# Patient Record
Sex: Female | Born: 1935 | Race: White | Hispanic: No | Smoking: Former smoker
Health system: Southern US, Community
[De-identification: ages and names within clinical notes are randomized; demographics above are authoritative.]

## PROBLEM LIST (undated history)

## (undated) DIAGNOSIS — H409 Unspecified glaucoma: Secondary | ICD-10-CM

## (undated) DIAGNOSIS — B029 Zoster without complications: Secondary | ICD-10-CM

## (undated) DIAGNOSIS — H269 Unspecified cataract: Secondary | ICD-10-CM

## (undated) DIAGNOSIS — M8580 Other specified disorders of bone density and structure, unspecified site: Secondary | ICD-10-CM

## (undated) DIAGNOSIS — M4800 Spinal stenosis, site unspecified: Secondary | ICD-10-CM

## (undated) DIAGNOSIS — I10 Essential (primary) hypertension: Secondary | ICD-10-CM

## (undated) DIAGNOSIS — E039 Hypothyroidism, unspecified: Secondary | ICD-10-CM

## (undated) DIAGNOSIS — H3530 Unspecified macular degeneration: Secondary | ICD-10-CM

## (undated) DIAGNOSIS — E785 Hyperlipidemia, unspecified: Secondary | ICD-10-CM

## (undated) DIAGNOSIS — M1990 Unspecified osteoarthritis, unspecified site: Secondary | ICD-10-CM

## (undated) HISTORY — DX: Unspecified glaucoma: H40.9

## (undated) HISTORY — DX: Unspecified macular degeneration: H35.30

## (undated) HISTORY — DX: Zoster without complications: B02.9

## (undated) HISTORY — PX: OTHER SURGICAL HISTORY: SHX169

## (undated) HISTORY — DX: Other specified disorders of bone density and structure, unspecified site: M85.80

## (undated) HISTORY — DX: Unspecified cataract: H26.9

## (undated) HISTORY — DX: Spinal stenosis, site unspecified: M48.00

## (undated) HISTORY — PX: TUBAL LIGATION: SHX77

## (undated) HISTORY — DX: Hyperlipidemia, unspecified: E78.5

## (undated) HISTORY — DX: Essential (primary) hypertension: I10

## (undated) HISTORY — PX: EYE SURGERY: SHX253

## (undated) HISTORY — PX: APPENDECTOMY: SHX54

---

## 1998-01-28 ENCOUNTER — Encounter: Payer: Self-pay | Admitting: Obstetrics and Gynecology

## 1998-01-28 ENCOUNTER — Ambulatory Visit (HOSPITAL_COMMUNITY): Admission: RE | Admit: 1998-01-28 | Discharge: 1998-01-28 | Payer: Self-pay | Admitting: Obstetrics and Gynecology

## 1998-02-19 ENCOUNTER — Other Ambulatory Visit: Admission: RE | Admit: 1998-02-19 | Discharge: 1998-02-19 | Payer: Self-pay | Admitting: Obstetrics and Gynecology

## 1999-03-23 ENCOUNTER — Encounter: Payer: Self-pay | Admitting: Obstetrics and Gynecology

## 1999-03-23 ENCOUNTER — Ambulatory Visit (HOSPITAL_COMMUNITY): Admission: RE | Admit: 1999-03-23 | Discharge: 1999-03-23 | Payer: Self-pay | Admitting: Obstetrics and Gynecology

## 1999-05-18 ENCOUNTER — Other Ambulatory Visit: Admission: RE | Admit: 1999-05-18 | Discharge: 1999-05-18 | Payer: Self-pay | Admitting: Obstetrics and Gynecology

## 1999-07-05 ENCOUNTER — Encounter (INDEPENDENT_AMBULATORY_CARE_PROVIDER_SITE_OTHER): Payer: Self-pay | Admitting: Specialist

## 1999-07-05 ENCOUNTER — Ambulatory Visit (HOSPITAL_COMMUNITY): Admission: RE | Admit: 1999-07-05 | Discharge: 1999-07-05 | Payer: Self-pay | Admitting: Obstetrics and Gynecology

## 2000-01-05 ENCOUNTER — Encounter
Admission: RE | Admit: 2000-01-05 | Discharge: 2000-01-23 | Payer: Self-pay | Admitting: Physical Medicine and Rehabilitation

## 2000-05-01 ENCOUNTER — Encounter: Payer: Self-pay | Admitting: Obstetrics and Gynecology

## 2000-05-01 ENCOUNTER — Ambulatory Visit (HOSPITAL_COMMUNITY): Admission: RE | Admit: 2000-05-01 | Discharge: 2000-05-01 | Payer: Self-pay | Admitting: Obstetrics and Gynecology

## 2000-06-27 ENCOUNTER — Other Ambulatory Visit: Admission: RE | Admit: 2000-06-27 | Discharge: 2000-06-27 | Payer: Self-pay | Admitting: Obstetrics and Gynecology

## 2001-06-04 ENCOUNTER — Encounter: Payer: Self-pay | Admitting: Family Medicine

## 2001-06-04 ENCOUNTER — Ambulatory Visit (HOSPITAL_COMMUNITY): Admission: RE | Admit: 2001-06-04 | Discharge: 2001-06-04 | Payer: Self-pay | Admitting: Family Medicine

## 2001-07-03 ENCOUNTER — Other Ambulatory Visit: Admission: RE | Admit: 2001-07-03 | Discharge: 2001-07-03 | Payer: Self-pay | Admitting: Family Medicine

## 2001-07-24 ENCOUNTER — Other Ambulatory Visit: Admission: RE | Admit: 2001-07-24 | Discharge: 2001-07-24 | Payer: Self-pay | Admitting: General Surgery

## 2001-07-26 ENCOUNTER — Ambulatory Visit (HOSPITAL_COMMUNITY): Admission: RE | Admit: 2001-07-26 | Discharge: 2001-07-26 | Payer: Self-pay | Admitting: *Deleted

## 2001-09-16 ENCOUNTER — Encounter: Payer: Self-pay | Admitting: General Surgery

## 2001-09-18 ENCOUNTER — Encounter (INDEPENDENT_AMBULATORY_CARE_PROVIDER_SITE_OTHER): Payer: Self-pay | Admitting: Specialist

## 2001-09-18 ENCOUNTER — Ambulatory Visit (HOSPITAL_COMMUNITY): Admission: RE | Admit: 2001-09-18 | Discharge: 2001-09-19 | Payer: Self-pay | Admitting: General Surgery

## 2002-06-27 ENCOUNTER — Ambulatory Visit (HOSPITAL_COMMUNITY): Admission: RE | Admit: 2002-06-27 | Discharge: 2002-06-27 | Payer: Self-pay | Admitting: Family Medicine

## 2002-06-27 ENCOUNTER — Encounter: Payer: Self-pay | Admitting: Family Medicine

## 2003-07-20 ENCOUNTER — Ambulatory Visit (HOSPITAL_COMMUNITY): Admission: RE | Admit: 2003-07-20 | Discharge: 2003-07-20 | Payer: Self-pay | Admitting: Family Medicine

## 2003-11-06 ENCOUNTER — Other Ambulatory Visit: Admission: RE | Admit: 2003-11-06 | Discharge: 2003-11-06 | Payer: Self-pay | Admitting: Family Medicine

## 2004-08-04 ENCOUNTER — Ambulatory Visit (HOSPITAL_COMMUNITY): Admission: RE | Admit: 2004-08-04 | Discharge: 2004-08-04 | Payer: Self-pay | Admitting: Family Medicine

## 2005-08-14 ENCOUNTER — Ambulatory Visit (HOSPITAL_COMMUNITY): Admission: RE | Admit: 2005-08-14 | Discharge: 2005-08-14 | Payer: Self-pay | Admitting: Family Medicine

## 2005-11-21 ENCOUNTER — Other Ambulatory Visit: Admission: RE | Admit: 2005-11-21 | Discharge: 2005-11-21 | Payer: Self-pay | Admitting: Family Medicine

## 2006-08-21 ENCOUNTER — Ambulatory Visit (HOSPITAL_COMMUNITY): Admission: RE | Admit: 2006-08-21 | Discharge: 2006-08-21 | Payer: Self-pay | Admitting: Family Medicine

## 2007-02-03 ENCOUNTER — Inpatient Hospital Stay (HOSPITAL_COMMUNITY): Admission: EM | Admit: 2007-02-03 | Discharge: 2007-02-12 | Payer: Self-pay | Admitting: Emergency Medicine

## 2007-10-01 ENCOUNTER — Ambulatory Visit (HOSPITAL_COMMUNITY): Admission: RE | Admit: 2007-10-01 | Discharge: 2007-10-01 | Payer: Self-pay | Admitting: Family Medicine

## 2008-10-07 ENCOUNTER — Ambulatory Visit (HOSPITAL_COMMUNITY): Admission: RE | Admit: 2008-10-07 | Discharge: 2008-10-07 | Payer: Self-pay | Admitting: Family Medicine

## 2009-10-14 ENCOUNTER — Ambulatory Visit (HOSPITAL_COMMUNITY): Admission: RE | Admit: 2009-10-14 | Discharge: 2009-10-14 | Payer: Self-pay | Admitting: Family Medicine

## 2010-07-26 NOTE — H&P (Signed)
[name], Wheeler NO.:  [id number]   MEDICAL RECORD NO.:  [id number]          PATIENT TYPE:  INP   LOCATION:  5731                         FACILITY:  [facility]   PHYSICIAN:  Nicole Sportsman, MD     DATE OF BIRTH:  10-03-1930   DATE OF ADMISSION:  02/03/2007  DATE OF DISCHARGE:                              HISTORY & PHYSICAL   PRIMARY CARE PHYSICIAN:  Nicole Wheeler, M.D.   REQUESTING PHYSICIAN:  Nicole Wheeler, M.D.   SURGEON:  Nicole Sportsman, MD   OTHER SURGEONS:  Nicole Wheeler at [facility] Wheeler  in [place], Nicole Wheeler.   REASON FOR VISIT:  Nausea, vomiting, probable bowel obstruction.   HISTORY OF PRESENT ILLNESS:  Nicole Wheeler is a 77 year old pleasant female  who on vacation had appendicitis and had a laparoscopic appendectomy by  Dr. Malena Edman on January 28, 2007.  There were a little bit of fecal  contents during surgery, but otherwise this was able to be done  laparoscopically.  She was able to be discharged the following day.  [place]   Since her discharge, she has had a few small bowel movements.  She  initially had some flatus the first few days after surgery, but has not  had any flatus for several days.  Her appetite has been fair and has not  improved.  She has had a little bit of nausea that has been relatively  mild.  However, her abdomen started feeling more uncomfortable with  increasing distention and she started having worsening nausea.  She  began to throw up and have severe pain and came to the emergency room.  She has been hydrated and NG tube placed after 3-way views were  concerning for a small bowel obstruction.   She denies any sick contacts or travel history.  She normally has a  bowel movement every day.  She has had a colonoscopy several years ago  that was completely negative.  No other history of any abdominal  surgeries.  She has never had  anything like this before.   PAST MEDICAL HISTORY:  1. Hypothyroidism, secondary to thyroidectomy for nodules.  2. Osteoporosis.   PAST SURGICAL HISTORY:  1. She had a laparoscopic appendectomy on January 28, 2007.  2. She had a left thyroidectomy in 2003.  3. She has had hysteroscopy in 2001.  She has not had any other      abdominal surgeries.   SOCIAL HISTORY:  No tobacco, alcohol or drug use.  She is in a stable  relationship.  [place] went [place]  vacation.   FAMILY HISTORY:  Family history is negative for any significant  cardiopulmonary or bowel disease or gastrointestinal disease that she  can recall.   ALLERGIES:  None.   MEDICATIONS:  1. Medications include Synthroid 125 mcg daily.  2. Evista daily.  3. Calcium supplements 600 mg daily.  4. Glucosamine daily.  5. Aspirin 81 mg daily.  6.  Multivitamin daily.  7. Fish oil daily.  8. She is also on Levaquin 500 mg daily and  9. Flagyl 500 mg p.o. q.6h.; apparently she was nearly completing      that.  She was also on some  10.Tylox p.r.n.   REVIEW OF SYSTEMS:  Noted as per HPI.  Otherwise, GENERAL:  No fevers,  chills, sweats.  Is otherwise negative.  Otologic, ENT, cardiac,  pulmonary, musculoskeletal, neurological, psychiatric, allergic, heme,  lymph, hepatic, renal are negative.  ENDOCRINE:  Hypothyroidism, on  Synthroid, stable status post resections.  Osteoporosis.  GI:  As noted  above.  No hematochezia or melena.  No dysphagia to solids; some early  satiety and bloating recently.  No severe heartburn or reflux.  BREAST/GYN:  Otherwise negative.   PHYSICAL EXAMINATION:  VITAL SIGNS:  Her temperature is 97.8, pulse 80,  90s.  Blood pressure is 150/84 with 10 out of 10 pain; now it is 126/56  with about 3 to 6 out of 10 pain, given narcotics and NG tube.  Respirations initially 20, now down to 12.  GENERAL:  She is a well-developed, well-nourished female in no acute   distress.  HEENT:  She is normocephalic with no facial asymmetry.  She has an NG  tube in place with obvious dark, bilious output.  Nasopharynx,  oropharynx otherwise clear.  Mucous membranes are dry.  NECK:  Supple, without any  masses.  Trachea is midline.  HEART:  Regular rate and rhythm, no murmurs, clicks or rubs.  CHEST:  Clear to auscultation bilaterally, no wheezes, rales or rhonchi.  No pain to rib or sternal compression.  ABDOMEN:  Moderately distended with well-healed laparoscopic incisions  in the umbilicus, suprapubic and left lower quadrant region.  There is  minimal ecchymosis and no evidence of any abscess or hernia.  She has  some mild tenderness, but certainly no peritonitis or guarding.  GENITALIA:  Normal external female genitalia, no inguinal hernias.  RECTAL:  Refused, per patient request.  EXTREMITIES:  No clubbing, cyanosis or edema.  MUSCULOSKELETAL:  Full range of motion shoulders, elbows, wrists as well  as hip, knees and ankles.  A little bit of stiffness at the knees.  PSYCHIATRIC:  No evidence of any dementia, delirium, psychosis or  paranoia. At least average intelligence and good insight.  NEUROLOGIC:  Cranial nerves II-XII are intact.  Hand grip is 5/5 and equal and  symmetrical.  BREASTS:  No obvious sores or lesion or nipple discharge.  LYMPH NODE:  No head, neck or axillary, groin lymphadenopathy.   STUDIES:  Her albumin is 2.5.  The rest of her electrolytes and liver  function tests and lipase are normal.  White count is 12.7 with slight  left shift with a hemoglobin of 12.9.  A 3-way of the abdomen shows no  pneumonia, but she has numerous dilated small bowel loops with air-fluid  levels, concerning for a bowel obstruction versus ileus.  CT scan is  pending.   ASSESSMENT/PLAN:  This is a 77 year old female postop day 6 laparoscopic  appendectomy with a little bit of fecal contamination but no definite  suppuration or abscess, with nausea and  vomiting and distention  concerning for postoperative ileus versus bowel obstruction.  1. Admit.  2. IV fluids.  3. Start TNA for malnutrition.  4. CT scan of the abdomen and pelvis to rule out an abscess and      evaluate for a bowel obstruction.  5. Serial abdominal examinations.  6. IV antibiotics, make sure she is not missing any other infections.  7. Continue nasogastric tube decompression.  8. Hydrate as needed.  9. Ulcer prophylaxis with proton pump inhibitor.  10.DVT prophylaxis with sequential compression devices.  11.Hold on active anticoagulation at this time.  12.Encourage early ambulation.   This plan was discussed with the patient and her husband in the presence  of the ER nurse and all agree with this plan.      Nicole Sportsman, MD  Electronically Signed     SCG/MEDQ  D:  02/03/2007  T:  02/03/2007  Job:  [id number]   cc:   Nicole B. Shon Baton, MD  Nicole Wheeler, M.D.

## 2010-07-26 NOTE — Discharge Summary (Signed)
NAMEHILIANA, Nicole Wheeler                ACCOUNT NO.:  [id number]   MEDICAL RECORD NO.:  [id number]          PATIENT TYPE:  INP   LOCATION:  5710                         FACILITY:  [facility]   PHYSICIAN:  Leonie Man, M.D.   DATE OF BIRTH:  [birth year]   DATE OF ADMISSION:  02/03/2007  DATE OF DISCHARGE:  02/12/2007                               DISCHARGE SUMMARY   PRIMARY CARE PHYSICIAN:  Dr. Rudi Heap.   OPERATIVE SURGEON:  Dr. Malena Edman at [facility] in [place], [place].   CHIEF COMPLAINT/REASON FOR ADMISSION:  The patient is a 77 year old  female patient who had acute appendicitis while on vacation in [place] and subsequently underwent laparoscopic appendectomy by Dr. Shon Baton  on November 17th.  According to the patient she states that the surgeon  told her she did have a minute amount of fecal contamination during the  surgery but otherwise had a successful surgery and recovery and was  subsequently discharged return back to [place].  Since surgery  the patient has had only a few small bowel movements.  No real flatus  over the past several days prior to her presentation, poor appetite and  nausea with increasing abdominal pain and subsequent development of  emesis several days prior to presenting to the ER.  When she presented  to the ER, she was afebrile, blood pressure was elevated but she was not  tachycardiac.  Her abdomen was distended with no bowel sounds.  Because  of her continued vomiting and ileus pattern on plain films, the ER  physician did have an NG tube placed in the patient prior to her being  evaluated by Dr. Michaell Cowing.  The patient's white count was mildly elevated  at 12,700 and a CT scan was ordered but was not resulted at time of  evaluation by Dr. Michaell Cowing.  Dr. Michaell Cowing admitted the patient with a  diagnosis of abdominal pain, leukocytosis and ileus postoperative after  laparoscopic appendectomy.   HOSPITAL  COURSE:  The patient was admitted to the general floor where  she was placed on n.p.o. status.  NG tube was continued.  IV fluid  hydration was initiated via PICC line as well as TNA was started and she  was placed on empiric Invanz for possible postoperative abscess  formation.   Subsequent CT scan did reveal dilated small bowel with a decompressed  terminal ileum consistent with partial small-bowel obstruction and  ileus.  She did have a very tiny abscess 2.1 x 1.3 cm in the upper right  pelvis with a tiny fecalith in place.  The patient was subsequently  evaluated by IR for percutaneous drainage of this area.  This area was  aspirated of a tiny amount of fluid.  A drain was not left in place and  these cultures were subsequently negative for any growth.   Over several days the patient's white count resolved.  She had  consistent problems though with abdominal distention and ileus requiring  an NG tube.  Morphine and Toradol were utilized for pain management  while the  NG tube was in place.  And she was also continued on TNA for  nutritional support while on bowel rest.   During this period of time while the patient was on bowel rest despite  abdominal distention, she was passing small amounts of flatus and small  bowel movements but she continued to have but as noted bilious output  and abnormal x-rays consistent with ileus with small bowel dilatation   By February 07, 2007 the patient's abdominal distention had improved.  She had developed bowel sounds.  Her NG tube was placed to straight  drain and she was ambulated and, by February 08, 2007, the patient was  passing flatus and having bowel movements after Dulcolax.  Her abdomen  was nondistended.  She had active bowel sounds.  Her NG tube was  discontinued and she was started on a clear liquid diet.  Over the next  several days the patient was bothered with feeling bloated and having  large volume watery diarrhea, so she kept  herself on a liquid diet  because she tolerated this better.  Stools were checked for C diff.  These were negative.  During this time period the patient had no  leukocytosis and no fever.  By February 10, 2007 the patient was  tolerating a full liquid diet and diarrhea was decreasing.  Her TNA was  discontinued.  Over the next several days the patient was advanced to a  solid diet which she tolerated.  By date of discharge her stools were  becoming more formed and less frequent stools.  Her Pincus Sanes was  discontinued 24 hours prior to discharge and she remained afebrile with  stable vital signs on the date of discharge.  Bowel sounds were present.  Her abdomen was only tender over the umbilical incision without any  evidence of seroma, drainage, redness or purulence.  Prealbumin was  checked.  Initial prealbumin was less than 10 and today's prealbumin on  discharge was 16.5.  The patient was otherwise deemed appropriate for  discharge home with plans to follow up with either Dr. Michaell Cowing or Dr. Carolynne Edouard  at [facility] and follow up with her primary care  physician as needed for her other chronic medical problems.   DISCHARGE DIAGNOSES:  1. Abdominal pain, nausea and vomiting secondary to postoperative      ileus.  2. Tiny postoperative abscess status post aspiration with no growth in      cultures.  3. Protein calorie malnutrition requiring TNA, resolving.  4. Stable hypothyroidism, on replacement medications.   DISCHARGE MEDICATIONS:  The patient will resume the following home  medications:  1. Synthroid 112 mcg daily.  2. Evista daily.  3. Calcium 600 mg daily.  4. Glucosamine daily.  5. Aspirin 81 mg daily.  6. Multiple vitamin daily.  7. Fish oil daily.  8. In addition, we have given her a prescription of Vicodin 5/325 one      to two tablets every 4 hours as needed for pain, number 30      dispensed with zero refills.   RETURN TO WORK:  Not applicable.   DIET:  No  restrictions.   WOUND CARE:  Not applicable.   ACTIVITY:  No restrictions on bathing or lifting.  She is two weeks  postoperative this point.  She has been instructed though not to drive  while taking any Vicodin.   FOLLOW-UP APPOINTMENTS:  She is to follow up with either Dr. Carolynne Edouard or Dr.  Michaell Cowing in  2-3 weeks.  She needs to call for an appointment at telephone  number is [phone].      Allison L. Rennis Harding, N.P.      Leonie Man, M.D.  Electronically Signed    ALE/MEDQ  D:  02/12/2007  T:  02/12/2007  Job:  [id number]   cc:   Ollen Gross. Vernell Morgans, M.D.  Ardeth Sportsman, MD  Ernestina Penna, M.D.  Dr. Malena Edman

## 2010-07-29 NOTE — Op Note (Signed)
[facility]  Patient:    Nicole Wheeler, Nicole Wheeler                       MRN: [id number] Proc. Date: 07/05/99 Adm. Date:  [id number] Attending:  Amanda Cockayne                           Operative Report  PREOPERATIVE DIAGNOSIS:       Thickened endometrium with spotting, negative hysterosonogram.  Patient is on Prempro.  POSTOPERATIVE DIAGNOSIS:      Thickened endometrium with spotting, negative hysterosonogram.  Patient is on Prempro.  OPERATION PERFORMED:          Hysteroscopy with observation scope and the resecting scope and resection of anterior and posterior endometrium.  SURGEON:                      Esmeralda Arthur, M.D.  ANESTHESIA:  DESCRIPTION OF PROCEDURE:     Patient was carried to the operating room and after satisfactory general anesthesia, she was placed in the lithotomy position.  She was prepped and draped as a sterile field and bladder was emptied by catheterization.  Examination revealed the uterus to feel like it was anterior.  I could feel no masses.  A weighted speculum was placed in the posterior vagina.  Cervix was grasped with a tenaculum.  The uterus was pulled forward and sounded to 8 cm.  We then dilated her to a #23 and the observation scope was inserted.  We could ell she had a thickened anterior wall, more than the posterior wall, but the posterior wall was thickened and there was a questionable fibroid on the posterolateral wall.  The cervix was then dilated to a #33, the resectoscope was inserted and we resected the anterior and posterior endometrium.  A good amount of tissue was obtained.  We then decreased the pressure until we thought we had bleeding under good control.   The patient was then watched for two minutes for excess bleeding and there was none.  The deficit was 80 cc.  The patient was carried to the recovery room in good condition. DD:  07/05/99 TD:  07/06/99 Job:  [id number]

## 2010-07-29 NOTE — Op Note (Signed)
. [facility]  Patient:    Nicole Wheeler, Nicole Wheeler Visit Number: [id number] MRN: [id number]          Service Type: DSU Location: 5700 [id number] Attending Physician:  Brandy Hale Dictated by:   Angelia Mould. Derrell Lolling, M.D. Proc. Date: 09/18/01 Admit Date:  09/18/2001 Discharge Date: 09/19/2001   CC:         Monica Becton, M.D.   Operative Report  PREOPERATIVE DIAGNOSIS:  Left thyroid mass.  POSTOPERATIVE DIAGNOSIS:  Hyperplastic nodule, left thyroid lobe.  OPERATION PERFORMED:  Left thyroid lobectomy with frozen section.  SURGEON:  Angelia Mould. Derrell Lolling, M.D.  ASSISTANT:  Sheppard Plumber. Earlene Plater, M.D.  ANESTHESIA:  INDICATIONS FOR PROCEDURE:  The patient is a 77 year old white female who noticed a painless lump in her left neck about three months ago.  She was evaluated.  A thyroid ultrasound was performed at [facility] in [place] and this shows a 3.8 x 2.3 x 2.0 cm nodule with hypoechoic areas suggesting a possible degenerating adenoma.  Tumor could not be excluded.  Her mother had a thyroidectomy for a benign goiter.  No familial history of endocrine neoplasia.  Exam reveals a 3 cm soft, smooth mobile mass in the left thyroid lobe, no adenopathy.  Fine needle aspiration cytology simply showed colloid and bloody fluid, only rare groups of follicular epithelium, no atypia. Thyroid function tests are normal.  Because of her age and the fact that she thinks this is growing, thyroidectomy was offered.  This was her preference and she is brought to the operating room electively.  DESCRIPTION OF PROCEDURE:  Following the induction of general endotracheal anesthesia, the patient was placed in a reversed Trendelenburg position with a roll behind her shoulders and the neck extended somewhat.  The neck was prepped and draped in sterile fashion.  Landmarks were identified.  A short transverse collar incision was made about 2 cm above the suprasternal  notch. Dissection was carried down through the subcutaneous tissue and the platysma. Skin flaps were elevated superiorly all the way to the thyroid cartilage and inferiorly to the suprasternal notch.  A self-retaining retractor was placed. The strap muscles were divided in the midline and retracted laterally.  With blunt dissection, we identified the left thyroid lobe and the large 3.5 cm nodule in the left thyroid lobe and mobilized this up.  This was smooth and firm but felt benign.  The right thyroid gland was mobilized and visualized and palpated and felt normal in size and texture.  I did not feel any nodules in the right thyroid lobe.  Retractors were placed.  We took down some of the attachments to the inferior pole anteriorly.  Small vessels were isolated with fine metal clips and divided and on occasion, 4-0 silk ties were used.  Dissection was carried outright on the capsule of the thyroid gland to avoid injury to parathyroid glands and to avoid injury the recurrent laryngeal nerve.  The middle thyroid vein and the inferior thyroid artery were isolated as they went onto the thyroid capsule and controlled with metal clips and divided.  The superior pole of the thyroid was not very well developed and in fact, the superior thyroid artery was very small.  I suspect that this was an anatomic variant. A very careful search was made for any ectopic thyroid on the left side and I did not find any.  We ultimately, mobilized the left thyroid lobe up onto the trachea without much difficulty at  all.  The dissection was fairly straight forward.  The recurrent laryngeal nerve was not directly visualized but the dissection was straightforward and we felt that we stayed away from it quite well.  We carried the dissection of the thyroid all the way across to the right anterior portion of the trachea where the isthmus was thinned.  We clamped the thyroid isthmus on the right side of the trachea  with two hemostats and divided the specimen.  The thyroid tissue then tied off with suture ligatures of 3-0 Vicryl.  A few small bleeders were controlled with 4-0 silk ties.  The wounds were irrigated and Surgicel gauze was placed.  The specimen was sent for frozen section.  Dr. Delila Spence performed frozen section and felt that this was a benign hyperplastic nodule.  We did not feel that any further resection was indicated.  We further inspected the wound, irrigating the wound out quite nicely and found that the wound was quite dry with no signs of bleeding whatsoever.  We placed Surgicel gauze in the bed of the left thyroid lobe.  The strap muscles were closed in the midline with interrupted sutures of 3-0 Vicryl.  The platysma muscle was closed with interrupted sutures of 3-0 Vicryl.  The skin was closed with Steri-Strips and a few skin staples.  Clean bandages were placed and the patient was taken to the recovery room in stable condition.  Estimated blood loss was about 30 to 40 cc.  Sponge and instrument counts were correct. Dictated by:   Angelia Mould. Derrell Lolling, M.D. Attending Physician:  Brandy Hale DD:  09/18/01 TD:  09/19/01 Job: [id number]

## 2010-09-13 ENCOUNTER — Other Ambulatory Visit: Payer: Self-pay | Admitting: Family Medicine

## 2010-09-13 DIAGNOSIS — Z1231 Encounter for screening mammogram for malignant neoplasm of breast: Secondary | ICD-10-CM

## 2010-10-18 ENCOUNTER — Ambulatory Visit (HOSPITAL_COMMUNITY)
Admission: RE | Admit: 2010-10-18 | Discharge: 2010-10-18 | Disposition: A | Discharge status: Home / Self Care | Payer: Medicare Other | Source: Ambulatory Visit | Attending: Family Medicine | Admitting: Family Medicine

## 2010-10-18 DIAGNOSIS — Z1231 Encounter for screening mammogram for malignant neoplasm of breast: Secondary | ICD-10-CM

## 2010-12-19 LAB — PHOSPHORUS: Phosphorus: 3

## 2010-12-19 LAB — COMPREHENSIVE METABOLIC PANEL
ALT: 46 — ABNORMAL HIGH
AST: 24
CO2: 28
Chloride: 109
Creatinine, Ser: 0.88
Sodium: 140
Total Bilirubin: 0.7
Total Protein: 5 — ABNORMAL LOW

## 2010-12-19 LAB — CBC
Hemoglobin: 10.5 — ABNORMAL LOW
MCV: 90.5
Platelets: 519 — ABNORMAL HIGH
RDW: 14.5

## 2010-12-19 LAB — DIFFERENTIAL
Basophils Absolute: 0.1
Basophils Relative: 1
Lymphocytes Relative: 19
Lymphs Abs: 1.5
Monocytes Relative: 9
Neutrophils Relative %: 69

## 2010-12-20 LAB — CHOLESTEROL, TOTAL: Cholesterol: 110

## 2010-12-20 LAB — COMPREHENSIVE METABOLIC PANEL
ALT: 12
BUN: 10
BUN: 12
Calcium: 7.7 — ABNORMAL LOW
Calcium: 7.9 — ABNORMAL LOW
Creatinine, Ser: 0.76
Glucose, Bld: 116 — ABNORMAL HIGH
Glucose, Bld: 159 — ABNORMAL HIGH
Sodium: 138
Total Protein: 4.6 — ABNORMAL LOW
Total Protein: 4.8 — ABNORMAL LOW

## 2010-12-20 LAB — CBC
HCT: 33.4 — ABNORMAL LOW
Hemoglobin: 11.1 — ABNORMAL LOW
Hemoglobin: 11.9 — ABNORMAL LOW
Hemoglobin: 12.9
MCHC: 33.2
MCHC: 33.4
Platelets: 378
Platelets: 383
RBC: 4.22
RDW: 14.3
RDW: 14.7
WBC: 12.6 — ABNORMAL HIGH
WBC: 12.7 — ABNORMAL HIGH

## 2010-12-20 LAB — DIFFERENTIAL
Basophils Relative: 0
Basophils Relative: 0
Lymphocytes Relative: 14
Lymphocytes Relative: 9 — ABNORMAL LOW
Lymphs Abs: 1.1
Lymphs Abs: 1.4
Monocytes Absolute: 1.2 — ABNORMAL HIGH
Monocytes Relative: 10
Monocytes Relative: 9
Neutro Abs: 10.4 — ABNORMAL HIGH
Neutro Abs: 7.3
Neutrophils Relative %: 74
Neutrophils Relative %: 82 — ABNORMAL HIGH

## 2010-12-20 LAB — CLOSTRIDIUM DIFFICILE EIA

## 2010-12-20 LAB — HEPATIC FUNCTION PANEL
Albumin: 2.5 — ABNORMAL LOW
Alkaline Phosphatase: 48
Indirect Bilirubin: 0.8
Total Bilirubin: 0.9
Total Protein: 5.6 — ABNORMAL LOW

## 2010-12-20 LAB — CULTURE, ROUTINE-ABSCESS: Culture: NO GROWTH

## 2010-12-20 LAB — POTASSIUM: Potassium: 3.8

## 2010-12-20 LAB — I-STAT 8, (EC8 V) (CONVERTED LAB)
Chloride: 102
Glucose, Bld: 126 — ABNORMAL HIGH
Hemoglobin: 14.3
Potassium: 3.3 — ABNORMAL LOW
Sodium: 141
TCO2: 34
pH, Ven: 7.478 — ABNORMAL HIGH

## 2010-12-20 LAB — BASIC METABOLIC PANEL
Calcium: 8 — ABNORMAL LOW
GFR calc Af Amer: >60
GFR calc non Af Amer: >60
Sodium: 141

## 2010-12-20 LAB — CREATININE, SERUM
Creatinine, Ser: 0.81
GFR calc non Af Amer: >60

## 2010-12-20 LAB — ANAEROBIC CULTURE

## 2010-12-20 LAB — TRIGLYCERIDES: Triglycerides: 131

## 2010-12-20 LAB — PREALBUMIN: Prealbumin: 10.5 — ABNORMAL LOW

## 2010-12-20 LAB — POCT I-STAT CREATININE: Operator id: 285491

## 2010-12-20 LAB — PHOSPHORUS
Phosphorus: 2.1 — ABNORMAL LOW
Phosphorus: 2.6

## 2010-12-20 LAB — LIPASE, BLOOD: Lipase: 24

## 2010-12-20 LAB — MAGNESIUM: Magnesium: 1.9

## 2011-04-26 DIAGNOSIS — H40009 Preglaucoma, unspecified, unspecified eye: Secondary | ICD-10-CM | POA: Diagnosis not present

## 2011-04-26 DIAGNOSIS — H2510 Age-related nuclear cataract, unspecified eye: Secondary | ICD-10-CM | POA: Diagnosis not present

## 2011-04-26 DIAGNOSIS — H538 Other visual disturbances: Secondary | ICD-10-CM | POA: Diagnosis not present

## 2011-05-01 DIAGNOSIS — H40009 Preglaucoma, unspecified, unspecified eye: Secondary | ICD-10-CM | POA: Diagnosis not present

## 2011-05-15 DIAGNOSIS — H40009 Preglaucoma, unspecified, unspecified eye: Secondary | ICD-10-CM | POA: Diagnosis not present

## 2011-06-08 DIAGNOSIS — E782 Mixed hyperlipidemia: Secondary | ICD-10-CM | POA: Diagnosis not present

## 2011-06-08 DIAGNOSIS — E785 Hyperlipidemia, unspecified: Secondary | ICD-10-CM | POA: Diagnosis not present

## 2011-06-08 DIAGNOSIS — I10 Essential (primary) hypertension: Secondary | ICD-10-CM | POA: Diagnosis not present

## 2011-06-08 DIAGNOSIS — R5381 Other malaise: Secondary | ICD-10-CM | POA: Diagnosis not present

## 2011-06-08 DIAGNOSIS — R5383 Other fatigue: Secondary | ICD-10-CM | POA: Diagnosis not present

## 2011-08-17 DIAGNOSIS — M545 Low back pain, unspecified: Secondary | ICD-10-CM | POA: Diagnosis not present

## 2011-08-17 DIAGNOSIS — R209 Unspecified disturbances of skin sensation: Secondary | ICD-10-CM | POA: Diagnosis not present

## 2011-08-17 DIAGNOSIS — M76899 Other specified enthesopathies of unspecified lower limb, excluding foot: Secondary | ICD-10-CM | POA: Diagnosis not present

## 2011-10-16 DIAGNOSIS — J029 Acute pharyngitis, unspecified: Secondary | ICD-10-CM | POA: Diagnosis not present

## 2011-11-07 DIAGNOSIS — R7989 Other specified abnormal findings of blood chemistry: Secondary | ICD-10-CM | POA: Diagnosis not present

## 2011-11-07 DIAGNOSIS — E785 Hyperlipidemia, unspecified: Secondary | ICD-10-CM | POA: Diagnosis not present

## 2011-11-07 DIAGNOSIS — E039 Hypothyroidism, unspecified: Secondary | ICD-10-CM | POA: Diagnosis not present

## 2011-11-07 DIAGNOSIS — R5383 Other fatigue: Secondary | ICD-10-CM | POA: Diagnosis not present

## 2011-11-07 DIAGNOSIS — R5381 Other malaise: Secondary | ICD-10-CM | POA: Diagnosis not present

## 2011-11-07 DIAGNOSIS — E559 Vitamin D deficiency, unspecified: Secondary | ICD-10-CM | POA: Diagnosis not present

## 2011-11-14 DIAGNOSIS — E785 Hyperlipidemia, unspecified: Secondary | ICD-10-CM | POA: Diagnosis not present

## 2011-11-14 DIAGNOSIS — E039 Hypothyroidism, unspecified: Secondary | ICD-10-CM | POA: Diagnosis not present

## 2011-11-17 ENCOUNTER — Other Ambulatory Visit: Payer: Self-pay | Admitting: Family Medicine

## 2011-11-17 DIAGNOSIS — Z1231 Encounter for screening mammogram for malignant neoplasm of breast: Secondary | ICD-10-CM

## 2011-11-21 DIAGNOSIS — M5430 Sciatica, unspecified side: Secondary | ICD-10-CM | POA: Diagnosis not present

## 2011-11-28 ENCOUNTER — Ambulatory Visit
Admission: RE | Admit: 2011-11-28 | Discharge: 2011-11-28 | Disposition: A | Discharge status: Home / Self Care | Payer: Medicare Other | Source: Ambulatory Visit | Attending: Family Medicine | Admitting: Family Medicine

## 2011-11-28 ENCOUNTER — Other Ambulatory Visit: Payer: Self-pay | Admitting: Family Medicine

## 2011-11-28 DIAGNOSIS — M545 Low back pain, unspecified: Secondary | ICD-10-CM | POA: Diagnosis not present

## 2011-11-28 DIAGNOSIS — M25559 Pain in unspecified hip: Secondary | ICD-10-CM | POA: Diagnosis not present

## 2011-11-28 DIAGNOSIS — M5380 Other specified dorsopathies, site unspecified: Secondary | ICD-10-CM | POA: Diagnosis not present

## 2011-11-30 DIAGNOSIS — M4310 Spondylolisthesis, site unspecified: Secondary | ICD-10-CM | POA: Diagnosis not present

## 2011-11-30 DIAGNOSIS — M5126 Other intervertebral disc displacement, lumbar region: Secondary | ICD-10-CM | POA: Diagnosis not present

## 2011-12-05 ENCOUNTER — Encounter (HOSPITAL_COMMUNITY): Payer: Self-pay | Admitting: Pharmacy Technician

## 2011-12-05 ENCOUNTER — Ambulatory Visit (HOSPITAL_COMMUNITY): Payer: Medicare Other

## 2011-12-05 ENCOUNTER — Other Ambulatory Visit: Payer: Self-pay | Admitting: Neurosurgery

## 2011-12-05 DIAGNOSIS — M5106 Intervertebral disc disorders with myelopathy, lumbar region: Secondary | ICD-10-CM | POA: Diagnosis not present

## 2011-12-05 DIAGNOSIS — M545 Low back pain, unspecified: Secondary | ICD-10-CM | POA: Diagnosis not present

## 2011-12-05 DIAGNOSIS — IMO0002 Reserved for concepts with insufficient information to code with codable children: Secondary | ICD-10-CM | POA: Diagnosis not present

## 2011-12-06 ENCOUNTER — Encounter (HOSPITAL_COMMUNITY)
Admission: RE | Admit: 2011-12-06 | Discharge: 2011-12-06 | Disposition: A | Discharge status: Home / Self Care | Payer: Medicare Other | Source: Ambulatory Visit | Attending: Neurosurgery | Admitting: Neurosurgery

## 2011-12-06 ENCOUNTER — Encounter (HOSPITAL_COMMUNITY): Payer: Self-pay

## 2011-12-06 HISTORY — DX: Hypothyroidism, unspecified: E03.9

## 2011-12-06 HISTORY — DX: Unspecified osteoarthritis, unspecified site: M19.90

## 2011-12-06 LAB — CBC
MCH: 30.8 pg (ref 26.0–34.0)
MCHC: 33.3 g/dL (ref 30.0–36.0)
MCV: 92.4 fL (ref 78.0–100.0)
Platelets: 224 10*3/uL (ref 150–400)
RBC: 4.61 MIL/uL (ref 3.87–5.11)

## 2011-12-06 LAB — BASIC METABOLIC PANEL
BUN: 21 mg/dL (ref 6–23)
CO2: 27 mEq/L (ref 19–32)
Calcium: 10.2 mg/dL (ref 8.4–10.5)
Glucose, Bld: 134 mg/dL — ABNORMAL HIGH (ref 70–99)
Sodium: 139 mEq/L (ref 135–145)

## 2011-12-06 MED ORDER — CEFAZOLIN SODIUM-DEXTROSE 2-3 GM-% IV SOLR
2.0000 g | INTRAVENOUS | Status: AC
  Administered 2011-12-07: 2 g via INTRAVENOUS
  Filled 2011-12-06: qty 50

## 2011-12-06 NOTE — Pre-Procedure Instructions (Signed)
20 Nicole Wheeler  12/06/2011   Your procedure is scheduled on:  12/07/11  Report to [facility] at 1pm  Call this number if you have problems the morning of surgery: [phone]   Remember:   Do not eat food:After Midnight.   Take these medicines the morning of surgery with A SIP OF WATER: **synthroid,robaxin,pain med.,evista*   Do not wear jewelry, make-up or nail polish.  Do not wear lotions, powders, or perfumes. You may wear deodorant.  Do not shave 48 hours prior to surgery. Men may shave face and neck.  Do not bring valuables to the hospital.  Contacts, dentures or bridgework may not be worn into surgery.  Leave suitcase in the car. After surgery it may be brought to your room.  For patients admitted to the hospital, checkout time is 11:00 AM the day of discharge.   Patients discharged the day of surgery will not be allowed to drive home.  Name and phone number of your driver: family  Special Instructions: Shower using CHG 2 nights before surgery and the night before surgery.  If you shower the day of surgery use CHG.  Use special wash - you have one bottle of CHG for all showers.  You should use approximately 1/3 of the bottle for each shower.   Please read over the following fact sheets that you were given: Pain Booklet, Coughing and Deep Breathing, MRSA Information and Surgical Site Infection Prevention

## 2011-12-07 ENCOUNTER — Encounter (HOSPITAL_COMMUNITY): Payer: Self-pay | Admitting: *Deleted

## 2011-12-07 ENCOUNTER — Encounter (HOSPITAL_COMMUNITY)
Admission: RE | Disposition: A | Discharge status: Home / Self Care | Payer: Self-pay | Source: Ambulatory Visit | Attending: Neurosurgery

## 2011-12-07 ENCOUNTER — Inpatient Hospital Stay (HOSPITAL_COMMUNITY)
Admission: RE | Admit: 2011-12-07 | Discharge: 2011-12-09 | DRG: 491 | Disposition: A | Discharge status: Home / Self Care | Payer: Medicare Other | Source: Ambulatory Visit | Attending: Neurosurgery | Admitting: Neurosurgery

## 2011-12-07 ENCOUNTER — Encounter (HOSPITAL_COMMUNITY): Payer: Self-pay | Admitting: Surgery

## 2011-12-07 ENCOUNTER — Encounter (HOSPITAL_COMMUNITY): Payer: Self-pay | Admitting: Certified Registered Nurse Anesthetist

## 2011-12-07 ENCOUNTER — Inpatient Hospital Stay (HOSPITAL_COMMUNITY): Payer: Medicare Other | Admitting: *Deleted

## 2011-12-07 ENCOUNTER — Inpatient Hospital Stay (HOSPITAL_COMMUNITY): Payer: Medicare Other

## 2011-12-07 DIAGNOSIS — M5126 Other intervertebral disc displacement, lumbar region: Principal | ICD-10-CM | POA: Diagnosis present

## 2011-12-07 DIAGNOSIS — IMO0002 Reserved for concepts with insufficient information to code with codable children: Secondary | ICD-10-CM | POA: Diagnosis not present

## 2011-12-07 DIAGNOSIS — Q762 Congenital spondylolisthesis: Secondary | ICD-10-CM | POA: Diagnosis not present

## 2011-12-07 DIAGNOSIS — Z87891 Personal history of nicotine dependence: Secondary | ICD-10-CM | POA: Diagnosis not present

## 2011-12-07 DIAGNOSIS — Z01812 Encounter for preprocedural laboratory examination: Secondary | ICD-10-CM

## 2011-12-07 DIAGNOSIS — Z23 Encounter for immunization: Secondary | ICD-10-CM

## 2011-12-07 DIAGNOSIS — M545 Low back pain, unspecified: Secondary | ICD-10-CM | POA: Diagnosis not present

## 2011-12-07 DIAGNOSIS — M519 Unspecified thoracic, thoracolumbar and lumbosacral intervertebral disc disorder: Secondary | ICD-10-CM | POA: Diagnosis not present

## 2011-12-07 DIAGNOSIS — M47816 Spondylosis without myelopathy or radiculopathy, lumbar region: Secondary | ICD-10-CM

## 2011-12-07 DIAGNOSIS — E039 Hypothyroidism, unspecified: Secondary | ICD-10-CM | POA: Diagnosis present

## 2011-12-07 DIAGNOSIS — M48061 Spinal stenosis, lumbar region without neurogenic claudication: Secondary | ICD-10-CM | POA: Diagnosis not present

## 2011-12-07 HISTORY — PX: LUMBAR LAMINECTOMY/DECOMPRESSION MICRODISCECTOMY: SHX5026

## 2011-12-07 SURGERY — LUMBAR LAMINECTOMY/DECOMPRESSION MICRODISCECTOMY 1 LEVEL
Anesthesia: General | Site: Back | Laterality: Right

## 2011-12-07 MED ORDER — HEMOSTATIC AGENTS (NO CHARGE) OPTIME
TOPICAL | Status: DC | PRN
  Administered 2011-12-07: 1 via TOPICAL

## 2011-12-07 MED ORDER — OXYCODONE HCL 5 MG PO TABS: 5.0000 mg | ORAL_TABLET | Freq: Once | ORAL | Status: DC | PRN

## 2011-12-07 MED ORDER — ATORVASTATIN CALCIUM 20 MG PO TABS
20.0000 mg | ORAL_TABLET | Freq: Every day | ORAL | Status: DC
  Filled 2011-12-07 (×2): qty 1

## 2011-12-07 MED ORDER — HYDROCODONE-ACETAMINOPHEN 5-325 MG PO TABS: 1.0000 | ORAL_TABLET | ORAL | Status: DC | PRN

## 2011-12-07 MED ORDER — MEPERIDINE HCL 25 MG/ML IJ SOLN: 6.2500 mg | INTRAMUSCULAR | Status: DC | PRN

## 2011-12-07 MED ORDER — DEXTROSE 5 % IV SOLN
INTRAVENOUS | Status: DC | PRN
  Administered 2011-12-07: 16:00:00 via INTRAVENOUS

## 2011-12-07 MED ORDER — MORPHINE SULFATE 2 MG/ML IJ SOLN
1.0000 mg | INTRAMUSCULAR | Status: DC | PRN
  Administered 2011-12-08: 2 mg via INTRAVENOUS
  Filled 2011-12-07: qty 1

## 2011-12-07 MED ORDER — SODIUM CHLORIDE 0.9 % IR SOLN
Status: DC | PRN
  Administered 2011-12-07: 16:00:00

## 2011-12-07 MED ORDER — ZOLPIDEM TARTRATE 5 MG PO TABS: 5.0000 mg | ORAL_TABLET | Freq: Every evening | ORAL | Status: DC | PRN

## 2011-12-07 MED ORDER — OXYCODONE HCL 5 MG/5ML PO SOLN: 5.0000 mg | Freq: Once | ORAL | Status: DC | PRN

## 2011-12-07 MED ORDER — PHENYLEPHRINE HCL 10 MG/ML IJ SOLN
INTRAMUSCULAR | Status: DC | PRN
  Administered 2011-12-07 (×3): 40 ug via INTRAVENOUS
  Administered 2011-12-07: 80 ug via INTRAVENOUS
  Administered 2011-12-07 (×5): 40 ug via INTRAVENOUS

## 2011-12-07 MED ORDER — THROMBIN 5000 UNITS EX SOLR
CUTANEOUS | Status: DC | PRN
  Administered 2011-12-07 (×2): 5000 [IU] via TOPICAL

## 2011-12-07 MED ORDER — ACETAMINOPHEN 650 MG RE SUPP: 650.0000 mg | RECTAL | Status: DC | PRN

## 2011-12-07 MED ORDER — MIDAZOLAM HCL 5 MG/5ML IJ SOLN
INTRAMUSCULAR | Status: DC | PRN
  Administered 2011-12-07 (×2): 1 mg via INTRAVENOUS

## 2011-12-07 MED ORDER — FENTANYL CITRATE 0.05 MG/ML IJ SOLN
INTRAMUSCULAR | Status: DC | PRN
  Administered 2011-12-07: 100 ug via INTRAVENOUS
  Administered 2011-12-07: 50 ug via INTRAVENOUS
  Administered 2011-12-07: 100 ug via INTRAVENOUS

## 2011-12-07 MED ORDER — PROPOFOL 10 MG/ML IV BOLUS
INTRAVENOUS | Status: DC | PRN
  Administered 2011-12-07: 30 mg via INTRAVENOUS
  Administered 2011-12-07: 130 mg via INTRAVENOUS
  Administered 2011-12-07: 40 mg via INTRAVENOUS

## 2011-12-07 MED ORDER — MENTHOL 3 MG MT LOZG: 1.0000 | LOZENGE | OROMUCOSAL | Status: DC | PRN

## 2011-12-07 MED ORDER — RALOXIFENE HCL 60 MG PO TABS
60.0000 mg | ORAL_TABLET | Freq: Every day | ORAL | Status: DC
  Administered 2011-12-08 – 2011-12-09 (×2): 60 mg via ORAL
  Filled 2011-12-07 (×2): qty 1

## 2011-12-07 MED ORDER — ROCURONIUM BROMIDE 100 MG/10ML IV SOLN
INTRAVENOUS | Status: DC | PRN
  Administered 2011-12-07: 50 mg via INTRAVENOUS

## 2011-12-07 MED ORDER — LIDOCAINE HCL (CARDIAC) 20 MG/ML IV SOLN
INTRAVENOUS | Status: DC | PRN
  Administered 2011-12-07: 100 mg via INTRAVENOUS

## 2011-12-07 MED ORDER — VECURONIUM BROMIDE 10 MG IV SOLR
INTRAVENOUS | Status: DC | PRN
  Administered 2011-12-07 (×2): 1 mg via INTRAVENOUS

## 2011-12-07 MED ORDER — NEOSTIGMINE METHYLSULFATE 1 MG/ML IJ SOLN
INTRAMUSCULAR | Status: DC | PRN
  Administered 2011-12-07: 4 mg via INTRAVENOUS

## 2011-12-07 MED ORDER — DIAZEPAM 2 MG PO TABS
2.0000 mg | ORAL_TABLET | Freq: Four times a day (QID) | ORAL | Status: DC | PRN
  Administered 2011-12-07 – 2011-12-08 (×2): 2 mg via ORAL
  Filled 2011-12-07 (×2): qty 1

## 2011-12-07 MED ORDER — GLYCOPYRROLATE 0.2 MG/ML IJ SOLN
INTRAMUSCULAR | Status: DC | PRN
  Administered 2011-12-07: 0.6 mg via INTRAVENOUS

## 2011-12-07 MED ORDER — ACETAMINOPHEN 325 MG PO TABS: 650.0000 mg | ORAL_TABLET | ORAL | Status: DC | PRN

## 2011-12-07 MED ORDER — DOCUSATE SODIUM 100 MG PO CAPS
100.0000 mg | ORAL_CAPSULE | Freq: Two times a day (BID) | ORAL | Status: DC
  Administered 2011-12-07 – 2011-12-09 (×4): 100 mg via ORAL
  Filled 2011-12-07 (×4): qty 1

## 2011-12-07 MED ORDER — LACTATED RINGERS IV SOLN
INTRAVENOUS | Status: DC | PRN
  Administered 2011-12-07 (×2): via INTRAVENOUS

## 2011-12-07 MED ORDER — BACITRACIN 50000 UNITS IM SOLR
INTRAMUSCULAR | Status: AC
  Filled 2011-12-07: qty 1

## 2011-12-07 MED ORDER — ONDANSETRON HCL 4 MG/2ML IJ SOLN: 4.0000 mg | INTRAMUSCULAR | Status: DC | PRN

## 2011-12-07 MED ORDER — HYDROMORPHONE HCL PF 1 MG/ML IJ SOLN: 0.2500 mg | INTRAMUSCULAR | Status: DC | PRN

## 2011-12-07 MED ORDER — ONDANSETRON HCL 4 MG/2ML IJ SOLN
INTRAMUSCULAR | Status: DC | PRN
  Administered 2011-12-07: 4 mg via INTRAVENOUS

## 2011-12-07 MED ORDER — CEFAZOLIN SODIUM-DEXTROSE 2-3 GM-% IV SOLR
2.0000 g | Freq: Three times a day (TID) | INTRAVENOUS | Status: AC
  Administered 2011-12-07 – 2011-12-08 (×2): 2 g via INTRAVENOUS
  Filled 2011-12-07 (×2): qty 50

## 2011-12-07 MED ORDER — ONDANSETRON HCL 4 MG/2ML IJ SOLN: 4.0000 mg | Freq: Once | INTRAMUSCULAR | Status: DC | PRN

## 2011-12-07 MED ORDER — LACTATED RINGERS IV SOLN: INTRAVENOUS | Status: DC

## 2011-12-07 MED ORDER — LEVOTHYROXINE SODIUM 100 MCG PO TABS
100.0000 ug | ORAL_TABLET | Freq: Every day | ORAL | Status: DC
  Administered 2011-12-08 – 2011-12-09 (×2): 100 ug via ORAL
  Filled 2011-12-07 (×3): qty 1

## 2011-12-07 MED ORDER — SODIUM CHLORIDE 0.9 % IV SOLN
INTRAVENOUS | Status: AC
  Filled 2011-12-07: qty 500

## 2011-12-07 MED ORDER — BUPIVACAINE-EPINEPHRINE PF 0.5-1:200000 % IJ SOLN
INTRAMUSCULAR | Status: DC | PRN
  Administered 2011-12-07: 7 mL
  Administered 2011-12-07: 10 mL

## 2011-12-07 MED ORDER — OXYCODONE-ACETAMINOPHEN 5-325 MG PO TABS
1.0000 | ORAL_TABLET | ORAL | Status: DC | PRN
  Administered 2011-12-07 – 2011-12-09 (×9): 1 via ORAL
  Filled 2011-12-07 (×9): qty 1

## 2011-12-07 MED ORDER — 0.9 % SODIUM CHLORIDE (POUR BTL) OPTIME
TOPICAL | Status: DC | PRN
  Administered 2011-12-07: 1000 mL

## 2011-12-07 MED ORDER — PHENOL 1.4 % MT LIQD: 1.0000 | OROMUCOSAL | Status: DC | PRN

## 2011-12-07 SURGICAL SUPPLY — 55 items
APL SKNCLS STERI-STRIP NONHPOA (GAUZE/BANDAGES/DRESSINGS) ×1
BAG DECANTER FOR FLEXI CONT (MISCELLANEOUS) ×2 IMPLANT
BENZOIN TINCTURE PRP APPL 2/3 (GAUZE/BANDAGES/DRESSINGS) ×2 IMPLANT
BLADE SURG ROTATE 9660 (MISCELLANEOUS) IMPLANT
BRUSH SCRUB EZ PLAIN DRY (MISCELLANEOUS) ×2 IMPLANT
BUR ACORN 6.0 (BURR) ×2 IMPLANT
BUR MATCHSTICK NEURO 3.0 LAGG (BURR) ×2 IMPLANT
CANISTER SUCTION 2500CC (MISCELLANEOUS) ×2 IMPLANT
CLOTH BEACON ORANGE TIMEOUT ST (SAFETY) ×2 IMPLANT
CONT SPEC 4OZ CLIKSEAL STRL BL (MISCELLANEOUS) ×2 IMPLANT
DRAPE LAPAROTOMY 100X72X124 (DRAPES) ×2 IMPLANT
DRAPE MICROSCOPE LEICA (MISCELLANEOUS) ×2 IMPLANT
DRAPE POUCH INSTRU U-SHP 10X18 (DRAPES) ×2 IMPLANT
DRAPE SURG 17X23 STRL (DRAPES) ×8 IMPLANT
ELECT BLADE 4.0 EZ CLEAN MEGAD (MISCELLANEOUS) ×2
ELECT REM PT RETURN 9FT ADLT (ELECTROSURGICAL) ×2
ELECTRODE BLDE 4.0 EZ CLN MEGD (MISCELLANEOUS) ×1 IMPLANT
ELECTRODE REM PT RTRN 9FT ADLT (ELECTROSURGICAL) ×1 IMPLANT
GAUZE SPONGE 4X4 16PLY XRAY LF (GAUZE/BANDAGES/DRESSINGS) IMPLANT
GLOVE BIO SURGEON STRL SZ 6.5 (GLOVE) ×2 IMPLANT
GLOVE BIO SURGEON STRL SZ8 (GLOVE) ×2 IMPLANT
GLOVE BIO SURGEON STRL SZ8.5 (GLOVE) ×2 IMPLANT
GLOVE BIOGEL PI IND STRL 7.0 (GLOVE) IMPLANT
GLOVE BIOGEL PI INDICATOR 7.0 (GLOVE) ×1
GLOVE EXAM NITRILE LRG STRL (GLOVE) IMPLANT
GLOVE EXAM NITRILE MD LF STRL (GLOVE) ×1 IMPLANT
GLOVE EXAM NITRILE XL STR (GLOVE) IMPLANT
GLOVE EXAM NITRILE XS STR PU (GLOVE) IMPLANT
GLOVE SS BIOGEL STRL SZ 8 (GLOVE) ×1 IMPLANT
GLOVE SUPERSENSE BIOGEL SZ 8 (GLOVE) ×2
GOWN BRE IMP SLV AUR LG STRL (GOWN DISPOSABLE) ×1 IMPLANT
GOWN BRE IMP SLV AUR XL STRL (GOWN DISPOSABLE) ×3 IMPLANT
GOWN STRL REIN 2XL LVL4 (GOWN DISPOSABLE) IMPLANT
KIT BASIN OR (CUSTOM PROCEDURE TRAY) ×2 IMPLANT
KIT ROOM TURNOVER OR (KITS) ×2 IMPLANT
NDL HYPO 21X1.5 SAFETY (NEEDLE) IMPLANT
NEEDLE HYPO 21X1.5 SAFETY (NEEDLE) IMPLANT
NEEDLE HYPO 22GX1.5 SAFETY (NEEDLE) ×3 IMPLANT
NS IRRIG 1000ML POUR BTL (IV SOLUTION) ×2 IMPLANT
PACK LAMINECTOMY NEURO (CUSTOM PROCEDURE TRAY) ×2 IMPLANT
PAD ARMBOARD 7.5X6 YLW CONV (MISCELLANEOUS) ×6 IMPLANT
PATTIES SURGICAL .5 X1 (DISPOSABLE) IMPLANT
RUBBERBAND STERILE (MISCELLANEOUS) ×4 IMPLANT
SPONGE GAUZE 4X4 12PLY (GAUZE/BANDAGES/DRESSINGS) ×2 IMPLANT
SPONGE SURGIFOAM ABS GEL SZ50 (HEMOSTASIS) ×2 IMPLANT
STRIP CLOSURE SKIN 1/2X4 (GAUZE/BANDAGES/DRESSINGS) ×2 IMPLANT
SUT VIC AB 1 CT1 18XBRD ANBCTR (SUTURE) ×1 IMPLANT
SUT VIC AB 1 CT1 8-18 (SUTURE) ×2
SUT VIC AB 2-0 CP2 18 (SUTURE) ×2 IMPLANT
SYR 20CC LL (SYRINGE) IMPLANT
SYR 20ML ECCENTRIC (SYRINGE) ×2 IMPLANT
TAPE CLOTH SURG 4X10 WHT LF (GAUZE/BANDAGES/DRESSINGS) ×1 IMPLANT
TOWEL OR 17X24 6PK STRL BLUE (TOWEL DISPOSABLE) ×2 IMPLANT
TOWEL OR 17X26 10 PK STRL BLUE (TOWEL DISPOSABLE) ×2 IMPLANT
WATER STERILE IRR 1000ML POUR (IV SOLUTION) ×2 IMPLANT

## 2011-12-07 NOTE — Preoperative (Signed)
Beta Blockers   Reason not to administer Beta Blockers:Not Applicable 

## 2011-12-07 NOTE — Anesthesia Preprocedure Evaluation (Addendum)
Anesthesia Evaluation  Patient identified by MRN, date of birth, ID band Patient awake    Reviewed: Allergy & Precautions, H&P , NPO status , Patient's Chart, lab work & pertinent test results, reviewed documented beta blocker date and time   Airway Mallampati: II TM Distance: >3 FB Neck ROM: Full    Dental  (+) Teeth Intact and Dental Advisory Given   Pulmonary          Cardiovascular     Neuro/Psych    GI/Hepatic   Endo/Other  Hypothyroidism   Renal/GU      Musculoskeletal   Abdominal   Peds  Hematology   Anesthesia Other Findings   Reproductive/Obstetrics                          Anesthesia Physical Anesthesia Plan  ASA: II  Anesthesia Plan: General   Post-op Pain Management:    Induction: Intravenous  Airway Management Planned: Oral ETT  Additional Equipment:   Intra-op Plan:   Post-operative Plan: Extubation in OR  Informed Consent: I have reviewed the patients History and Physical, chart, labs and discussed the procedure including the risks, benefits and alternatives for the proposed anesthesia with the patient or authorized representative who has indicated his/her understanding and acceptance.     Plan Discussed with: CRNA and Surgeon  Anesthesia Plan Comments:         Anesthesia Quick Evaluation

## 2011-12-07 NOTE — Progress Notes (Signed)
Patient ID: Nicole Wheeler, female   DOB: 20-Aug-1930, 78 y.o.   MRN: [id number] Subjective:  The patient is alert and pleasant. She looks well. She tells me her leg feels much better since surgery.  Objective: Vital signs in last 24 hours: Temp:  [97.2 F (36.2 C)-97.9 F (36.6 C)] 97.2 F (36.2 C) (09/26 1726) Pulse Rate:  [69] 69  (09/26 1226) Resp:  [18] 18  (09/26 1226) BP: (158)/(70) 158/70 mmHg (09/26 1226) SpO2:  [98 %] 98 % (09/26 1226)  Intake/Output from previous day:   Intake/Output this shift: Total I/O In: 2050 [I.V.:2050] Out: 100 [Blood:100]  Physical exam the patient is alert and oriented. Her strength is grossly normal in all right lower extremity except she has some slight weakness in her right EHL/dorsiflexor. Unchanged from her preoperative status.  Lab Results:  Basename 12/06/11 1420  WBC 12.5*  HGB 14.2  HCT 42.6  PLT 224   BMET  Basename 12/06/11 1420  NA 139  K 4.1  CL 99  CO2 27  GLUCOSE 134*  BUN 21  CREATININE 0.91  CALCIUM 10.2    Studies/Results: Dg Lumbar Spine 1 View  12/07/2011  *RADIOLOGY REPORT*  Clinical Data: Right L5-S1 discectomy.  LUMBAR SPINE - 1 VIEW  Comparison: No priors.  Findings: Single intraoperative lateral view of the lumbar spine demonstrates a surgical probe in place posterior to the L4-L5 interspace.  A soft tissue retractor is noted, along with some surgical packing.  IMPRESSION: 1.  Intraoperative localization at the L4-L5 level, as above.   Original Report Authenticated By: Florencia Reasons, M.D.     Assessment/Plan: The patient is doing well.  LOS: 0 days     Kamali Nephew D 12/07/2011, 5:44 PM

## 2011-12-07 NOTE — Anesthesia Procedure Notes (Signed)
Procedure Name: Intubation Date/Time: 12/07/2011 3:33 PM Performed by: Marni Griffon Pre-anesthesia Checklist: Patient identified, Emergency Drugs available, Suction available and Patient being monitored Patient Re-evaluated:Patient Re-evaluated prior to inductionOxygen Delivery Method: Circle system utilized Preoxygenation: Pre-oxygenation with 100% oxygen Intubation Type: IV induction Ventilation: Mask ventilation without difficulty Laryngoscope Size: Mac and 3 Grade View: Grade II Tube type: Oral Tube size: 7.5 mm Number of attempts: 1 Airway Equipment and Method: Stylet Placement Confirmation: ETT inserted through vocal cords under direct vision,  breath sounds checked- equal and bilateral and positive ETCO2 Secured at: 21 (cm at teeth) cm Tube secured with: Tape Dental Injury: Teeth and Oropharynx as per pre-operative assessment

## 2011-12-07 NOTE — Plan of Care (Signed)
Problem: Consults Goal: Diagnosis - Spinal Surgery Outcome: Completed/Met Date Met:  12/07/11 Lumbar Laminectomy (Complex)

## 2011-12-07 NOTE — Op Note (Signed)
Brief history: The patient is a 78 year old white female who has complained of back and right leg pain consistent with a lumbar radiculopathy. She has failed medical management and was worked up with a lumbar MRI which demonstrated a herniated disc at L5-S1 on the right. I discussed the various treatment options with the patient including surgery. She has weighed the risks, benefits, and alternatives surgery and decided proceed with a right L5-S1 discectomy.  Preoperative diagnosis: Right L5-S1 herniated disc, right L5 and right S1 radiculopathy, spinal stenosis, lumbago, spondylolisthesis  Postoperative diagnosis: The same  Procedure: Right L5-S1 Intervertebral discectomy using micro-dissection to decompress the right L5 as well as right S1 nerve roots  Surgeon: Dr. Delma Officer  Asst.: Dr. Marikay Alar  Anesthesia: Gen. endotracheal  Estimated blood loss: 75 cc  Drains: None  Complications: None  Description of procedure: The patient was brought to the operating room by the anesthesia team. General endotracheal anesthesia was induced. The patient was turned to the prone position on the Wilson frame. The patient's lumbosacral region was then prepared with Betadine scrub and Betadine solution. Sterile drapes were applied.  I then injected the area to be incised with Marcaine with epinephrine solution. I then used a scalpel to make a linear midline incision over the L5-S1 intervertebral disc space. I then used electrocautery to perform a right sided subperiosteal dissection exposing the spinous process and lamina of L5 and S1. We obtained intraoperative radiograph to confirm our location. I then inserted the [facility] retractor for exposure.  We then brought the operative microscope into the field. Under its magnification and illumination we completed the microdissection. I used a high-speed drill to perform a laminotomy at L5. I then used a Kerrison punches to widen the laminotomy and  complete the right L5 laminectomy. I removed the ligamentum flavum at L5-S1 and L4-5.Marland Kitchen We then used microdissection to free up the thecal sac and the right L5 and right S1 nerve root from the epidural tissue. I then used a Kerrison punch to perform a foraminotomy at about the right L5 and right S1 nerve root. We then using the nerve root retractor to gently retract the thecal sac  medially. This exposed the intervertebral disc. It had ruptured from the L5-S1 disc space and migrated in a cephalad direction and was compressing both the exiting L5 and S1 nerve roots. We identified the ruptured disc and remove it with the pituitary forceps.  I then palpated along the ventral surface of the thecal sac and along exit route of the right L5 and right S1 nerve root and noted that the neural structures were well decompressed. This completed the decompression.  We then obtained hemostasis using bipolar electrocautery. We irrigated the wound out with bacitracin solution. We then removed the retractor. We then reapproximated the patient's thoracolumbar fascia with interrupted #1 Vicryl suture. We then reapproximated the patient's subcutaneous tissue with interrupted 3-0 Vicryl suture. We then reapproximated patient's skin with Steri-Strips and benzoin. The was then coated with bacitracin ointment. The drapes were removed. The patient was subsequently returned to the supine position where they were extubated by the anesthesia team. The patient was then transported to the postanesthesia care unit in stable condition. All sponge instrument and needle counts were reportedly correct at the end of this case.

## 2011-12-07 NOTE — H&P (Signed)
Subjective: The patient is a 78 year old white female who has suffered from back and right leg pain. She has failed medical management and was worked up with a lumbar MRI. This demonstrated a herniated disc at L5-S1 on the right. I discussed the various treatment options with the patient including surgery. She has weighed the risks, benefits, and alternatives surgery and decided proceed with a right L5-S1 discectomy.   Past Medical History  Diagnosis Date  . Arthritis   . Hypothyroidism     dr don Christell Constant   pcp    Past Surgical History  Procedure Date  . Throidectomy   . Appendectomy     No Known Allergies  History  Substance Use Topics  . Smoking status: Former Games developer  . Smokeless tobacco: Not on file  . Alcohol Use: Yes     occ    History reviewed. No pertinent family history. Prior to Admission medications   Medication Sig Start Date End Date Taking? Authorizing Provider  aspirin EC 81 MG tablet Take 81 mg by mouth daily.   Yes Historical Provider, MD  CALCIUM PO Take 1 tablet by mouth 2 (two) times daily.   Yes Historical Provider, MD  Cholecalciferol 2000 UNITS TABS Take 2,000 Units by mouth daily.   Yes Historical Provider, MD  fish oil-omega-3 fatty acids 1000 MG capsule Take 1 capsule by mouth daily.   Yes Historical Provider, MD  GLUCOSAMINE-CHONDROITIN PO Take 2 tablets by mouth daily. Also containing 1000 units Vitamin D3 per tablet   Yes Historical Provider, MD  levothyroxine (SYNTHROID, LEVOTHROID) 100 MCG tablet Take 100 mcg by mouth daily.   Yes Historical Provider, MD  methocarbamol (ROBAXIN) 750 MG tablet Take 750 mg by mouth at bedtime.   Yes Historical Provider, MD  oxyCODONE-acetaminophen (PERCOCET/ROXICET) 5-325 MG per tablet Take 2 tablets by mouth every 4 (four) hours as needed. For pain   Yes Historical Provider, MD  raloxifene (EVISTA) 60 MG tablet Take 60 mg by mouth daily.   Yes Historical Provider, MD  rosuvastatin (CRESTOR) 10 MG tablet Take 5 mg by  mouth daily.   Yes Historical Provider, MD     Review of Systems  Positive ROS: As above  All other systems have been reviewed and were otherwise negative with the exception of those mentioned in the HPI and as above.  Objective: Vital signs in last 24 hours: Temp:  [97.9 F (36.6 C)] 97.9 F (36.6 C) (09/26 1226) Pulse Rate:  [69] 69  (09/26 1226) Resp:  [18] 18  (09/26 1226) BP: (158)/(70) 158/70 mmHg (09/26 1226) SpO2:  [98 %] 98 % (09/26 1226)  General Appearance: Alert, cooperative, no distress, appears stated age Head: Normocephalic, without obvious abnormality, atraumatic Eyes: PERRL, conjunctiva/corneas clear, EOM's intact, fundi benign, both eyes      Ears: Normal TM's and external ear canals, both ears Throat: Lips, mucosa, and tongue normal; teeth and gums normal Neck: Supple, symmetrical, trachea midline, no adenopathy; thyroid: No enlargement/tenderness/nodules; no carotid bruit or JVD Back: Symmetric, no curvature, ROM normal, no CVA tenderness Lungs: Clear to auscultation bilaterally, respirations unlabored Heart: Regular rate and rhythm, S1 and S2 normal, no murmur, rub or gallop Abdomen: Soft, non-tender, bowel sounds active all four quadrants, no masses, no organomegaly Extremities: Extremities normal, atraumatic, no cyanosis or edema Pulses: 2+ and symmetric all extremities Skin: Skin color, texture, turgor normal, no rashes or lesions  NEUROLOGIC:   Mental status: alert and oriented, no aphasia, good attention span, [employer]  ok Motor Exam - grossly normal the patient is weak in her right extensor hallucis longus and dorsiflexors. Sensory Exam - grossly normal Reflexes: Symmetric Coordination - grossly normal Gait - grossly normal Balance - grossly normal Cranial Nerves: I: smell Not tested  II: visual acuity  OS: Normal    OD: Normal   II: visual fields Full to confrontation  II: pupils Equal, round, reactive to light  III,VII:  ptosis None  III,IV,VI: extraocular muscles  Full ROM  V: mastication Normal  V: facial light touch sensation  Normal  V,VII: corneal reflex  Present  VII: facial muscle function - upper  Normal  VII: facial muscle function - lower Normal  VIII: hearing Not tested  IX: soft palate elevation  Normal  IX,X: gag reflex Present  XI: trapezius strength  5/5  XI: sternocleidomastoid strength 5/5  XI: neck flexion strength  5/5  XII: tongue strength  Normal    Data Review Lab Results  Component Value Date   WBC 12.5* 12/06/2011   HGB 14.2 12/06/2011   HCT 42.6 12/06/2011   MCV 92.4 12/06/2011   PLT 224 12/06/2011   Lab Results  Component Value Date   NA 139 12/06/2011   K 4.1 12/06/2011   CL 99 12/06/2011   CO2 27 12/06/2011   BUN 21 12/06/2011   CREATININE 0.91 12/06/2011   GLUCOSE 134* 12/06/2011   Lab Results  Component Value Date   INR 1.3 02/04/2007    Assessment/Plan: Right L5-S1 herniated discs, lumbar discopathy, lumbago: I discussed situation with patient and her children. I reviewed the MR scan with them and pointed out the abnormalities. We have discussed the various treatment options including a right L5-S1 discectomy. I have described the surgery to them. I've shown him surgical models. We have discussed the risks, benefits, alternatives and likelihood of achieving our goals with surgery. I have answered all the patient and her family's questions. She wants to proceed with the operation.   Draden Cottingham D 12/07/2011 3:04 PM

## 2011-12-07 NOTE — Anesthesia Postprocedure Evaluation (Signed)
  Anesthesia Post-op Note  Patient: Nicole Wheeler  Procedure(s) Performed: Procedure(s) (LRB) with comments: LUMBAR LAMINECTOMY/DECOMPRESSION MICRODISCECTOMY 1 LEVEL (Right) - Right Lumbar Five-Sacral One Diskectomy  Patient Location: PACU  Anesthesia Type: General  Level of Consciousness: awake and alert   Airway and Oxygen Therapy: Patient Spontanous Breathing and Patient connected to nasal cannula oxygen  Post-op Pain: none  Post-op Assessment: Post-op Vital signs reviewed  Post-op Vital Signs: Reviewed  Complications: No apparent anesthesia complications

## 2011-12-07 NOTE — Transfer of Care (Signed)
Immediate Anesthesia Transfer of Care Note  Patient: Nicole Wheeler  Procedure(s) Performed: Procedure(s) (LRB) with comments: LUMBAR LAMINECTOMY/DECOMPRESSION MICRODISCECTOMY 1 LEVEL (Right) - Right Lumbar Five-Sacral One Diskectomy  Patient Location: PACU  Anesthesia Type: General  Level of Consciousness: awake, alert , oriented and patient cooperative  Airway & Oxygen Therapy: Patient Spontanous Breathing and Patient connected to nasal cannula oxygen  Post-op Assessment: Report given to PACU RN, Post -op Vital signs reviewed and stable and Patient moving all extremities  Post vital signs: Reviewed and stable  Complications: No apparent anesthesia complications

## 2011-12-08 ENCOUNTER — Encounter (HOSPITAL_COMMUNITY): Payer: Self-pay | Admitting: Neurosurgery

## 2011-12-08 MED ORDER — OXYCODONE-ACETAMINOPHEN 5-325 MG PO TABS: 1.0000 | ORAL_TABLET | ORAL | Status: DC | PRN

## 2011-12-08 MED ORDER — DIAZEPAM 2 MG PO TABS: 2.0000 mg | ORAL_TABLET | Freq: Four times a day (QID) | ORAL | Status: DC | PRN

## 2011-12-08 MED ORDER — DSS 100 MG PO CAPS: 100.0000 mg | ORAL_CAPSULE | Freq: Two times a day (BID) | ORAL | Status: DC

## 2011-12-08 MED ORDER — INFLUENZA VIRUS VACC SPLIT PF IM SUSP
0.5000 mL | INTRAMUSCULAR | Status: AC
  Administered 2011-12-08: 0.5 mL via INTRAMUSCULAR
  Filled 2011-12-08: qty 0.5

## 2011-12-08 NOTE — Evaluation (Signed)
Occupational Therapy Evaluation Patient Details Name: Nicole Wheeler MRN: [id number] DOB: 07-31-1930 Today's Date: 12/08/2011 Time: 1016-     OT Assessment / Plan / Recommendation Clinical Impression  Pt. was ed. on use of AE and DME for ADL's and mobilty with back precuations. Pt. and dtr. were able to return demo and verbalize ability to follow back precuations and use equipment safely. No further OT required.     OT Assessment  Patient does not need any further OT services    Follow Up Recommendations  No OT follow up    Barriers to Discharge      Equipment Recommendations  None recommended by OT    Recommendations for Other Services    Frequency       Precautions / Restrictions Precautions Precautions: Back Precaution Booklet Issued: Yes (comment) Restrictions Weight Bearing Restrictions: No Other Position/Activity Restrictions: back   Pertinent Vitals/Pain     ADL  Eating/Feeding: Independent Where Assessed - Eating/Feeding: Edge of bed Grooming: Performed;Teeth care;Wash/dry hands;Wash/dry face;Min guard;Set up Where Assessed - Grooming: Supported standing Upper Body Bathing: Simulated;Supervision/safety Where Assessed - Upper Body Bathing: Unsupported sitting Lower Body Bathing: Simulated;Min guard Where Assessed - Lower Body Bathing: Unsupported sitting;Supported sit to stand Upper Body Dressing: Performed;Set up;Supervision/safety Where Assessed - Upper Body Dressing: Unsupported sitting Lower Body Dressing: Performed;Supervision/safety;Set up;Min guard (Min guard for standing.) Where Assessed - Lower Body Dressing: Unsupported sitting;Supported standing Toilet Transfer: Performed;Min guard Toilet Transfer Method: Stand pivot Acupuncturist: Comfort height toilet;Grab bars Toileting - Clothing Manipulation and Hygiene: Performed;Min guard Where Assessed - Glass blower/designer Manipulation and Hygiene: Standing ADL Comments: Pt. is able to don/doff  LE clothing with cross leg technique. Pt. was ed. on precuations for ADL's and provided handout. Pt. is S to [employer] assist with ADL's and mobility. Pt. was ed. on use of DME for toilet and shower and is going to borrow 3-1 commode. Pt and dtr. ed. on proper technique for sit to stand from chiar and commode. Pt. and dtr. ed. on safety with bathing, dressing, and toileting with back precuations.     OT Diagnosis:    OT Problem List:   OT Treatment Interventions:     OT Goals    Visit Information  Last OT Received On: 12/08/11 Assistance Needed: +1    Subjective Data  Subjective: Pt. agreeable to OT Patient Stated Goal: go home   Prior Functioning     Home Living Lives With: Alone Available Help at Discharge: Family;Available 24 hours/day Type of Home: House Home Access: Stairs to enter [employer] of Steps: 10 Entrance Stairs-Rails: Right Home Layout: Two level;Bed/bath upstairs Bathroom Shower/Tub: Walk-in Contractor: Standard Bathroom Accessibility: Yes How Accessible: Accessible via walker Home Adaptive Equipment: Grab bars in shower;Hand-held shower hose;Reacher;Built-in shower seat Additional Comments: Pt. is going to borrow 3-1 commode. Prior Function Level of Independence: Independent Able to Take Stairs?: Yes Driving: Yes Vocation: Retired Musician: No difficulties         Vision/Perception     Cognition  Overall Cognitive Status: Appears within functional limits for tasks assessed/performed Arousal/Alertness: Awake/alert Orientation Level: Appears intact for tasks assessed Behavior During Session: [facility] for tasks performed    Extremity/Trunk Assessment Right Upper Extremity Assessment RUE ROM/Strength/Tone: Within functional levels Left Upper Extremity Assessment LUE ROM/Strength/Tone: Within functional levels Right Lower Extremity Assessment RLE ROM/Strength/Tone: WFL for tasks assessed (pt with  right ankle pain) Left Lower Extremity Assessment LLE ROM/Strength/Tone: Within functional levels Trunk Assessment Trunk Assessment:  Normal     Mobility Bed Mobility Bed Mobility: Rolling Left;Left Sidelying to Sit;Sitting - Scoot to [employer] of Bed;Sit to Sidelying Left Rolling Left: 5: Supervision Left Sidelying to Sit: 5: Supervision;HOB flat Sitting - Scoot to Edge of Bed: 5: Supervision Sit to Sidelying Left: 5: Supervision;HOB flat Details for Bed Mobility Assistance: Pt. was S with bed mobility for supine to sit and sit to supine with back precautions. Transfers Transfers: Sit to Stand;Stand to Sit Sit to Stand: 5: Supervision;From bed;From toilet Stand to Sit: 5: Supervision;To toilet;To bed Details for Transfer Assistance: cueing for posture     Shoulder Instructions     Exercise     Balance     End of Session OT - End of Session Activity Tolerance: Patient tolerated treatment well Patient left: in bed;with call bell/phone within reach;with family/visitor present Nurse Communication: Precautions  GO     Jassiah Viviano 12/08/2011, 11:17 AM

## 2011-12-08 NOTE — Progress Notes (Signed)
Patient ID: Nicole Wheeler, female   DOB: Aug 09, 1930, 78 y.o.   MRN: [id number] Subjective:  The patient is alert and pleasant. She looks well. She wants to stay until tomorrow. Her leg pain has improved since surgery.  Objective: Vital signs in last 24 hours: Temp:  [97.2 F (36.2 C)-98.7 F (37.1 C)] 98.4 F (36.9 C) (09/27 0400) Pulse Rate:  [63-96] 76  (09/27 0400) Resp:  [14-37] 14  (09/27 0400) BP: (105-158)/(53-77) 137/77 mmHg (09/27 0400) SpO2:  [89 %-100 %] 97 % (09/27 0400) FiO2 (%):  [2 %] 2 % (09/26 1832)  Intake/Output from previous day: 09/26 0701 - 09/27 0700 In: 2050 [I.V.:2050] Out: 100 [Blood:100] Intake/Output this shift:    Physical exam the patient is alert and oriented. She is moving her lower extremities well.  Lab Results:  Basename 12/06/11 1420  WBC 12.5*  HGB 14.2  HCT 42.6  PLT 224   BMET  Basename 12/06/11 1420  NA 139  K 4.1  CL 99  CO2 27  GLUCOSE 134*  BUN 21  CREATININE 0.91  CALCIUM 10.2    Studies/Results: Dg Lumbar Spine 1 View  12/07/2011  *RADIOLOGY REPORT*  Clinical Data: Right L5-S1 discectomy.  LUMBAR SPINE - 1 VIEW  Comparison: No priors.  Findings: Single intraoperative lateral view of the lumbar spine demonstrates a surgical probe in place posterior to the L4-L5 interspace.  A soft tissue retractor is noted, along with some surgical packing.  IMPRESSION: 1.  Intraoperative localization at the L4-L5 level, as above.   Original Report Authenticated By: Florencia Reasons, M.D.     Assessment/Plan: Postop day 1: The patient is doing well. We will mobilize her with PT and OT. She will likely go home tomorrow. I gave her her discharge instructions. I answered all her questions.  LOS: 1 day     Elley Harp D 12/08/2011, 7:21 AM

## 2011-12-08 NOTE — Progress Notes (Signed)
Utilization review completed. Shem Plemmons, RN, BSN. 

## 2011-12-08 NOTE — Evaluation (Signed)
Physical Therapy Evaluation Patient Details Name: Nicole Wheeler MRN: [id number] DOB: Dec 12, 1930 Today's Date: 12/08/2011 Time: 4098-1191 PT Time Calculation (min): 24 min  PT Assessment / Plan / Recommendation Clinical Impression  Pt admitted with RLE pain and s/p L5-S1discectomy. Pt progressing well but limited by pain and will need to complete stairs prior to discharge. Dgtr present and planning to stay with pt at home for at least the next few days. Pt educated for precautions and mobility recommendations. Pt unable to tolerate sitting up end of session and returned to bed with RN providing pain meds. Pt will benefit from acute therapy to maximize mobility, gait, transfers and function prior to discharge.     PT Assessment  Patient needs continued PT services    Follow Up Recommendations  Home health PT    Barriers to Discharge None      Equipment Recommendations  None recommended by PT    Recommendations for Other Services     Frequency Min 5X/week    Precautions / Restrictions Precautions Precautions: Back Precaution Booklet Issued: Yes (comment)   Pertinent Vitals/Pain 5/10 right ankle pain progressing to 7/10 with ambulation, repositioned and RN aware      Mobility  Bed Mobility Bed Mobility: Rolling Left;Left Sidelying to Sit;Sitting - Scoot to [place] of Bed;Sit to Sidelying Left Rolling Left: 5: Supervision Left Sidelying to Sit: 5: Supervision;HOB flat Sitting - Scoot to Edge of Bed: 5: Supervision Sit to Sidelying Left: 5: Supervision;HOB flat Details for Bed Mobility Assistance: cueing for sequence and technique to maintain precautions Transfers Transfers: Sit to Stand;Stand to Sit Sit to Stand: 5: Supervision;From bed;From toilet Stand to Sit: 5: Supervision;To toilet;To bed Details for Transfer Assistance: cueing for posture Ambulation/Gait Ambulation/Gait Assistance: 5: Supervision Ambulation Distance (Feet): 150 Feet Assistive device: Rolling  walker Ambulation/Gait Assistance Details: decreased distance due to increasing ankle pain with activity Gait Pattern: Step-through pattern;Decreased stride length Gait velocity: decreased Stairs: No (went to stairs and pt unable to attempt due to pain)    Shoulder Instructions     Exercises     PT Diagnosis: Difficulty walking;Acute pain  PT Problem List: Decreased activity tolerance;Decreased mobility;Pain;Decreased knowledge of precautions PT Treatment Interventions: DME instruction;Gait training;Functional mobility training;Therapeutic activities;Patient/family education   PT Goals Acute Rehab PT Goals PT Goal Formulation: With patient Time For Goal Achievement: 12/15/11 Potential to Achieve Goals: Good Pt will go Supine/Side to Sit: with modified independence;with HOB 0 degrees PT Goal: Supine/Side to Sit - Progress: Goal set today Pt will go Sit to Supine/Side: with modified independence;with HOB 0 degrees PT Goal: Sit to Supine/Side - Progress: Goal set today Pt will go Sit to Stand: with modified independence PT Goal: Sit to Stand - Progress: Goal set today Pt will go Stand to Sit: with modified independence PT Goal: Stand to Sit - Progress: Goal set today Pt will Ambulate: >150 feet;with modified independence;with least restrictive assistive device PT Goal: Ambulate - Progress: Goal set today Pt will Go Up / Down Stairs: Flight;with supervision;with rail(s) PT Goal: Up/Down Stairs - Progress: Goal set today Additional Goals Additional Goal #1: Pt will independently state and demonstrate adherence to 3/3 back precautions PT Goal: Additional Goal #1 - Progress: Goal set today  Visit Information  Last PT Received On: 12/08/11 Assistance Needed: +1    Subjective Data  Subjective: I was just having so much pain in my right ankle Patient Stated Goal: return home   Prior Functioning  Home Living Lives With: Alone Available Help  at Discharge: Family;Available 24  hours/day Type of Home: House Home Access: Stairs to enter [employer] of Steps: 10 Entrance Stairs-Rails: Right Home Layout: Two level;Bed/bath upstairs Bathroom Shower/Tub: Engineer, manufacturing systems: Standard Home Adaptive Equipment: Walker - rolling;Hand-held shower hose Prior Function Level of Independence: Independent Able to Take Stairs?: Yes Driving: Yes Vocation: Retired Musician: No difficulties    Cognition  Overall Cognitive Status: Appears within functional limits for tasks assessed/performed Arousal/Alertness: Awake/alert Orientation Level: Appears intact for tasks assessed Behavior During Session: [facility] for tasks performed    Extremity/Trunk Assessment Right Lower Extremity Assessment RLE ROM/Strength/Tone: [facility] for tasks assessed (pt with right ankle pain) Left Lower Extremity Assessment LLE ROM/Strength/Tone: Within functional levels Trunk Assessment Trunk Assessment: Normal   Balance    End of Session PT - End of Session Activity Tolerance: Patient limited by pain Patient left: in bed;with call bell/phone within reach;with family/visitor present Nurse Communication: Mobility status  GP     Delorse Lek 12/08/2011, 8:06 AM  Delaney Meigs, PT [phone]

## 2011-12-09 NOTE — Progress Notes (Signed)
NCM spoke to pt and offered choice for [facility]. States she has no preference for [facility]. She has RW at home and no DME needed at this time. Family will assist her at home. Faxed orders for [facility] to [place]. NCM spoke to [facility]. Will start care on 9/30. Isidoro Donning RN CCM Case Mgmt phone [phone]

## 2011-12-09 NOTE — Progress Notes (Signed)
Physical Therapy Treatment Patient Details Name: Nicole Wheeler MRN: [id number] DOB: Oct 10, 1930 Today's Date: 12/09/2011 Time: 4696-2952 PT Time Calculation (min): 23 min  PT Assessment / Plan / Recommendation Comments on Treatment Session  Pt ambulated 150' with supervision, needed to use RW second half as pt felt nauseous and light-headed after stairs, though stairs went well and did not cause increased pain.  Recommended that pt use RW consistently for forst week after surgery for pain control with ambulation and spoke with nursing about med doasage as pt reports that her meds made her feel like this at home before surgery as well.  Recommend HHPT to f/u with pt for safety eval.  Pt has RW at home.  Also highly recommended that her daughter stay overnight at home with her the first 2 nights at least.  Will check back on her later this AM to make sure she has improved for d/c.    Follow Up Recommendations  Home health PT    Barriers to Discharge        Equipment Recommendations  None recommended by OT;None recommended by PT    Recommendations for Other Services    Frequency Min 5X/week   Plan Discharge plan remains appropriate;Frequency remains appropriate    Precautions / Restrictions Precautions Precautions: Back Precaution Booklet Issued: No Restrictions Weight Bearing Restrictions: No Other Position/Activity Restrictions: back   Pertinent Vitals/Pain 2/10 right distal leg pain, premedicated    Mobility  Bed Mobility Bed Mobility: Supine to Sit;Sit to Supine Supine to Sit: 6: Modified independent (Device/Increase time) Sit to Supine: 6: Modified independent (Device/Increase time) Transfers Transfers: Sit to Stand;Stand to Sit Sit to Stand: 6: Modified independent (Device/Increase time);From bed Stand to Sit: 6: Modified independent (Device/Increase time);To bed Ambulation/Gait Ambulation/Gait Assistance: 5: Supervision Ambulation Distance (Feet): 150 Feet Assistive  device: None;Rolling walker Ambulation/Gait Assistance Details: 1st half of walk pt used no assistive device, ambulated with antalgic pattern favoring RLE but no LOB and was not holding to rail in hallway. After performing stairs, pt became diaphoretic, nauseous, and light-headed, she used RW for return to room and immediately laid down. Gait Pattern: Step-through pattern;Decreased stride length;Decreased stance time - right;Antalgic Gait velocity: decreased Stairs: Yes Stairs Assistance: 5: Supervision Stairs Assistance Details (indicate cue type and reason): vc's for sequencing to control pain.  Pt reported that she felt OK doing stairs, took her time and paused after each step on the way up, came down facing the rail sideways.  Safe technique with stairs Stair Management Technique: One rail Right;Step to pattern;Forwards Number of Stairs: 5  Wheelchair Mobility Wheelchair Mobility: No    Exercises     PT Diagnosis:    PT Problem List:   PT Treatment Interventions:     PT Goals Acute Rehab PT Goals PT Goal Formulation: With patient Time For Goal Achievement: 12/15/11 Potential to Achieve Goals: Good Pt will go Supine/Side to Sit: with modified independence;with HOB 0 degrees PT Goal: Supine/Side to Sit - Progress: Met Pt will go Sit to Supine/Side: with modified independence;with HOB 0 degrees PT Goal: Sit to Supine/Side - Progress: Met Pt will go Sit to Stand: with modified independence PT Goal: Sit to Stand - Progress: Met Pt will go Stand to Sit: with modified independence PT Goal: Stand to Sit - Progress: Met Pt will Ambulate: >150 feet;with modified independence;with least restrictive assistive device PT Goal: Ambulate - Progress: Progressing toward goal Pt will Go Up / Down Stairs: Flight;with supervision;with rail(s) PT Goal: Up/Down Stairs -  Progress: Met Additional Goals Additional Goal #1: Pt will independently state and demonstrate adherence to 3/3 back  precautions PT Goal: Additional Goal #1 - Progress: Progressing toward goal  Visit Information  Last PT Received On: 12/09/11 Assistance Needed: +1    Subjective Data  Subjective: sometimes when I am sitting, the pain starts in my right hip Patient Stated Goal: return home   Cognition  Overall Cognitive Status: Appears within functional limits for tasks assessed/performed Arousal/Alertness: Awake/alert Orientation Level: Appears intact for tasks assessed Behavior During Session: Anxious Cognition - Other Comments: pt reports that the pain meds make her feel anxious and jumpy    Balance  Balance Balance Assessed: Yes Dynamic Standing Balance Dynamic Standing - Balance Support: No upper extremity supported;During functional activity Dynamic Standing - Level of Assistance: 5: Stand by assistance  End of Session PT - End of Session Equipment Utilized During Treatment: Gait belt Activity Tolerance: Other (comment) (pt limited by light-headedness) Patient left: in bed;with call bell/phone within reach;with nursing in room Nurse Communication: Other (comment) (pt feeling nauseous and light-headed)   GP   Lyanne Co, PT  Acute Rehab Services  [phone]   Lyanne Co 12/09/2011, 8:50 AM

## 2011-12-09 NOTE — Progress Notes (Signed)
12/09/11 1206  PT Visit Information  Last PT Received On 12/09/11  Assistance Needed +1  PT Time Calculation  PT Start Time 1100  PT Stop Time 1117  PT Time Calculation (min) 17 min  Subjective Data  Subjective I feel much better now  Patient Stated Goal return home  Precautions  Precautions Back  Precaution Booklet Issued No  Precaution Comments reviewed precautions with pt, she needed verbal cues to recall them.  Also discussed posture during daily living, choice of seating at home and while in car  Restrictions  Weight Bearing Restrictions No  Other Position/Activity Restrictions back  Cognition  Overall Cognitive Status Appears within functional limits for tasks assessed/performed  Arousal/Alertness Awake/alert  Orientation Level Appears intact for tasks assessed  Behavior During Session [facility] for tasks performed  Bed Mobility  Bed Mobility Supine to Sit;Sit to Supine  Rolling Left 6: Modified independent (Device/Increase time)  Left Sidelying to Sit 6: Modified independent (Device/Increase time);HOB flat  Sitting - Scoot to Edge of Bed 6: Modified independent (Device/Increase time)  Transfers  Transfers Sit to Stand;Stand to Sit  Sit to Stand 6: Modified independent (Device/Increase time)  Stand to Sit 6: Modified independent (Device/Increase time)  Ambulation/Gait  Ambulation/Gait Assistance 6: Modified independent (Device/Increase time)  Ambulation Distance (Feet) 200 Feet  Assistive device None  Ambulation/Gait Assistance Details pt ambulated better after she had been up for awhile, she was instructed to use RW when she first gets up in the morning regardless of how she feels and let HHPT help her decide when it is not needed.  Gait Pattern Step-through pattern;Decreased stride length;Decreased stance time - right;Antalgic  Gait velocity decreased  Stairs No  Wheelchair Mobility  Wheelchair Mobility No  Balance  Balance Assessed No  PT - End of Session  Activity  Tolerance Patient tolerated treatment well  Patient left in bed;with call bell/phone within reach;with family/visitor present  Nurse Communication Mobility status  PT - Assessment/Plan  Comments on Treatment Session Pt felt much better during second session and is ready for d/c.  Pt's daughter present for d/c and pt educated on d/c issues.  PT now signing off, defer to HHPT.    PT Plan All goals met and education completed, patient dischaged from PT services  Follow Up Recommendations Home health PT  Equipment Recommended None recommended by OT;None recommended by PT  Acute Rehab PT Goals  PT Goal Formulation With patient  Time For Goal Achievement 12/15/11  Potential to Achieve Goals Good  Pt will go Supine/Side to Sit with modified independence;with HOB 0 degrees  PT Goal: Supine/Side to Sit - Progress Met  Pt will go Sit to Supine/Side with modified independence;with HOB 0 degrees  PT Goal: Sit to Supine/Side - Progress Met  Pt will go Sit to Stand with modified independence  PT Goal: Sit to Stand - Progress Met  Pt will go Stand to Sit with modified independence  PT Goal: Stand to Sit - Progress Met  Pt will Ambulate >150 feet;with modified independence;with least restrictive assistive device  PT Goal: Ambulate - Progress Met  Pt will Go Up / Down Stairs Flight;with supervision;with rail(s)  PT Goal: Up/Down Stairs - Progress Met  Additional Goals  Additional Goal #1 Pt will independently state and demonstrate adherence to 3/3 back precautions  PT Goal: Additional Goal #1 - Progress Not met (continue with HHPT)  PT General Charges  $$ ACUTE PT VISIT 1 Procedure  PT Treatments  $Gait Training 8-22 mins  Juaquin Ludington, PT  Acute Rehab Services  [phone]  

## 2011-12-11 DIAGNOSIS — M545 Low back pain, unspecified: Secondary | ICD-10-CM | POA: Diagnosis not present

## 2011-12-11 DIAGNOSIS — R269 Unspecified abnormalities of gait and mobility: Secondary | ICD-10-CM | POA: Diagnosis not present

## 2011-12-11 DIAGNOSIS — M899 Disorder of bone, unspecified: Secondary | ICD-10-CM | POA: Diagnosis not present

## 2011-12-11 DIAGNOSIS — M6281 Muscle weakness (generalized): Secondary | ICD-10-CM | POA: Diagnosis not present

## 2011-12-11 DIAGNOSIS — IMO0001 Reserved for inherently not codable concepts without codable children: Secondary | ICD-10-CM | POA: Diagnosis not present

## 2011-12-11 DIAGNOSIS — Z4789 Encounter for other orthopedic aftercare: Secondary | ICD-10-CM | POA: Diagnosis not present

## 2011-12-11 DIAGNOSIS — I2510 Atherosclerotic heart disease of native coronary artery without angina pectoris: Secondary | ICD-10-CM | POA: Diagnosis not present

## 2011-12-11 NOTE — Discharge Summary (Signed)
Physician Discharge Summary  Patient ID: Nicole Wheeler MRN: [id number] DOB/AGE: 13-Feb-1931 78 y.o.  Admit date: 12/07/2011 Discharge date: 12/11/2011  Admission Diagnoses:Right L5-S1 herniated disc, right L5 and right S1 radiculopathy, spinal stenosis, lumbago, spondylolisthesis   Discharge Diagnoses:Right L5-S1 herniated disc, right L5 and right S1 radiculopathy, spinal stenosis, lumbago, spondylolisthesis      Active Problems:  * No active hospital problems. *    Discharged Condition: good  Hospital Course: [name] was admitted to the hospital and underwent an Right L5-S1 Intervertebral discectomy using micro-dissection to decompress the right L5 as well as right S1 nerve roots. Post op she has done well, is ambulating, tolerating a regular diet, and voiding. Wound is clean dry and without signs of infection.      Consults: None  Significant Diagnostic Studies: none  Treatments: surgery: Right L5-S1 Intervertebral discectomy using micro-dissection to decompress the right L5 as well as right S1 nerve roots      Discharge Exam: Blood pressure 109/52, pulse 79, temperature 98.4 F (36.9 C), temperature source Oral, resp. rate 18, SpO2 97.00%. General appearance: alert, cooperative and appears stated age Neurologic: Alert and oriented X 3, normal strength and tone. Normal symmetric reflexes. Normal coordination and gait  Disposition: 01-Home or Self Care  Discharge Orders    Future Appointments: Provider: Department: Dept Phone: Center:   01/29/2012 10:45 AM [facility] [phone]     Future Orders Please Complete By Expires   Ambulatory referral to Home Health      Comments:   Please evaluate [name] for admission to [facility].  Disciplines requested: Physical Therapy  Services to provide: Strengthening Exercises  Physician to follow patient's care (the person listed here will be responsible for signing ongoing orders): Referring  Provider  Requested Start of Care Date: Within 2-3 days  Special Instructions:       Medication List     As of 12/11/2011  5:58 PM    STOP taking these medications         methocarbamol 750 MG tablet   Commonly known as: ROBAXIN      TAKE these medications         aspirin EC 81 MG tablet   Take 81 mg by mouth daily.      CALCIUM PO   Take 1 tablet by mouth 2 (two) times daily.      Cholecalciferol 2000 UNITS Tabs   Take 2,000 Units by mouth daily.      diazepam 2 MG tablet   Commonly known as: VALIUM   Take 1 tablet (2 mg total) by mouth every 6 (six) hours as needed.      DSS 100 MG Caps   Take 100 mg by mouth 2 (two) times daily.      fish oil-omega-3 fatty acids 1000 MG capsule   Take 1 capsule by mouth daily.      GLUCOSAMINE-CHONDROITIN PO   Take 2 tablets by mouth daily. Also containing 1000 units Vitamin D3 per tablet      levothyroxine 100 MCG tablet   Commonly known as: SYNTHROID, LEVOTHROID   Take 100 mcg by mouth daily.      oxyCODONE-acetaminophen 5-325 MG per tablet   Commonly known as: PERCOCET/ROXICET   Take 2 tablets by mouth every 4 (four) hours as needed. For pain      oxyCODONE-acetaminophen 5-325 MG per tablet   Commonly known as: PERCOCET/ROXICET   Take 1-2 tablets by mouth every 4 (  four) hours as needed.      raloxifene 60 MG tablet   Commonly known as: EVISTA   Take 60 mg by mouth daily.      rosuvastatin 10 MG tablet   Commonly known as: CRESTOR   Take 5 mg by mouth daily.         Signed: Deonta Bomberger L 12/11/2011, 5:58 PM

## 2011-12-13 DIAGNOSIS — R269 Unspecified abnormalities of gait and mobility: Secondary | ICD-10-CM | POA: Diagnosis not present

## 2011-12-13 DIAGNOSIS — M545 Low back pain, unspecified: Secondary | ICD-10-CM | POA: Diagnosis not present

## 2011-12-13 DIAGNOSIS — Z4789 Encounter for other orthopedic aftercare: Secondary | ICD-10-CM | POA: Diagnosis not present

## 2011-12-13 DIAGNOSIS — I2510 Atherosclerotic heart disease of native coronary artery without angina pectoris: Secondary | ICD-10-CM | POA: Diagnosis not present

## 2011-12-13 DIAGNOSIS — M6281 Muscle weakness (generalized): Secondary | ICD-10-CM | POA: Diagnosis not present

## 2011-12-13 DIAGNOSIS — IMO0001 Reserved for inherently not codable concepts without codable children: Secondary | ICD-10-CM | POA: Diagnosis not present

## 2011-12-15 DIAGNOSIS — Z4789 Encounter for other orthopedic aftercare: Secondary | ICD-10-CM | POA: Diagnosis not present

## 2011-12-15 DIAGNOSIS — M545 Low back pain, unspecified: Secondary | ICD-10-CM | POA: Diagnosis not present

## 2011-12-15 DIAGNOSIS — IMO0001 Reserved for inherently not codable concepts without codable children: Secondary | ICD-10-CM | POA: Diagnosis not present

## 2011-12-15 DIAGNOSIS — M6281 Muscle weakness (generalized): Secondary | ICD-10-CM | POA: Diagnosis not present

## 2011-12-15 DIAGNOSIS — I2510 Atherosclerotic heart disease of native coronary artery without angina pectoris: Secondary | ICD-10-CM | POA: Diagnosis not present

## 2011-12-15 DIAGNOSIS — R269 Unspecified abnormalities of gait and mobility: Secondary | ICD-10-CM | POA: Diagnosis not present

## 2011-12-18 DIAGNOSIS — M545 Low back pain, unspecified: Secondary | ICD-10-CM | POA: Diagnosis not present

## 2011-12-18 DIAGNOSIS — Z4789 Encounter for other orthopedic aftercare: Secondary | ICD-10-CM | POA: Diagnosis not present

## 2011-12-18 DIAGNOSIS — I2510 Atherosclerotic heart disease of native coronary artery without angina pectoris: Secondary | ICD-10-CM | POA: Diagnosis not present

## 2011-12-18 DIAGNOSIS — R269 Unspecified abnormalities of gait and mobility: Secondary | ICD-10-CM | POA: Diagnosis not present

## 2011-12-18 DIAGNOSIS — M6281 Muscle weakness (generalized): Secondary | ICD-10-CM | POA: Diagnosis not present

## 2011-12-18 DIAGNOSIS — IMO0001 Reserved for inherently not codable concepts without codable children: Secondary | ICD-10-CM | POA: Diagnosis not present

## 2011-12-20 DIAGNOSIS — IMO0001 Reserved for inherently not codable concepts without codable children: Secondary | ICD-10-CM | POA: Diagnosis not present

## 2011-12-20 DIAGNOSIS — I2510 Atherosclerotic heart disease of native coronary artery without angina pectoris: Secondary | ICD-10-CM | POA: Diagnosis not present

## 2011-12-20 DIAGNOSIS — R269 Unspecified abnormalities of gait and mobility: Secondary | ICD-10-CM | POA: Diagnosis not present

## 2011-12-20 DIAGNOSIS — M6281 Muscle weakness (generalized): Secondary | ICD-10-CM | POA: Diagnosis not present

## 2011-12-20 DIAGNOSIS — M545 Low back pain, unspecified: Secondary | ICD-10-CM | POA: Diagnosis not present

## 2011-12-20 DIAGNOSIS — Z4789 Encounter for other orthopedic aftercare: Secondary | ICD-10-CM | POA: Diagnosis not present

## 2011-12-22 DIAGNOSIS — M6281 Muscle weakness (generalized): Secondary | ICD-10-CM | POA: Diagnosis not present

## 2011-12-22 DIAGNOSIS — M545 Low back pain, unspecified: Secondary | ICD-10-CM | POA: Diagnosis not present

## 2011-12-22 DIAGNOSIS — Z4789 Encounter for other orthopedic aftercare: Secondary | ICD-10-CM | POA: Diagnosis not present

## 2011-12-22 DIAGNOSIS — IMO0001 Reserved for inherently not codable concepts without codable children: Secondary | ICD-10-CM | POA: Diagnosis not present

## 2011-12-22 DIAGNOSIS — I2510 Atherosclerotic heart disease of native coronary artery without angina pectoris: Secondary | ICD-10-CM | POA: Diagnosis not present

## 2011-12-22 DIAGNOSIS — R269 Unspecified abnormalities of gait and mobility: Secondary | ICD-10-CM | POA: Diagnosis not present

## 2011-12-25 DIAGNOSIS — M545 Low back pain, unspecified: Secondary | ICD-10-CM | POA: Diagnosis not present

## 2011-12-25 DIAGNOSIS — I2510 Atherosclerotic heart disease of native coronary artery without angina pectoris: Secondary | ICD-10-CM | POA: Diagnosis not present

## 2011-12-25 DIAGNOSIS — Z4789 Encounter for other orthopedic aftercare: Secondary | ICD-10-CM | POA: Diagnosis not present

## 2011-12-25 DIAGNOSIS — M6281 Muscle weakness (generalized): Secondary | ICD-10-CM | POA: Diagnosis not present

## 2011-12-25 DIAGNOSIS — R269 Unspecified abnormalities of gait and mobility: Secondary | ICD-10-CM | POA: Diagnosis not present

## 2011-12-25 DIAGNOSIS — IMO0001 Reserved for inherently not codable concepts without codable children: Secondary | ICD-10-CM | POA: Diagnosis not present

## 2011-12-27 DIAGNOSIS — IMO0001 Reserved for inherently not codable concepts without codable children: Secondary | ICD-10-CM | POA: Diagnosis not present

## 2011-12-27 DIAGNOSIS — M545 Low back pain, unspecified: Secondary | ICD-10-CM | POA: Diagnosis not present

## 2011-12-27 DIAGNOSIS — Z4789 Encounter for other orthopedic aftercare: Secondary | ICD-10-CM | POA: Diagnosis not present

## 2011-12-27 DIAGNOSIS — I2510 Atherosclerotic heart disease of native coronary artery without angina pectoris: Secondary | ICD-10-CM | POA: Diagnosis not present

## 2011-12-27 DIAGNOSIS — R269 Unspecified abnormalities of gait and mobility: Secondary | ICD-10-CM | POA: Diagnosis not present

## 2011-12-27 DIAGNOSIS — M6281 Muscle weakness (generalized): Secondary | ICD-10-CM | POA: Diagnosis not present

## 2012-01-01 DIAGNOSIS — I2510 Atherosclerotic heart disease of native coronary artery without angina pectoris: Secondary | ICD-10-CM | POA: Diagnosis not present

## 2012-01-01 DIAGNOSIS — M545 Low back pain, unspecified: Secondary | ICD-10-CM | POA: Diagnosis not present

## 2012-01-01 DIAGNOSIS — IMO0001 Reserved for inherently not codable concepts without codable children: Secondary | ICD-10-CM | POA: Diagnosis not present

## 2012-01-01 DIAGNOSIS — R269 Unspecified abnormalities of gait and mobility: Secondary | ICD-10-CM | POA: Diagnosis not present

## 2012-01-01 DIAGNOSIS — M6281 Muscle weakness (generalized): Secondary | ICD-10-CM | POA: Diagnosis not present

## 2012-01-01 DIAGNOSIS — Z4789 Encounter for other orthopedic aftercare: Secondary | ICD-10-CM | POA: Diagnosis not present

## 2012-01-03 DIAGNOSIS — M545 Low back pain, unspecified: Secondary | ICD-10-CM | POA: Diagnosis not present

## 2012-01-03 DIAGNOSIS — IMO0001 Reserved for inherently not codable concepts without codable children: Secondary | ICD-10-CM | POA: Diagnosis not present

## 2012-01-03 DIAGNOSIS — R269 Unspecified abnormalities of gait and mobility: Secondary | ICD-10-CM | POA: Diagnosis not present

## 2012-01-03 DIAGNOSIS — M6281 Muscle weakness (generalized): Secondary | ICD-10-CM | POA: Diagnosis not present

## 2012-01-03 DIAGNOSIS — I2510 Atherosclerotic heart disease of native coronary artery without angina pectoris: Secondary | ICD-10-CM | POA: Diagnosis not present

## 2012-01-03 DIAGNOSIS — Z4789 Encounter for other orthopedic aftercare: Secondary | ICD-10-CM | POA: Diagnosis not present

## 2012-01-09 DIAGNOSIS — Z1212 Encounter for screening for malignant neoplasm of rectum: Secondary | ICD-10-CM | POA: Diagnosis not present

## 2012-01-11 DIAGNOSIS — H40009 Preglaucoma, unspecified, unspecified eye: Secondary | ICD-10-CM | POA: Diagnosis not present

## 2012-01-11 DIAGNOSIS — H2510 Age-related nuclear cataract, unspecified eye: Secondary | ICD-10-CM | POA: Diagnosis not present

## 2012-01-11 DIAGNOSIS — H538 Other visual disturbances: Secondary | ICD-10-CM | POA: Diagnosis not present

## 2012-01-16 DIAGNOSIS — M25519 Pain in unspecified shoulder: Secondary | ICD-10-CM | POA: Diagnosis not present

## 2012-01-29 ENCOUNTER — Ambulatory Visit (HOSPITAL_COMMUNITY)
Admission: RE | Admit: 2012-01-29 | Discharge: 2012-01-29 | Disposition: A | Discharge status: Home / Self Care | Payer: Medicare Other | Source: Ambulatory Visit | Attending: Family Medicine | Admitting: Family Medicine

## 2012-01-29 DIAGNOSIS — Z1231 Encounter for screening mammogram for malignant neoplasm of breast: Secondary | ICD-10-CM | POA: Diagnosis not present

## 2012-02-21 DIAGNOSIS — M899 Disorder of bone, unspecified: Secondary | ICD-10-CM | POA: Diagnosis not present

## 2012-02-23 DIAGNOSIS — I10 Essential (primary) hypertension: Secondary | ICD-10-CM | POA: Diagnosis not present

## 2012-02-23 DIAGNOSIS — R002 Palpitations: Secondary | ICD-10-CM | POA: Diagnosis not present

## 2012-02-23 DIAGNOSIS — E039 Hypothyroidism, unspecified: Secondary | ICD-10-CM | POA: Diagnosis not present

## 2012-02-23 DIAGNOSIS — E559 Vitamin D deficiency, unspecified: Secondary | ICD-10-CM | POA: Diagnosis not present

## 2012-02-23 DIAGNOSIS — E785 Hyperlipidemia, unspecified: Secondary | ICD-10-CM | POA: Diagnosis not present

## 2012-02-23 DIAGNOSIS — R5383 Other fatigue: Secondary | ICD-10-CM | POA: Diagnosis not present

## 2012-02-23 DIAGNOSIS — R5381 Other malaise: Secondary | ICD-10-CM | POA: Diagnosis not present

## 2012-02-27 DIAGNOSIS — R5383 Other fatigue: Secondary | ICD-10-CM | POA: Diagnosis not present

## 2012-02-27 DIAGNOSIS — Z124 Encounter for screening for malignant neoplasm of cervix: Secondary | ICD-10-CM | POA: Diagnosis not present

## 2012-02-27 DIAGNOSIS — R5381 Other malaise: Secondary | ICD-10-CM | POA: Diagnosis not present

## 2012-06-07 DIAGNOSIS — M5126 Other intervertebral disc displacement, lumbar region: Secondary | ICD-10-CM | POA: Diagnosis not present

## 2012-07-18 DIAGNOSIS — H538 Other visual disturbances: Secondary | ICD-10-CM | POA: Diagnosis not present

## 2012-07-18 DIAGNOSIS — H2510 Age-related nuclear cataract, unspecified eye: Secondary | ICD-10-CM | POA: Diagnosis not present

## 2012-07-18 DIAGNOSIS — H40009 Preglaucoma, unspecified, unspecified eye: Secondary | ICD-10-CM | POA: Diagnosis not present

## 2012-07-24 ENCOUNTER — Other Ambulatory Visit (HOSPITAL_COMMUNITY): Payer: Self-pay | Admitting: Nurse Practitioner

## 2012-08-01 ENCOUNTER — Other Ambulatory Visit: Payer: Self-pay | Admitting: Nurse Practitioner

## 2012-08-08 DIAGNOSIS — M5030 Other cervical disc degeneration, unspecified cervical region: Secondary | ICD-10-CM | POA: Diagnosis not present

## 2012-08-08 DIAGNOSIS — S43429A Sprain of unspecified rotator cuff capsule, initial encounter: Secondary | ICD-10-CM | POA: Diagnosis not present

## 2012-08-10 DIAGNOSIS — M25519 Pain in unspecified shoulder: Secondary | ICD-10-CM | POA: Diagnosis not present

## 2012-08-13 DIAGNOSIS — M542 Cervicalgia: Secondary | ICD-10-CM | POA: Diagnosis not present

## 2012-08-15 DIAGNOSIS — M19049 Primary osteoarthritis, unspecified hand: Secondary | ICD-10-CM | POA: Diagnosis not present

## 2012-08-15 DIAGNOSIS — S43429A Sprain of unspecified rotator cuff capsule, initial encounter: Secondary | ICD-10-CM | POA: Diagnosis not present

## 2012-09-02 DIAGNOSIS — H52209 Unspecified astigmatism, unspecified eye: Secondary | ICD-10-CM | POA: Diagnosis not present

## 2012-09-02 DIAGNOSIS — H2510 Age-related nuclear cataract, unspecified eye: Secondary | ICD-10-CM | POA: Diagnosis not present

## 2012-09-02 DIAGNOSIS — H5200 Hypermetropia, unspecified eye: Secondary | ICD-10-CM | POA: Diagnosis not present

## 2012-09-02 DIAGNOSIS — H47239 Glaucomatous optic atrophy, unspecified eye: Secondary | ICD-10-CM | POA: Diagnosis not present

## 2012-09-02 DIAGNOSIS — H269 Unspecified cataract: Secondary | ICD-10-CM | POA: Diagnosis not present

## 2012-09-02 DIAGNOSIS — H524 Presbyopia: Secondary | ICD-10-CM | POA: Diagnosis not present

## 2012-09-02 DIAGNOSIS — H538 Other visual disturbances: Secondary | ICD-10-CM | POA: Diagnosis not present

## 2012-09-02 DIAGNOSIS — Z7982 Long term (current) use of aspirin: Secondary | ICD-10-CM | POA: Diagnosis not present

## 2012-09-02 DIAGNOSIS — Z79899 Other long term (current) drug therapy: Secondary | ICD-10-CM | POA: Diagnosis not present

## 2012-09-02 DIAGNOSIS — H40009 Preglaucoma, unspecified, unspecified eye: Secondary | ICD-10-CM | POA: Diagnosis not present

## 2012-09-06 DIAGNOSIS — M545 Low back pain, unspecified: Secondary | ICD-10-CM | POA: Diagnosis not present

## 2012-09-06 DIAGNOSIS — M5030 Other cervical disc degeneration, unspecified cervical region: Secondary | ICD-10-CM | POA: Diagnosis not present

## 2012-09-17 ENCOUNTER — Ambulatory Visit: Payer: Medicare Other | Attending: Orthopedic Surgery | Admitting: Physical Therapy

## 2012-09-17 DIAGNOSIS — M542 Cervicalgia: Secondary | ICD-10-CM | POA: Diagnosis not present

## 2012-09-17 DIAGNOSIS — IMO0001 Reserved for inherently not codable concepts without codable children: Secondary | ICD-10-CM | POA: Diagnosis not present

## 2012-09-17 DIAGNOSIS — R5381 Other malaise: Secondary | ICD-10-CM | POA: Diagnosis not present

## 2012-09-17 DIAGNOSIS — M25519 Pain in unspecified shoulder: Secondary | ICD-10-CM | POA: Insufficient documentation

## 2012-09-18 ENCOUNTER — Ambulatory Visit: Payer: Medicare Other | Admitting: *Deleted

## 2012-09-18 DIAGNOSIS — M25519 Pain in unspecified shoulder: Secondary | ICD-10-CM | POA: Diagnosis not present

## 2012-09-18 DIAGNOSIS — M542 Cervicalgia: Secondary | ICD-10-CM | POA: Diagnosis not present

## 2012-09-18 DIAGNOSIS — IMO0001 Reserved for inherently not codable concepts without codable children: Secondary | ICD-10-CM | POA: Diagnosis not present

## 2012-09-18 DIAGNOSIS — R5381 Other malaise: Secondary | ICD-10-CM | POA: Diagnosis not present

## 2012-09-19 ENCOUNTER — Other Ambulatory Visit (HOSPITAL_COMMUNITY): Payer: Self-pay | Admitting: Family Medicine

## 2012-09-23 ENCOUNTER — Ambulatory Visit: Payer: Medicare Other | Admitting: *Deleted

## 2012-09-23 DIAGNOSIS — R5381 Other malaise: Secondary | ICD-10-CM | POA: Diagnosis not present

## 2012-09-23 DIAGNOSIS — M25519 Pain in unspecified shoulder: Secondary | ICD-10-CM | POA: Diagnosis not present

## 2012-09-23 DIAGNOSIS — M542 Cervicalgia: Secondary | ICD-10-CM | POA: Diagnosis not present

## 2012-09-23 DIAGNOSIS — IMO0001 Reserved for inherently not codable concepts without codable children: Secondary | ICD-10-CM | POA: Diagnosis not present

## 2012-09-25 ENCOUNTER — Ambulatory Visit: Payer: Medicare Other | Admitting: *Deleted

## 2012-09-25 DIAGNOSIS — M25519 Pain in unspecified shoulder: Secondary | ICD-10-CM | POA: Diagnosis not present

## 2012-09-25 DIAGNOSIS — M542 Cervicalgia: Secondary | ICD-10-CM | POA: Diagnosis not present

## 2012-09-25 DIAGNOSIS — R5381 Other malaise: Secondary | ICD-10-CM | POA: Diagnosis not present

## 2012-09-25 DIAGNOSIS — IMO0001 Reserved for inherently not codable concepts without codable children: Secondary | ICD-10-CM | POA: Diagnosis not present

## 2012-10-01 ENCOUNTER — Ambulatory Visit: Payer: Medicare Other | Admitting: Physical Therapy

## 2012-10-01 DIAGNOSIS — M25519 Pain in unspecified shoulder: Secondary | ICD-10-CM | POA: Diagnosis not present

## 2012-10-01 DIAGNOSIS — M542 Cervicalgia: Secondary | ICD-10-CM | POA: Diagnosis not present

## 2012-10-01 DIAGNOSIS — IMO0001 Reserved for inherently not codable concepts without codable children: Secondary | ICD-10-CM | POA: Diagnosis not present

## 2012-10-01 DIAGNOSIS — R5381 Other malaise: Secondary | ICD-10-CM | POA: Diagnosis not present

## 2012-10-02 ENCOUNTER — Other Ambulatory Visit: Payer: Self-pay | Admitting: Nurse Practitioner

## 2012-10-03 ENCOUNTER — Ambulatory Visit: Payer: Medicare Other | Admitting: Physical Therapy

## 2012-10-03 DIAGNOSIS — M542 Cervicalgia: Secondary | ICD-10-CM | POA: Diagnosis not present

## 2012-10-03 DIAGNOSIS — M25519 Pain in unspecified shoulder: Secondary | ICD-10-CM | POA: Diagnosis not present

## 2012-10-03 DIAGNOSIS — IMO0001 Reserved for inherently not codable concepts without codable children: Secondary | ICD-10-CM | POA: Diagnosis not present

## 2012-10-03 DIAGNOSIS — R5381 Other malaise: Secondary | ICD-10-CM | POA: Diagnosis not present

## 2012-10-08 ENCOUNTER — Ambulatory Visit: Payer: Medicare Other | Admitting: Physical Therapy

## 2012-10-08 DIAGNOSIS — R5381 Other malaise: Secondary | ICD-10-CM | POA: Diagnosis not present

## 2012-10-08 DIAGNOSIS — M25519 Pain in unspecified shoulder: Secondary | ICD-10-CM | POA: Diagnosis not present

## 2012-10-08 DIAGNOSIS — IMO0001 Reserved for inherently not codable concepts without codable children: Secondary | ICD-10-CM | POA: Diagnosis not present

## 2012-10-08 DIAGNOSIS — M542 Cervicalgia: Secondary | ICD-10-CM | POA: Diagnosis not present

## 2012-10-10 ENCOUNTER — Ambulatory Visit: Payer: Medicare Other | Admitting: Physical Therapy

## 2012-10-10 DIAGNOSIS — R5381 Other malaise: Secondary | ICD-10-CM | POA: Diagnosis not present

## 2012-10-10 DIAGNOSIS — M542 Cervicalgia: Secondary | ICD-10-CM | POA: Diagnosis not present

## 2012-10-10 DIAGNOSIS — M25519 Pain in unspecified shoulder: Secondary | ICD-10-CM | POA: Diagnosis not present

## 2012-10-10 DIAGNOSIS — IMO0001 Reserved for inherently not codable concepts without codable children: Secondary | ICD-10-CM | POA: Diagnosis not present

## 2012-10-15 ENCOUNTER — Ambulatory Visit: Payer: Medicare Other | Attending: Orthopedic Surgery | Admitting: Physical Therapy

## 2012-10-15 DIAGNOSIS — M25519 Pain in unspecified shoulder: Secondary | ICD-10-CM | POA: Diagnosis not present

## 2012-10-15 DIAGNOSIS — IMO0001 Reserved for inherently not codable concepts without codable children: Secondary | ICD-10-CM | POA: Diagnosis not present

## 2012-10-15 DIAGNOSIS — M542 Cervicalgia: Secondary | ICD-10-CM | POA: Insufficient documentation

## 2012-10-15 DIAGNOSIS — R5381 Other malaise: Secondary | ICD-10-CM | POA: Diagnosis not present

## 2012-10-17 ENCOUNTER — Ambulatory Visit: Payer: Medicare Other | Admitting: Physical Therapy

## 2012-10-22 ENCOUNTER — Ambulatory Visit: Payer: Medicare Other | Admitting: Physical Therapy

## 2012-10-24 ENCOUNTER — Ambulatory Visit: Payer: Medicare Other | Admitting: Physical Therapy

## 2012-10-28 ENCOUNTER — Other Ambulatory Visit (HOSPITAL_COMMUNITY): Payer: Self-pay | Admitting: Nurse Practitioner

## 2012-10-29 ENCOUNTER — Ambulatory Visit: Payer: Medicare Other | Admitting: *Deleted

## 2012-10-30 ENCOUNTER — Encounter: Payer: Self-pay | Admitting: *Deleted

## 2012-10-30 NOTE — Telephone Encounter (Signed)
LAST OV 02/27/12. WAS TOLD NEEDED TO BE SEEN LAST REFILL.

## 2012-10-31 ENCOUNTER — Ambulatory Visit: Payer: Medicare Other | Admitting: *Deleted

## 2012-11-04 ENCOUNTER — Ambulatory Visit: Payer: Self-pay | Admitting: Nurse Practitioner

## 2012-11-07 ENCOUNTER — Ambulatory Visit: Payer: Medicare Other | Admitting: *Deleted

## 2012-11-08 ENCOUNTER — Ambulatory Visit (INDEPENDENT_AMBULATORY_CARE_PROVIDER_SITE_OTHER): Payer: Medicare Other | Admitting: Nurse Practitioner

## 2012-11-08 ENCOUNTER — Encounter: Payer: Self-pay | Admitting: Nurse Practitioner

## 2012-11-08 VITALS — BP 141/79 | HR 75 | Temp 97.1°F | Ht 62.0 in | Wt 167.0 lb

## 2012-11-08 DIAGNOSIS — E039 Hypothyroidism, unspecified: Secondary | ICD-10-CM | POA: Diagnosis not present

## 2012-11-08 DIAGNOSIS — M899 Disorder of bone, unspecified: Secondary | ICD-10-CM

## 2012-11-08 DIAGNOSIS — M8580 Other specified disorders of bone density and structure, unspecified site: Secondary | ICD-10-CM

## 2012-11-08 DIAGNOSIS — E785 Hyperlipidemia, unspecified: Secondary | ICD-10-CM

## 2012-11-08 MED ORDER — ROSUVASTATIN CALCIUM 10 MG PO TABS: 10.0000 mg | ORAL_TABLET | Freq: Every day | ORAL | Status: DC

## 2012-11-08 MED ORDER — LEVOTHYROXINE SODIUM 100 MCG PO TABS: 100.0000 ug | ORAL_TABLET | Freq: Every day | ORAL | Status: DC

## 2012-11-08 MED ORDER — RALOXIFENE HCL 60 MG PO TABS: 60.0000 mg | ORAL_TABLET | Freq: Every day | ORAL | Status: DC

## 2012-11-08 NOTE — Progress Notes (Signed)
  Subjective:    Patient ID: Nicole Wheeler, female    DOB: 20-May-1930, 79 y.o.   MRN: [id number]  Hyperlipidemia This is a chronic problem. The current episode started more than 1 year ago. The problem is controlled. Exacerbating diseases include hypothyroidism and obesity. She has no history of diabetes. There are no known factors aggravating her hyperlipidemia. Pertinent negatives include no focal sensory loss, focal weakness, leg pain, myalgias or shortness of breath. Current antihyperlipidemic treatment includes statins. The current treatment provides moderate improvement of lipids. Compliance problems include adherence to diet and adherence to exercise.   Thyroid Problem Presents for follow-up (hypothyroidism) visit. Patient reports no anxiety, constipation, depressed mood, diarrhea, heat intolerance, hoarse voice, leg swelling, nail problem, palpitations, tremors or weight loss. Her past medical history is significant for hyperlipidemia. There is no history of diabetes.   * Right torn rotator cuff right shoulder- is planning to schedule surgery in the future when she completes PT for her neck   Review of Systems  Constitutional: Negative for weight loss.  HENT: Negative for hoarse voice.   Respiratory: Negative for shortness of breath.   Cardiovascular: Negative for palpitations.  Gastrointestinal: Negative for diarrhea and constipation.  Endocrine: Negative for heat intolerance.  Musculoskeletal: Negative for myalgias.  Neurological: Negative for tremors and focal weakness.       Objective:   Physical Exam  Constitutional: She is oriented to person, place, and time. She appears well-developed and well-nourished.  HENT:  Nose: Nose normal.  Mouth/Throat: Oropharynx is clear and moist.  Eyes: EOM are normal.  Neck: Trachea normal, normal range of motion and full passive range of motion without pain. Neck supple. No JVD present. Carotid bruit is not present. No thyromegaly present.   Cardiovascular: Normal rate, regular rhythm, normal heart sounds and intact distal pulses.  Exam reveals no gallop and no friction rub.   No murmur heard. Pulmonary/Chest: Effort normal and breath sounds normal.  Abdominal: Soft. Bowel sounds are normal. She exhibits no distension and no mass. There is no tenderness.  Musculoskeletal: Normal range of motion.  Lymphadenopathy:    She has no cervical adenopathy.  Neurological: She is alert and oriented to person, place, and time. She has normal reflexes.  Skin: Skin is warm and dry.  Psychiatric: She has a normal mood and affect. Her behavior is normal. Judgment and thought content normal.   BP 141/79  Pulse 75  Temp(Src) 97.1 F (36.2 C) (Oral)  Ht 5\' 2"  (1.575 m)  Wt 167 lb (75.751 kg)  BMI 30.54 kg/m2        Assessment & Plan:  1. Hypothyroidism - levothyroxine (SYNTHROID, LEVOTHROID) 100 MCG tablet; Take 1 tablet (100 mcg total) by mouth daily before breakfast.  Dispense: 90 tablet; Refill: 1 - Thyroid Panel With TSH  2. Hyperlipidemia Low at diet and exercise - rosuvastatin (CRESTOR) 10 MG tablet; Take 1 tablet (10 mg total) by mouth daily.  Dispense: 90 tablet; Refill: 1 - CMP14+EGFR - NMR, lipoprofile  3. Osteopenia Weight bearing exercises - raloxifene (EVISTA) 60 MG tablet; Take 1 tablet (60 mg total) by mouth daily.  Dispense: 90 tablet; Refill: 1  Mary-Margaret Daphine Deutscher, FNP

## 2012-11-10 LAB — CMP14+EGFR
AST: 17 IU/L (ref 0–40)
Alkaline Phosphatase: 83 IU/L (ref 39–117)
BUN/Creatinine Ratio: 22 (ref 11–26)
CO2: 28 mmol/L (ref 18–29)
Chloride: 103 mmol/L (ref 97–108)
GFR calc Af Amer: 61 mL/min/{1.73_m2} (ref >59)
Potassium: 4.8 mmol/L (ref 3.5–5.2)
Sodium: 144 mmol/L (ref 134–144)
Total Bilirubin: 0.2 mg/dL (ref 0.0–1.2)

## 2012-11-10 LAB — NMR, LIPOPROFILE
Cholesterol: 150 mg/dL (ref <200)
LDL Particle Number: 998 nmol/L (ref <1000)
LDL Size: 21.2 nm (ref >20.5)
LDLC SERPL CALC-MCNC: 69 mg/dL (ref <100)
LP-IR Score: 38 (ref <=45)

## 2012-11-10 LAB — THYROID PANEL WITH TSH
Free Thyroxine Index: 2.3 (ref 1.2–4.9)
T3 Uptake Ratio: 30 % (ref 24–39)
TSH: 1.3 u[IU]/mL (ref 0.450–4.500)

## 2012-11-12 ENCOUNTER — Ambulatory Visit: Payer: Medicare Other | Attending: Orthopedic Surgery | Admitting: *Deleted

## 2012-11-12 DIAGNOSIS — M25519 Pain in unspecified shoulder: Secondary | ICD-10-CM | POA: Insufficient documentation

## 2012-11-12 DIAGNOSIS — IMO0001 Reserved for inherently not codable concepts without codable children: Secondary | ICD-10-CM | POA: Diagnosis not present

## 2012-11-12 DIAGNOSIS — R5381 Other malaise: Secondary | ICD-10-CM | POA: Insufficient documentation

## 2012-11-12 DIAGNOSIS — M542 Cervicalgia: Secondary | ICD-10-CM | POA: Insufficient documentation

## 2012-11-13 ENCOUNTER — Ambulatory Visit: Payer: Self-pay | Admitting: Nurse Practitioner

## 2012-12-14 DIAGNOSIS — Z23 Encounter for immunization: Secondary | ICD-10-CM | POA: Diagnosis not present

## 2013-01-21 ENCOUNTER — Other Ambulatory Visit: Payer: Self-pay | Admitting: Family Medicine

## 2013-01-21 DIAGNOSIS — Z1231 Encounter for screening mammogram for malignant neoplasm of breast: Secondary | ICD-10-CM

## 2013-01-29 DIAGNOSIS — M6530 Trigger finger, unspecified finger: Secondary | ICD-10-CM | POA: Diagnosis not present

## 2013-01-29 DIAGNOSIS — M79609 Pain in unspecified limb: Secondary | ICD-10-CM | POA: Diagnosis not present

## 2013-02-12 ENCOUNTER — Ambulatory Visit (HOSPITAL_COMMUNITY)
Admission: RE | Admit: 2013-02-12 | Discharge: 2013-02-12 | Disposition: A | Discharge status: Home / Self Care | Payer: Medicare Other | Source: Ambulatory Visit | Attending: Family Medicine | Admitting: Family Medicine

## 2013-02-12 DIAGNOSIS — Z1231 Encounter for screening mammogram for malignant neoplasm of breast: Secondary | ICD-10-CM | POA: Diagnosis not present

## 2013-02-26 ENCOUNTER — Other Ambulatory Visit (INDEPENDENT_AMBULATORY_CARE_PROVIDER_SITE_OTHER): Payer: Medicare Other

## 2013-02-26 DIAGNOSIS — E785 Hyperlipidemia, unspecified: Secondary | ICD-10-CM | POA: Diagnosis not present

## 2013-02-26 DIAGNOSIS — I10 Essential (primary) hypertension: Secondary | ICD-10-CM | POA: Diagnosis not present

## 2013-02-26 NOTE — Progress Notes (Signed)
Patient came in for labs only.

## 2013-02-28 LAB — CMP14+EGFR
ALT: 12 IU/L (ref 0–32)
AST: 17 IU/L (ref 0–40)
Albumin/Globulin Ratio: 2 (ref 1.1–2.5)
Alkaline Phosphatase: 74 IU/L (ref 39–117)
BUN/Creatinine Ratio: 27 — ABNORMAL HIGH (ref 11–26)
CO2: 25 mmol/L (ref 18–29)
Calcium: 9.5 mg/dL (ref 8.6–10.2)
Creatinine, Ser: 0.88 mg/dL (ref 0.57–1.00)
GFR calc non Af Amer: 62 mL/min/{1.73_m2} (ref >59)
Globulin, Total: 2.1 g/dL (ref 1.5–4.5)
Potassium: 4.6 mmol/L (ref 3.5–5.2)
Sodium: 144 mmol/L (ref 134–144)

## 2013-02-28 LAB — NMR, LIPOPROFILE
HDL Particle Number: 30.2 umol/L — ABNORMAL LOW (ref >=30.5)
LP-IR Score: 37 (ref <=45)
Small LDL Particle Number: 537 nmol/L — ABNORMAL HIGH (ref <=527)

## 2013-02-28 LAB — SPECIMEN STATUS REPORT

## 2013-03-03 ENCOUNTER — Encounter: Payer: Self-pay | Admitting: Nurse Practitioner

## 2013-03-03 ENCOUNTER — Ambulatory Visit (INDEPENDENT_AMBULATORY_CARE_PROVIDER_SITE_OTHER): Payer: Medicare Other | Admitting: Nurse Practitioner

## 2013-03-03 VITALS — BP 165/75 | HR 71 | Temp 97.9°F | Ht 62.0 in | Wt 164.0 lb

## 2013-03-03 DIAGNOSIS — E785 Hyperlipidemia, unspecified: Secondary | ICD-10-CM | POA: Diagnosis not present

## 2013-03-03 DIAGNOSIS — E039 Hypothyroidism, unspecified: Secondary | ICD-10-CM | POA: Diagnosis not present

## 2013-03-03 DIAGNOSIS — M899 Disorder of bone, unspecified: Secondary | ICD-10-CM

## 2013-03-03 DIAGNOSIS — M8580 Other specified disorders of bone density and structure, unspecified site: Secondary | ICD-10-CM

## 2013-03-03 MED ORDER — RALOXIFENE HCL 60 MG PO TABS: 60.0000 mg | ORAL_TABLET | Freq: Every day | ORAL | Status: DC

## 2013-03-03 MED ORDER — LEVOTHYROXINE SODIUM 100 MCG PO TABS: 100.0000 ug | ORAL_TABLET | Freq: Every day | ORAL | Status: DC

## 2013-03-03 MED ORDER — HYDROCHLOROTHIAZIDE 25 MG PO TABS: 25.0000 mg | ORAL_TABLET | Freq: Every day | ORAL | Status: DC

## 2013-03-03 NOTE — Patient Instructions (Signed)

## 2013-03-03 NOTE — Progress Notes (Signed)
Subjective:    Patient ID: Nicole Wheeler, female    DOB: Dec 15, 1930, 79 y.o.   MRN: [id number]  Hyperlipidemia This is a chronic problem. The current episode started more than 1 year ago. The problem is controlled. Exacerbating diseases include hypothyroidism and obesity. She has no history of diabetes. There are no known factors aggravating her hyperlipidemia. Pertinent negatives include no focal sensory loss, focal weakness, leg pain, myalgias or shortness of breath. Current antihyperlipidemic treatment includes statins. The current treatment provides moderate improvement of lipids. Compliance problems include adherence to diet and adherence to exercise.   Thyroid Problem Presents for follow-up (hypothyroidism) visit. Patient reports no anxiety, constipation, depressed mood, diarrhea, heat intolerance, hoarse voice, leg swelling, nail problem, palpitations, tremors or weight loss. Her past medical history is significant for hyperlipidemia. There is no history of diabetes.   *Patient has been checking blood pressure at home and it has been running high about 60% of the time that she checks it.  Review of Systems  Constitutional: Negative for weight loss.  HENT: Negative for hoarse voice.   Respiratory: Negative for shortness of breath.   Cardiovascular: Negative for palpitations.  Gastrointestinal: Negative for diarrhea and constipation.  Endocrine: Negative for heat intolerance.  Musculoskeletal: Negative for myalgias.  Neurological: Negative for tremors and focal weakness.       Objective:   Physical Exam  Constitutional: She is oriented to person, place, and time. She appears well-developed and well-nourished.  HENT:  Nose: Nose normal.  Mouth/Throat: Oropharynx is clear and moist.  Eyes: EOM are normal.  Neck: Trachea normal, normal range of motion and full passive range of motion without pain. Neck supple. No JVD present. Carotid bruit is not present. No thyromegaly present.   Cardiovascular: Normal rate, regular rhythm, normal heart sounds and intact distal pulses.  Exam reveals no gallop and no friction rub.   No murmur heard. Pulmonary/Chest: Effort normal and breath sounds normal.  Abdominal: Soft. Bowel sounds are normal. She exhibits no distension and no mass. There is no tenderness.  Musculoskeletal: Normal range of motion.  Lymphadenopathy:    She has no cervical adenopathy.  Neurological: She is alert and oriented to person, place, and time. She has normal reflexes.  Skin: Skin is warm and dry.  Psychiatric: She has a normal mood and affect. Her behavior is normal. Judgment and thought content normal.   BP 165/75  Pulse 71  Temp(Src) 97.9 F (36.6 C) (Oral)  Ht 5\' 2"  (1.575 m)  Wt 164 lb (74.39 kg)  BMI 29.99 kg/m2        Assessment & Plan:   1. Hyperlipidemia   2. Hypothyroidism   3. Osteopenia    No orders of the defined types were placed in this encounter.   Meds ordered this encounter  Medications  . hydrochlorothiazide (HYDRODIURIL) 25 MG tablet    Sig: Take 1 tablet (25 mg total) by mouth daily.    Dispense:  30 tablet    Refill:  5    Order Specific Question:  Supervising Provider    Answer:  Ernestina Penna [[id number]]  . levothyroxine (SYNTHROID, LEVOTHROID) 100 MCG tablet    Sig: Take 1 tablet (100 mcg total) by mouth daily before breakfast.    Dispense:  90 tablet    Refill:  1    Order Specific Question:  Supervising Provider    Answer:  Ernestina Penna [[id number]]  . raloxifene (EVISTA) 60 MG tablet    Sig: Take 1  tablet (60 mg total) by mouth daily.    Dispense:  90 tablet    Refill:  1    Order Specific Question:  Supervising Provider    Answer:  Ernestina Penna [[id number]]   Hemoccult cards given Continue all meds Labs pending Diet and exercise encouraged Health maintenance reviewed Follow up in 3 months  Mary-Margaret Daphine Deutscher, FNP

## 2013-03-10 ENCOUNTER — Telehealth: Payer: Self-pay | Admitting: Family Medicine

## 2013-03-10 NOTE — Telephone Encounter (Signed)
Message copied by Azalee Course on Mon Mar 10, 2013  2:08 PM ------      Message from: Bennie Pierini      Created: Fri Feb 28, 2013  1:38 PM       All labs look good- continue current meds and recheck in 3 months ------

## 2013-03-12 DIAGNOSIS — M6530 Trigger finger, unspecified finger: Secondary | ICD-10-CM | POA: Diagnosis not present

## 2013-03-19 ENCOUNTER — Telehealth: Payer: Self-pay | Admitting: Nurse Practitioner

## 2013-03-19 ENCOUNTER — Other Ambulatory Visit: Payer: Medicare Other

## 2013-03-19 DIAGNOSIS — Z1212 Encounter for screening for malignant neoplasm of rectum: Secondary | ICD-10-CM | POA: Diagnosis not present

## 2013-03-19 NOTE — Progress Notes (Signed)
Patient dropped off fobt 

## 2013-03-19 NOTE — Telephone Encounter (Signed)
Explained to patient to bring back in

## 2013-03-20 LAB — FECAL OCCULT BLOOD, IMMUNOCHEMICAL: Fecal Occult Bld: NEGATIVE

## 2013-03-24 ENCOUNTER — Encounter: Payer: Self-pay | Admitting: *Deleted

## 2013-03-24 NOTE — Progress Notes (Signed)
Quick Note:  Copy of labs sent to patient ______ 

## 2013-06-02 ENCOUNTER — Encounter: Payer: Self-pay | Admitting: Nurse Practitioner

## 2013-06-02 ENCOUNTER — Ambulatory Visit (INDEPENDENT_AMBULATORY_CARE_PROVIDER_SITE_OTHER): Payer: Medicare Other | Admitting: Nurse Practitioner

## 2013-06-02 VITALS — BP 138/83 | HR 73 | Temp 97.8°F | Ht 62.0 in | Wt 163.0 lb

## 2013-06-02 DIAGNOSIS — M899 Disorder of bone, unspecified: Secondary | ICD-10-CM | POA: Diagnosis not present

## 2013-06-02 DIAGNOSIS — E039 Hypothyroidism, unspecified: Secondary | ICD-10-CM

## 2013-06-02 DIAGNOSIS — M949 Disorder of cartilage, unspecified: Secondary | ICD-10-CM

## 2013-06-02 DIAGNOSIS — I10 Essential (primary) hypertension: Secondary | ICD-10-CM | POA: Diagnosis not present

## 2013-06-02 DIAGNOSIS — E785 Hyperlipidemia, unspecified: Secondary | ICD-10-CM

## 2013-06-02 DIAGNOSIS — J019 Acute sinusitis, unspecified: Secondary | ICD-10-CM

## 2013-06-02 DIAGNOSIS — M8580 Other specified disorders of bone density and structure, unspecified site: Secondary | ICD-10-CM

## 2013-06-02 MED ORDER — AZITHROMYCIN 250 MG PO TABS: ORAL_TABLET | ORAL | Status: DC

## 2013-06-02 NOTE — Progress Notes (Signed)
Subjective:    Patient ID: ILYA ESS, female    DOB: December 27, 1930, 80 y.o.   MRN: [id number]  Patient here today for follow up of chronic medical problems- She is doing well overall- Her only complaint today is cough and congestion- Started over a week ago- denies any fever- see ROS.  Hyperlipidemia This is a chronic problem. The current episode started more than 1 year ago. The problem is controlled. Exacerbating diseases include hypothyroidism and obesity. She has no history of diabetes. There are no known factors aggravating her hyperlipidemia. Pertinent negatives include no chest pain, focal sensory loss, focal weakness, leg pain or shortness of breath. Current antihyperlipidemic treatment includes statins. The current treatment provides moderate improvement of lipids. Compliance problems include adherence to diet and adherence to exercise.   Thyroid Problem Presents for follow-up (hypothyroidism) visit. Patient reports no anxiety, constipation, depressed mood, diarrhea, heat intolerance, hoarse voice, leg swelling, nail problem, palpitations or tremors. Her past medical history is significant for hyperlipidemia. There is no history of diabetes.  Hypertension This is a new problem. Episode onset: less than 1 year. The problem has been resolved since onset. The problem is controlled. Associated symptoms include headaches. Pertinent negatives include no blurred vision, chest pain, malaise/fatigue, palpitations, peripheral edema or shortness of breath. There are no associated agents to hypertension. Risk factors for coronary artery disease include dyslipidemia and post-menopausal state. Past treatments include diuretics. The current treatment provides significant improvement. There are no compliance problems.  Hypertensive end-organ damage includes a thyroid problem.     Review of Systems  Constitutional: Negative for fever, chills, malaise/fatigue and appetite change.  HENT: Positive for  congestion, postnasal drip, rhinorrhea and sinus pressure. Negative for hoarse voice, sore throat and trouble swallowing.   Eyes: Negative for blurred vision.  Respiratory: Positive for cough (nonproductive). Negative for shortness of breath.   Cardiovascular: Negative for chest pain and palpitations.  Gastrointestinal: Negative for diarrhea and constipation.  Endocrine: Negative for heat intolerance.  Neurological: Positive for dizziness and headaches. Negative for tremors and focal weakness.  All other systems reviewed and are negative.       Objective:   Physical Exam  Constitutional: She is oriented to person, place, and time. She appears well-developed and well-nourished.  HENT:  Right Ear: Hearing, tympanic membrane, external ear and ear canal normal.  Left Ear: Hearing, tympanic membrane, external ear and ear canal normal.  Nose: Mucosal edema and rhinorrhea present. Right sinus exhibits maxillary sinus tenderness. Right sinus exhibits no frontal sinus tenderness. Left sinus exhibits maxillary sinus tenderness. Left sinus exhibits no frontal sinus tenderness.  Mouth/Throat: Uvula is midline, oropharynx is clear and moist and mucous membranes are normal.  Eyes: EOM are normal.  Neck: Trachea normal, normal range of motion and full passive range of motion without pain. Neck supple. No JVD present. Carotid bruit is not present. No thyromegaly present.  Cardiovascular: Normal rate, regular rhythm, normal heart sounds and intact distal pulses.  Exam reveals no gallop and no friction rub.   No murmur heard. Pulmonary/Chest: Effort normal and breath sounds normal.  Abdominal: Soft. Bowel sounds are normal. She exhibits no distension and no mass. There is no tenderness.  Musculoskeletal: Normal range of motion.  Lymphadenopathy:    She has no cervical adenopathy.  Neurological: She is alert and oriented to person, place, and time. She has normal reflexes.  Skin: Skin is warm and dry.   Psychiatric: She has a normal mood and affect. Her behavior is normal.  Judgment and thought content normal.   BP 138/83  Pulse 73  Temp(Src) 97.8 F (36.6 C) (Oral)  Ht _0  (1.575 m)  Wt 163 lb (73.936 kg)  BMI 29.81 kg/m2        Assessment & Plan:    1. Hypothyroidism   2. Hyperlipidemia   3. Osteopenia   4. Hypertension   5. Acute sinusitis    Orders Placed This Encounter  Procedures  . DG Bone Density    Standing Status: Future     Number of Occurrences:      Standing Expiration Date: 08/02/2014    Order Specific Question:  Reason for Exam (SYMPTOM  OR DIAGNOSIS REQUIRED)    Answer:  screening    Order Specific Question:  Preferred imaging location?    Answer:  Internal  . CMP14+EGFR  . NMR, lipoprofile   Meds ordered this encounter  Medications  . azithromycin (ZITHROMAX Z-PAK) 250 MG tablet    Sig: As directed    Dispense:  6 each    Refill:  0    Order Specific Question:  Supervising Provider    Answer:  Chipper Herb [[id number]]   1. Take meds as prescribed 2. Use a cool mist humidifier especially during the winter months and when heat has been humid. 3. Use saline nose sprays frequently 4. Saline irrigations of the nose can be very helpful if done frequently.  * 4X daily for 1 week*  * Use of a nettie pot can be helpful with this. Follow directions with this* 5. Drink plenty of fluids 6. Keep thermostat turn down low 7.For any cough or congestion  Use plain Mucinex- regular strength or max strength is fine   * Children- consult with Pharmacist for dosing 8. For fever or aces or pains- take tylenol or ibuprofen appropriate for age and weight.  * for fevers greater than 101 orally you may alternate ibuprofen and tylenol every  3 hours.   Labs pending Health maintenance reviewed Diet and exercise encouraged Continue all meds Follow up  In 3 months   Nicole Wheeler

## 2013-06-02 NOTE — Patient Instructions (Signed)

## 2013-06-03 LAB — CMP14+EGFR
A/G RATIO: 2.6 — AB (ref 1.1–2.5)
ALBUMIN: 4.6 g/dL (ref 3.5–4.7)
ALT: 18 IU/L (ref 0–32)
AST: 22 IU/L (ref 0–40)
Alkaline Phosphatase: 82 IU/L (ref 39–117)
BILIRUBIN TOTAL: 0.2 mg/dL (ref 0.0–1.2)
BUN/Creatinine Ratio: 20 (ref 11–26)
BUN: 19 mg/dL (ref 8–27)
CO2: 26 mmol/L (ref 18–29)
CREATININE: 0.97 mg/dL (ref 0.57–1.00)
Calcium: 10 mg/dL (ref 8.7–10.3)
Chloride: 99 mmol/L (ref 97–108)
GFR, EST AFRICAN AMERICAN: 64 mL/min/{1.73_m2} (ref >59)
GFR, EST NON AFRICAN AMERICAN: 55 mL/min/{1.73_m2} — AB (ref >59)
GLOBULIN, TOTAL: 1.8 g/dL (ref 1.5–4.5)
GLUCOSE: 96 mg/dL (ref 65–99)
Potassium: 4.2 mmol/L (ref 3.5–5.2)
Sodium: 142 mmol/L (ref 134–144)
TOTAL PROTEIN: 6.4 g/dL (ref 6.0–8.5)

## 2013-06-03 LAB — NMR, LIPOPROFILE
CHOLESTEROL: 155 mg/dL (ref <200)
HDL CHOLESTEROL BY NMR: 50 mg/dL (ref >=40)
HDL Particle Number: 33.3 umol/L (ref >=30.5)
LDL Particle Number: 1283 nmol/L — ABNORMAL HIGH (ref <1000)
LDL SIZE: 20.9 nm (ref >20.5)
LDLC SERPL CALC-MCNC: 78 mg/dL (ref <100)
LP-IR Score: 31 (ref <=45)
SMALL LDL PARTICLE NUMBER: 470 nmol/L (ref <=527)
TRIGLYCERIDES BY NMR: 134 mg/dL (ref <150)

## 2013-06-04 ENCOUNTER — Telehealth: Payer: Self-pay | Admitting: Nurse Practitioner

## 2013-06-05 NOTE — Telephone Encounter (Signed)
Patient was called yesterday. 

## 2013-07-16 ENCOUNTER — Ambulatory Visit (INDEPENDENT_AMBULATORY_CARE_PROVIDER_SITE_OTHER): Payer: Medicare Other

## 2013-07-16 ENCOUNTER — Ambulatory Visit (INDEPENDENT_AMBULATORY_CARE_PROVIDER_SITE_OTHER): Payer: Medicare Other | Admitting: Pharmacist

## 2013-07-16 ENCOUNTER — Encounter: Payer: Self-pay | Admitting: Pharmacist

## 2013-07-16 VITALS — Ht 62.5 in | Wt 165.2 lb

## 2013-07-16 DIAGNOSIS — M8580 Other specified disorders of bone density and structure, unspecified site: Secondary | ICD-10-CM

## 2013-07-16 DIAGNOSIS — M949 Disorder of cartilage, unspecified: Secondary | ICD-10-CM

## 2013-07-16 DIAGNOSIS — M899 Disorder of bone, unspecified: Secondary | ICD-10-CM | POA: Diagnosis not present

## 2013-07-16 LAB — HM DEXA SCAN

## 2013-07-16 NOTE — Patient Instructions (Signed)

## 2013-07-16 NOTE — Progress Notes (Signed)
Patient ID: Nicole Wheeler, female   DOB: 1930/06/02, 80 y.o.   MRN: [id number] [facility] Current Height: Height: 5' 2.5" (158.8 cm)      Max Lifetime Height: 5\' 3"  Current Weight: Weight: 165 lb 4 oz (74.957 kg)       Ethnicity:Caucasian    HPI: Does pt already have a diagnosis of:  Osteopenia?  Yes Osteoporosis?  No  Back Pain?  No       Kyphosis?  No Prior fracture?  No Med(s) for Osteoporosis/Osteopenia:  evista 60mg  1 tablet daily since 2003 Med(s) previously tried for Osteoporosis/Osteopenia:  none                                                             PMH: Age at menopause:  Late 50's Hysterectomy?  No Oophorectomy?  No HRT? Yes - Former.  Type/duration: premarin Steroid Use?  No Thyroid med?  Yes History of cancer?  No History of digestive disorders (ie Crohn's)?  No Current or previous eating disorders?  No Last Vitamin D Result:  72 (2013) Last GFR Result:  55 (06/02/2013)   FH/SH: Family history of osteoporosis?  Yes - mother Parent with history of hip fracture?  Yes- mother Family history of breast cancer?  No Exercise?  No Smoking?  No Alcohol?  Yes - occasional glass of wine   Calcium Assessment Calcium Intake  # of servings/day  Calcium mg  Milk (8 oz) 1  x  300  = 300mg   Yogurt (4 oz) 0 x  200 = 0  Cheese (1 oz) 0.5 x  200 = 100mg   Other Calcium sources   250mg   Ca supplement MVI and Calcium daily = 1000mg    Estimated calcium intake per day 1650mg     DEXA Results Date of Test T-Score for AP Spine L1-L4 T-Score for Total Left Hip T-Score for Total Right Hip  07/16/2013 0.0 -1.1 -1.2  02/21/2012 -0.2 -1.0 -1.3  11/27/2007 0.1 -0.9 -1.0       Lowest T-Score was -1.8 at neck of right hip on 02/21/2012  FRAX 10 year estimate: Total FX risk:  26%  (consider medication if >/= 20%) Hip FX risk:  16%  (consider medication if >/= 3%)  Assessment: Osteopenia with stable BMD   Recommendations: 1.  Continue reloxifine (EVISTA) 60mg  1  tablet daily 2.  continue calcium 1200mg  daily through supplementation or diet.  3.  continue weight bearing exercise - 30 minutes at least 4 days per week.   4.  Counseled and educated about fall risk and prevention.  Recheck DEXA:  2 years  Time spent counseling patient:  20 minutes  Cherre Robins, PharmD, CPP

## 2013-08-26 ENCOUNTER — Other Ambulatory Visit: Payer: Self-pay | Admitting: Dermatology

## 2013-08-26 DIAGNOSIS — D239 Other benign neoplasm of skin, unspecified: Secondary | ICD-10-CM | POA: Diagnosis not present

## 2013-08-26 DIAGNOSIS — D485 Neoplasm of uncertain behavior of skin: Secondary | ICD-10-CM | POA: Diagnosis not present

## 2013-08-26 DIAGNOSIS — L820 Inflamed seborrheic keratosis: Secondary | ICD-10-CM | POA: Diagnosis not present

## 2013-09-08 ENCOUNTER — Encounter: Payer: Self-pay | Admitting: Nurse Practitioner

## 2013-09-08 ENCOUNTER — Ambulatory Visit (INDEPENDENT_AMBULATORY_CARE_PROVIDER_SITE_OTHER): Payer: Medicare Other | Admitting: Nurse Practitioner

## 2013-09-08 VITALS — HR 70 | Temp 98.2°F | Ht 62.5 in | Wt 165.2 lb

## 2013-09-08 DIAGNOSIS — M949 Disorder of cartilage, unspecified: Secondary | ICD-10-CM | POA: Diagnosis not present

## 2013-09-08 DIAGNOSIS — E785 Hyperlipidemia, unspecified: Secondary | ICD-10-CM | POA: Diagnosis not present

## 2013-09-08 DIAGNOSIS — M899 Disorder of bone, unspecified: Secondary | ICD-10-CM | POA: Diagnosis not present

## 2013-09-08 DIAGNOSIS — Z713 Dietary counseling and surveillance: Secondary | ICD-10-CM

## 2013-09-08 DIAGNOSIS — Z6829 Body mass index (BMI) 29.0-29.9, adult: Secondary | ICD-10-CM

## 2013-09-08 DIAGNOSIS — I10 Essential (primary) hypertension: Secondary | ICD-10-CM

## 2013-09-08 DIAGNOSIS — M8580 Other specified disorders of bone density and structure, unspecified site: Secondary | ICD-10-CM

## 2013-09-08 DIAGNOSIS — E038 Other specified hypothyroidism: Secondary | ICD-10-CM

## 2013-09-08 NOTE — Patient Instructions (Signed)

## 2013-09-08 NOTE — Progress Notes (Signed)
Subjective:    Patient ID: Nicole Wheeler, female    DOB: [birth year], 80 y.o.   MRN: [id number]  Patient here today for follow up of chronic medical problems- She is doing well overall-   Hyperlipidemia This is a chronic problem. The current episode started more than 1 year ago. The problem is controlled. Exacerbating diseases include hypothyroidism and obesity. She has no history of diabetes. There are no known factors aggravating her hyperlipidemia. Pertinent negatives include no chest pain, focal sensory loss, focal weakness, leg pain or shortness of breath. Current antihyperlipidemic treatment includes statins. The current treatment provides moderate improvement of lipids. Compliance problems include adherence to diet and adherence to exercise.   Thyroid Problem Presents for follow-up (hypothyroidism) visit. Patient reports no anxiety, constipation, depressed mood, diarrhea, heat intolerance, hoarse voice, leg swelling, nail problem, palpitations or tremors. Her past medical history is significant for hyperlipidemia. There is no history of diabetes.  Hypertension This is a new problem. Episode onset: less than 1 year. The problem has been resolved since onset. The problem is controlled. Associated symptoms include headaches. Pertinent negatives include no blurred vision, chest pain, malaise/fatigue, palpitations, peripheral edema or shortness of breath. There are no associated agents to hypertension. Risk factors for coronary artery disease include dyslipidemia and post-menopausal state. Past treatments include diuretics. The current treatment provides significant improvement. There are no compliance problems.  Hypertensive end-organ damage includes a thyroid problem.   * patient has been to the beach and a blistery spot came up on her right leg and it is a little sore to touch.  Review of Systems  Constitutional: Negative for fever, chills, malaise/fatigue and appetite change.  HENT: Positive  for congestion, postnasal drip, rhinorrhea and sinus pressure. Negative for hoarse voice, sore throat and trouble swallowing.   Eyes: Negative for blurred vision.  Respiratory: Positive for cough (nonproductive). Negative for shortness of breath.   Cardiovascular: Negative for chest pain and palpitations.  Gastrointestinal: Negative for diarrhea and constipation.  Endocrine: Negative for heat intolerance.  Neurological: Positive for dizziness and headaches. Negative for tremors and focal weakness.  All other systems reviewed and are negative.      Objective:   Physical Exam  Constitutional: She is oriented to person, place, and time. She appears well-developed and well-nourished.  HENT:  Right Ear: Hearing, tympanic membrane, external ear and ear canal normal.  Left Ear: Hearing, tympanic membrane, external ear and ear canal normal.  Nose: Mucosal edema and rhinorrhea present. Right sinus exhibits maxillary sinus tenderness. Right sinus exhibits no frontal sinus tenderness. Left sinus exhibits maxillary sinus tenderness. Left sinus exhibits no frontal sinus tenderness.  Mouth/Throat: Uvula is midline, oropharynx is clear and moist and mucous membranes are normal.  Small vesicular lesion on left upper post palate  Eyes: EOM are normal.  Neck: Trachea normal, normal range of motion and full passive range of motion without pain. Neck supple. No JVD present. Carotid bruit is not present. No thyromegaly present.  Cardiovascular: Normal rate, regular rhythm, normal heart sounds and intact distal pulses.  Exam reveals no gallop and no friction rub.   No murmur heard. Pulmonary/Chest: Effort normal and breath sounds normal.  Abdominal: Soft. Bowel sounds are normal. She exhibits no distension and no mass. There is no tenderness.  Musculoskeletal: Normal range of motion.  Lymphadenopathy:    She has no cervical adenopathy.  Neurological: She is alert and oriented to person, place, and time. She  has normal reflexes.  Skin: Skin is warm  and dry.  Psychiatric: She has a normal mood and affect. Her behavior is normal. Judgment and thought content normal.   Pulse 70  Temp(Src) 98.2 F (36.8 C) (Oral)  Ht 5' 2.5" (1.588 m)  Wt 165 lb 3.2 oz (74.934 kg)  BMI 29.72 kg/m2        Assessment & Plan:   1. Osteopenia   2. Other specified hypothyroidism   3. Essential hypertension   4. Hyperlipidemia    Orders Placed This Encounter  Procedures  . CMP14+EGFR  . NMR, lipoprofile   Call dentist and let him see throat lesion while it is there Labs pending Health maintenance reviewed Diet and exercise encouraged Continue all meds Follow up  In  3 months  [place], FNP

## 2013-09-09 LAB — NMR, LIPOPROFILE
CHOLESTEROL: 160 mg/dL (ref 100–199)
HDL Cholesterol by NMR: 55 mg/dL (ref >39)
HDL Particle Number: 32 umol/L (ref >=30.5)
LDL PARTICLE NUMBER: 996 nmol/L (ref <1000)
LDL SIZE: 20.8 nm (ref >20.5)
LDLC SERPL CALC-MCNC: 86 mg/dL (ref 0–99)
LP-IR SCORE: 27 (ref <=45)
SMALL LDL PARTICLE NUMBER: 314 nmol/L (ref <=527)
TRIGLYCERIDES BY NMR: 95 mg/dL (ref 0–149)

## 2013-09-09 LAB — CMP14+EGFR
A/G RATIO: 1.8 (ref 1.1–2.5)
ALBUMIN: 4.1 g/dL (ref 3.5–4.7)
ALK PHOS: 77 IU/L (ref 39–117)
ALT: 16 IU/L (ref 0–32)
AST: 19 IU/L (ref 0–40)
BILIRUBIN TOTAL: 0.3 mg/dL (ref 0.0–1.2)
BUN / CREAT RATIO: 20 (ref 11–26)
BUN: 20 mg/dL (ref 8–27)
CHLORIDE: 102 mmol/L (ref 97–108)
CO2: 26 mmol/L (ref 18–29)
Calcium: 9.4 mg/dL (ref 8.7–10.3)
Creatinine, Ser: 0.98 mg/dL (ref 0.57–1.00)
GFR, EST AFRICAN AMERICAN: 63 mL/min/{1.73_m2} (ref >59)
GFR, EST NON AFRICAN AMERICAN: 55 mL/min/{1.73_m2} — AB (ref >59)
Globulin, Total: 2.3 g/dL (ref 1.5–4.5)
Glucose: 97 mg/dL (ref 65–99)
POTASSIUM: 4.2 mmol/L (ref 3.5–5.2)
Sodium: 144 mmol/L (ref 134–144)
Total Protein: 6.4 g/dL (ref 6.0–8.5)

## 2013-09-11 ENCOUNTER — Telehealth: Payer: Self-pay | Admitting: Family Medicine

## 2013-09-11 NOTE — Telephone Encounter (Signed)
Message copied by Waverly Ferrari on Thu Sep 11, 2013  2:42 PM ------      Message from: Chevis Pretty      Created: Tue Sep 09, 2013  8:47 AM       Kidney and liver function stable      Cholesterol looks great      Continue current meds- low fat diet and exercise and recheck in 3 months       ------

## 2013-09-15 ENCOUNTER — Other Ambulatory Visit: Payer: Self-pay | Admitting: Nurse Practitioner

## 2013-09-30 ENCOUNTER — Other Ambulatory Visit: Payer: Self-pay | Admitting: Physician Assistant

## 2013-09-30 DIAGNOSIS — C4492 Squamous cell carcinoma of skin, unspecified: Secondary | ICD-10-CM

## 2013-09-30 DIAGNOSIS — L820 Inflamed seborrheic keratosis: Secondary | ICD-10-CM | POA: Diagnosis not present

## 2013-09-30 DIAGNOSIS — D485 Neoplasm of uncertain behavior of skin: Secondary | ICD-10-CM | POA: Diagnosis not present

## 2013-09-30 DIAGNOSIS — C44621 Squamous cell carcinoma of skin of unspecified upper limb, including shoulder: Secondary | ICD-10-CM | POA: Diagnosis not present

## 2013-09-30 DIAGNOSIS — D0470 Carcinoma in situ of skin of unspecified lower limb, including hip: Secondary | ICD-10-CM | POA: Diagnosis not present

## 2013-09-30 HISTORY — DX: Squamous cell carcinoma of skin, unspecified: C44.92

## 2013-10-08 DIAGNOSIS — Z961 Presence of intraocular lens: Secondary | ICD-10-CM | POA: Diagnosis not present

## 2013-10-08 DIAGNOSIS — H2510 Age-related nuclear cataract, unspecified eye: Secondary | ICD-10-CM | POA: Diagnosis not present

## 2013-10-08 LAB — HM DIABETES EYE EXAM

## 2013-11-06 DIAGNOSIS — C44621 Squamous cell carcinoma of skin of unspecified upper limb, including shoulder: Secondary | ICD-10-CM | POA: Diagnosis not present

## 2013-11-06 DIAGNOSIS — D0470 Carcinoma in situ of skin of unspecified lower limb, including hip: Secondary | ICD-10-CM | POA: Diagnosis not present

## 2013-11-26 ENCOUNTER — Other Ambulatory Visit: Payer: Self-pay | Admitting: Nurse Practitioner

## 2013-12-08 ENCOUNTER — Other Ambulatory Visit: Payer: Self-pay | Admitting: Nurse Practitioner

## 2014-01-08 DIAGNOSIS — Z23 Encounter for immunization: Secondary | ICD-10-CM | POA: Diagnosis not present

## 2014-01-13 ENCOUNTER — Other Ambulatory Visit: Payer: Self-pay | Admitting: Nurse Practitioner

## 2014-02-09 ENCOUNTER — Encounter: Payer: Self-pay | Admitting: Nurse Practitioner

## 2014-02-09 ENCOUNTER — Ambulatory Visit (INDEPENDENT_AMBULATORY_CARE_PROVIDER_SITE_OTHER): Payer: Medicare Other | Admitting: Nurse Practitioner

## 2014-02-09 VITALS — BP 128/64 | HR 77 | Temp 97.6°F | Ht 62.52 in | Wt 166.0 lb

## 2014-02-09 DIAGNOSIS — E785 Hyperlipidemia, unspecified: Secondary | ICD-10-CM | POA: Diagnosis not present

## 2014-02-09 DIAGNOSIS — M8580 Other specified disorders of bone density and structure, unspecified site: Secondary | ICD-10-CM

## 2014-02-09 DIAGNOSIS — I10 Essential (primary) hypertension: Secondary | ICD-10-CM

## 2014-02-09 DIAGNOSIS — E038 Other specified hypothyroidism: Secondary | ICD-10-CM | POA: Diagnosis not present

## 2014-02-09 MED ORDER — LEVOTHYROXINE SODIUM 100 MCG PO TABS: 100.0000 ug | ORAL_TABLET | Freq: Every day | ORAL | Status: DC

## 2014-02-09 MED ORDER — RALOXIFENE HCL 60 MG PO TABS: 60.0000 mg | ORAL_TABLET | Freq: Every day | ORAL | Status: DC

## 2014-02-09 MED ORDER — HYDROCHLOROTHIAZIDE 25 MG PO TABS: 25.0000 mg | ORAL_TABLET | Freq: Every day | ORAL | Status: DC

## 2014-02-09 NOTE — Patient Instructions (Signed)

## 2014-02-09 NOTE — Progress Notes (Signed)
Subjective:    Patient ID: Nicole Wheeler, female    DOB: 08-Dec-1930, 80 y.o.   MRN: [id number]  Patient here today for follow up of chronic medical problems- she reports stopping her crestor abotut five months ago due to muscle cramping. She is doing well overall- no acute complaint.    Hyperlipidemia This is a chronic problem. The current episode started more than 1 year ago. Recent lipid tests were reviewed and are normal. There are no known factors aggravating her hyperlipidemia. Associated symptoms include myalgias. Pertinent negatives include no chest pain or shortness of breath. Treatments tried: patient has stopped talking her crestor because of myalgia- was told to take QOD but she stopped it all together. Compliance problems include medication side effects.  Risk factors for coronary artery disease include post-menopausal, hypertension and dyslipidemia.  Thyroid Problem Presents for follow-up visit. Patient reports no constipation, diarrhea, heat intolerance, palpitations or tremors. The symptoms have been stable. Her past medical history is significant for hyperlipidemia.  Hypertension This is a chronic problem. The current episode started more than 1 year ago. The problem is controlled. Pertinent negatives include no blurred vision, chest pain, neck pain, orthopnea, palpitations or shortness of breath. Risk factors for coronary artery disease include post-menopausal state and dyslipidemia. Past treatments include diuretics. The current treatment provides significant improvement. There are no compliance problems.  Hypertensive end-organ damage includes a thyroid problem.  OSTEOPENIA: She is taking Evista 6o mg PO, no side effect reported.   Review of Systems  Constitutional: Negative for fever, chills and appetite change.  HENT: Positive for postnasal drip and sinus pressure. Negative for sore throat and trouble swallowing.   Eyes: Negative for blurred vision.  Respiratory: Negative for  shortness of breath. Cough: nonproductive.   Cardiovascular: Negative for chest pain, palpitations and orthopnea.  Gastrointestinal: Negative for diarrhea and constipation.  Endocrine: Negative for heat intolerance.  Musculoskeletal: Positive for myalgias. Negative for neck pain.  Neurological: Negative for tremors.  All other systems reviewed and are negative.      Objective:   Physical Exam  Constitutional: She is oriented to person, place, and time. She appears well-developed and well-nourished.  HENT:  Head: Normocephalic and atraumatic.  Right Ear: A foreign body (cerumen impaction) is present.  Left Ear: A foreign body (cerumen impaction) is present.  Mouth/Throat: Oropharynx is clear and moist.  Eyes: EOM are normal. Pupils are equal, round, and reactive to light.  Neck: Trachea normal, normal range of motion and full passive range of motion without pain. Neck supple. No JVD present. Carotid bruit is not present. No thyromegaly present.  Cardiovascular: Normal rate, regular rhythm, normal heart sounds and intact distal pulses.  Exam reveals no gallop and no friction rub.   No murmur heard. Pulmonary/Chest: Effort normal and breath sounds normal.  Abdominal: Soft. Bowel sounds are normal. She exhibits no distension and no mass. There is no tenderness.  Musculoskeletal: Normal range of motion.  Lymphadenopathy:    She has no cervical adenopathy.  Neurological: She is alert and oriented to person, place, and time. She has normal reflexes.  Skin: Skin is warm and dry.  Psychiatric: She has a normal mood and affect. Her behavior is normal. Judgment and thought content normal.   BP 128/64 mmHg  Pulse 77  Temp(Src) 97.6 F (36.4 C) (Oral)  Ht 5' 2.52" (1.588 m)  Wt 166 lb (75.297 kg)  BMI 29.86 kg/m2        Assessment & Plan:   1.  Essential hypertension Do not add salt to diet - CMP14+EGFR - hydrochlorothiazide (HYDRODIURIL) 25 MG tablet; Take 1 tablet (25 mg total) by  mouth daily.  Dispense: 30 tablet; Refill: 0  2. Other specified hypothyroidism - Thyroid Panel With TSH - levothyroxine (SYNTHROID, LEVOTHROID) 100 MCG tablet; Take 1 tablet (100 mcg total) by mouth daily.  Dispense: 90 tablet; Refill: 0  3. Hyperlipidemia Low fat diet and exercise - NMR, lipoprofile  4. Osteopenia Weight bearing exercises - raloxifene (EVISTA) 60 MG tablet; Take 1 tablet (60 mg total) by mouth daily.  Dispense: 90 tablet; Refill: 0    Labs pending Health maintenance reviewed Diet and exercise encouraged Continue all meds Follow up  In 3  month   [place], FNP

## 2014-02-10 LAB — CMP14+EGFR
A/G RATIO: 1.7 (ref 1.1–2.5)
ALBUMIN: 4.1 g/dL (ref 3.5–4.7)
ALT: 15 IU/L (ref 0–32)
AST: 19 IU/L (ref 0–40)
Alkaline Phosphatase: 70 IU/L (ref 39–117)
BILIRUBIN TOTAL: 0.3 mg/dL (ref 0.0–1.2)
BUN/Creatinine Ratio: 20 (ref 11–26)
BUN: 22 mg/dL (ref 8–27)
CALCIUM: 9.9 mg/dL (ref 8.7–10.3)
CO2: 27 mmol/L (ref 18–29)
CREATININE: 1.11 mg/dL — AB (ref 0.57–1.00)
Chloride: 101 mmol/L (ref 97–108)
GFR calc non Af Amer: 47 mL/min/{1.73_m2} — ABNORMAL LOW (ref >59)
GFR, EST AFRICAN AMERICAN: 54 mL/min/{1.73_m2} — AB (ref >59)
GLUCOSE: 96 mg/dL (ref 65–99)
Globulin, Total: 2.4 g/dL (ref 1.5–4.5)
Potassium: 4.5 mmol/L (ref 3.5–5.2)
Sodium: 142 mmol/L (ref 134–144)
Total Protein: 6.5 g/dL (ref 6.0–8.5)

## 2014-02-10 LAB — NMR, LIPOPROFILE
CHOLESTEROL: 197 mg/dL (ref 100–199)
HDL Cholesterol by NMR: 46 mg/dL (ref >39)
HDL Particle Number: 30.5 umol/L (ref >=30.5)
LDL Particle Number: 1416 nmol/L — ABNORMAL HIGH (ref <1000)
LDL SIZE: 21.7 nm (ref >20.5)
LDL-C: 120 mg/dL — ABNORMAL HIGH (ref 0–99)
LP-IR Score: 36 (ref <=45)
SMALL LDL PARTICLE NUMBER: 345 nmol/L (ref <=527)
Triglycerides by NMR: 155 mg/dL — ABNORMAL HIGH (ref 0–149)

## 2014-02-10 LAB — THYROID PANEL WITH TSH
Free Thyroxine Index: 3.3 (ref 1.2–4.9)
T3 UPTAKE RATIO: 32 % (ref 24–39)
T4 TOTAL: 10.2 ug/dL (ref 4.5–12.0)
TSH: 1.38 u[IU]/mL (ref 0.450–4.500)

## 2014-02-17 ENCOUNTER — Other Ambulatory Visit: Payer: Self-pay | Admitting: Nurse Practitioner

## 2014-02-19 ENCOUNTER — Other Ambulatory Visit: Payer: Self-pay | Admitting: Family Medicine

## 2014-02-19 DIAGNOSIS — Z1231 Encounter for screening mammogram for malignant neoplasm of breast: Secondary | ICD-10-CM

## 2014-03-19 ENCOUNTER — Ambulatory Visit (HOSPITAL_COMMUNITY)
Admission: RE | Admit: 2014-03-19 | Discharge: 2014-03-19 | Disposition: A | Discharge status: Home / Self Care | Payer: Medicare Other | Source: Ambulatory Visit | Attending: Family Medicine | Admitting: Family Medicine

## 2014-03-19 DIAGNOSIS — Z1231 Encounter for screening mammogram for malignant neoplasm of breast: Secondary | ICD-10-CM | POA: Diagnosis present

## 2014-03-25 DIAGNOSIS — H2512 Age-related nuclear cataract, left eye: Secondary | ICD-10-CM | POA: Diagnosis not present

## 2014-03-25 DIAGNOSIS — Z961 Presence of intraocular lens: Secondary | ICD-10-CM | POA: Diagnosis not present

## 2014-03-30 ENCOUNTER — Other Ambulatory Visit: Payer: Self-pay | Admitting: Nurse Practitioner

## 2014-03-31 NOTE — Telephone Encounter (Signed)
Last seen 02/09/14 MMM  This med not on EPIC list

## 2014-05-23 ENCOUNTER — Other Ambulatory Visit: Payer: Self-pay | Admitting: Nurse Practitioner

## 2014-05-26 ENCOUNTER — Encounter: Payer: Self-pay | Admitting: Nurse Practitioner

## 2014-05-26 ENCOUNTER — Ambulatory Visit (INDEPENDENT_AMBULATORY_CARE_PROVIDER_SITE_OTHER): Payer: Medicare Other | Admitting: Nurse Practitioner

## 2014-05-26 VITALS — BP 135/66 | HR 71 | Temp 97.1°F | Ht 62.0 in | Wt 166.0 lb

## 2014-05-26 DIAGNOSIS — M545 Low back pain: Secondary | ICD-10-CM | POA: Diagnosis not present

## 2014-05-26 DIAGNOSIS — R079 Chest pain, unspecified: Secondary | ICD-10-CM

## 2014-05-26 MED ORDER — METHYLPREDNISOLONE ACETATE 80 MG/ML IJ SUSP
80.0000 mg | Freq: Once | INTRAMUSCULAR | Status: AC
  Administered 2014-05-26: 80 mg via INTRAMUSCULAR

## 2014-05-26 NOTE — Progress Notes (Signed)
   Subjective:    Patient ID: Nicole Wheeler, female    DOB: 01/17/1931, 81 y.o.   MRN: [id number]  HPI Patient is here today complaining of body ache that started about a week ago. She complains of soreness upon awakening. She reports symptoms worsen as the day progress. Walking, pending, getting up and down makes the soreness worse. She reports intermittent headache. She denies fever. She has tried aleve and tylenol with moderate relief.  * episode of chest tightness like elephant sitting on her chest last night lasted about 52minutes- no SOB- burping helped relieve symptoms.  *She has reports dizziness with changing position, she reports this is not acute.   Review of Systems  Constitutional: Negative.   HENT: Negative.   Eyes: Negative.   Respiratory: Negative.   Cardiovascular: Negative.   Gastrointestinal: Negative.   Endocrine: Negative.   Genitourinary: Negative.   Musculoskeletal: Positive for back pain.  Skin: Negative.   Allergic/Immunologic: Negative.   Neurological: Negative.   Hematological: Negative.   Psychiatric/Behavioral: Negative.        Objective:   Physical Exam  Constitutional: She is oriented to person, place, and time. She appears well-developed and well-nourished.  HENT:  Head: Normocephalic.  Eyes: Pupils are equal, round, and reactive to light.  Neck: Normal range of motion.  Cardiovascular: Normal rate and regular rhythm.   Pulmonary/Chest: Effort normal and breath sounds normal.  Abdominal: Soft.  Musculoskeletal: Normal range of motion.  Soreness along low back  (-) SLR Motor strength and sensation distally intact  Neurological: She is alert and oriented to person, place, and time.  Skin: Skin is warm.  Psychiatric: She has a normal mood and affect. Her behavior is normal. Judgment and thought content normal.     BP 135/66 mmHg  Pulse 71  Temp(Src) 97.1 F (36.2 C) (Oral)  Ht 5\' 2"  (1.575 m)  Wt 166 lb (75.297 kg)  BMI 30.35  kg/m2  EKG: normal EKG, normal sinus rhythm, unchanged from previous tracings, PAC's noted. Mary-Margaret Hassell Done, FNP       Assessment & Plan:   1. Chest pain, unspecified chest pain type   2. Low back pain without sciatica, unspecified back pain laterality    Meds ordered this encounter  Medications  . methylPREDNISolone acetate (DEPO-MEDROL) injection 80 mg    Sig:    Range of motion Moist heat  RTO prn  Mary-Margaret Hassell Done, FNP

## 2014-05-26 NOTE — Patient Instructions (Signed)
Back Pain, Adult Low back pain is very common. About 1 in 5 people have back pain.The cause of low back pain is rarely dangerous. The pain often gets better over time.About half of people with a sudden onset of back pain feel better in just 2 weeks. About 8 in 10 people feel better by 6 weeks.  CAUSES Some common causes of back pain include:  Strain of the muscles or ligaments supporting the spine.  Wear and tear (degeneration) of the spinal discs.  Arthritis.  Direct injury to the back. DIAGNOSIS Most of the time, the direct cause of low back pain is not known.However, back pain can be treated effectively even when the exact cause of the pain is unknown.Answering your caregiver's questions about your overall health and symptoms is one of the most accurate ways to make sure the cause of your pain is not dangerous. If your caregiver needs more information, he or she may order lab work or imaging tests (X-rays or MRIs).However, even if imaging tests show changes in your back, this usually does not require surgery. HOME CARE INSTRUCTIONS For many people, back pain returns.Since low back pain is rarely dangerous, it is often a condition that people can learn to manageon their own.   Remain active. It is stressful on the back to sit or stand in one place. Do not sit, drive, or stand in one place for more than 30 minutes at a time. Take short walks on level surfaces as soon as pain allows.Try to increase the length of time you walk each day.  Do not stay in bed.Resting more than 1 or 2 days can delay your recovery.  Do not avoid exercise or work.Your body is made to move.It is not dangerous to be active, even though your back may hurt.Your back will likely heal faster if you return to being active before your pain is gone.  Pay attention to your body when you bend and lift. Many people have less discomfortwhen lifting if they bend their knees, keep the load close to their bodies,and  avoid twisting. Often, the most comfortable positions are those that put less stress on your recovering back.  Find a comfortable position to sleep. Use a firm mattress and lie on your side with your knees slightly bent. If you lie on your back, put a pillow under your knees.  Only take over-the-counter or prescription medicines as directed by your caregiver. Over-the-counter medicines to reduce pain and inflammation are often the most helpful.Your caregiver may prescribe muscle relaxant drugs.These medicines help dull your pain so you can more quickly return to your normal activities and healthy exercise.  Put ice on the injured area.  Put ice in a plastic bag.  Place a towel between your skin and the bag.  Leave the ice on for 15-20 minutes, 03-04 times a day for the first 2 to 3 days. After that, ice and heat may be alternated to reduce pain and spasms.  Ask your caregiver about trying back exercises and gentle massage. This may be of some benefit.  Avoid feeling anxious or stressed.Stress increases muscle tension and can worsen back pain.It is important to recognize when you are anxious or stressed and learn ways to manage it.Exercise is a great option. SEEK MEDICAL CARE IF:  You have pain that is not relieved with rest or medicine.  You have pain that does not improve in 1 week.  You have new symptoms.  You are generally not feeling well. SEEK   IMMEDIATE MEDICAL CARE IF:   You have pain that radiates from your back into your legs.  You develop new bowel or bladder control problems.  You have unusual weakness or numbness in your arms or legs.  You develop nausea or vomiting.  You develop abdominal pain.  You feel faint. Document Released: 02/27/2005 Document Revised: 08/29/2011 Document Reviewed: 07/01/2013 ExitCare Patient Information 2015 [employer]. This information is not intended to replace advice given to you by your health care provider. Make sure you  discuss any questions you have with your health care provider.  

## 2014-06-15 ENCOUNTER — Encounter: Payer: Self-pay | Admitting: Nurse Practitioner

## 2014-06-15 ENCOUNTER — Ambulatory Visit (INDEPENDENT_AMBULATORY_CARE_PROVIDER_SITE_OTHER): Payer: Medicare Other | Admitting: Nurse Practitioner

## 2014-06-15 VITALS — BP 142/88 | HR 75 | Temp 96.8°F | Ht 62.0 in | Wt 160.0 lb

## 2014-06-15 DIAGNOSIS — E038 Other specified hypothyroidism: Secondary | ICD-10-CM | POA: Diagnosis not present

## 2014-06-15 DIAGNOSIS — R531 Weakness: Secondary | ICD-10-CM

## 2014-06-15 DIAGNOSIS — M8580 Other specified disorders of bone density and structure, unspecified site: Secondary | ICD-10-CM | POA: Diagnosis not present

## 2014-06-15 DIAGNOSIS — Z23 Encounter for immunization: Secondary | ICD-10-CM

## 2014-06-15 DIAGNOSIS — E785 Hyperlipidemia, unspecified: Secondary | ICD-10-CM

## 2014-06-15 DIAGNOSIS — I10 Essential (primary) hypertension: Secondary | ICD-10-CM | POA: Diagnosis not present

## 2014-06-15 DIAGNOSIS — R7989 Other specified abnormal findings of blood chemistry: Secondary | ICD-10-CM | POA: Diagnosis not present

## 2014-06-15 DIAGNOSIS — R5383 Other fatigue: Secondary | ICD-10-CM | POA: Diagnosis not present

## 2014-06-15 DIAGNOSIS — G933 Postviral fatigue syndrome: Secondary | ICD-10-CM | POA: Diagnosis not present

## 2014-06-15 MED ORDER — RALOXIFENE HCL 60 MG PO TABS: 60.0000 mg | ORAL_TABLET | Freq: Every day | ORAL | Status: DC

## 2014-06-15 MED ORDER — HYDROCHLOROTHIAZIDE 25 MG PO TABS: 25.0000 mg | ORAL_TABLET | Freq: Every day | ORAL | Status: DC

## 2014-06-15 NOTE — Addendum Note (Signed)
Addended by: Rolena Infante on: 06/15/2014 11:02 AM   Modules accepted: Orders

## 2014-06-15 NOTE — Patient Instructions (Signed)

## 2014-06-15 NOTE — Progress Notes (Signed)
Subjective:    Patient ID: Nicole Wheeler, female    DOB: Aug 02, 1930, 81 y.o.   MRN: [id number]  Patient here today for follow up of chronic medical problems-  No acute complaint.  *pt reports she stopped taking the crestor due to muscle soreness. She reports it might be helping she will continue to monitor and update [place].   Hyperlipidemia This is a chronic problem. The current episode started more than 1 year ago. Recent lipid tests were reviewed and are normal. There are no known factors aggravating her hyperlipidemia. Pertinent negatives include no chest pain or shortness of breath. Treatments tried: patient has stopped talking her crestor because of myalgia- was told to take QOD but she stopped it all together. Compliance problems include medication side effects.  Risk factors for coronary artery disease include post-menopausal, hypertension and dyslipidemia.  Thyroid Problem Presents for follow-up visit. Patient reports no constipation, diarrhea, heat intolerance, palpitations or tremors. The symptoms have been stable. Her past medical history is significant for hyperlipidemia.  Hypertension This is a chronic problem. The current episode started more than 1 year ago. The problem is controlled. Pertinent negatives include no blurred vision, chest pain, neck pain, orthopnea, palpitations or shortness of breath. Risk factors for coronary artery disease include post-menopausal state and dyslipidemia. Past treatments include diuretics. The current treatment provides significant improvement. There are no compliance problems.  Hypertensive end-organ damage includes a thyroid problem.  OSTEOPENIA: She is taking Evista 6o mg PO, no side effect reported.   Review of Systems  Constitutional: Negative for fever, chills and appetite change.  HENT: Negative for sore throat and trouble swallowing.   Eyes: Negative for blurred vision.  Respiratory: Negative for shortness of breath. Cough: nonproductive.    Cardiovascular: Negative for chest pain, palpitations and orthopnea.  Gastrointestinal: Negative for diarrhea and constipation.  Endocrine: Negative for heat intolerance.  Musculoskeletal: Negative for neck pain.  Neurological: Negative for tremors.  All other systems reviewed and are negative.      Objective:   Physical Exam  Constitutional: She is oriented to person, place, and time. She appears well-developed and well-nourished.  HENT:  Head: Normocephalic and atraumatic.  Mouth/Throat: Oropharynx is clear and moist.  Eyes: EOM are normal. Pupils are equal, round, and reactive to light.  Neck: Trachea normal, normal range of motion and full passive range of motion without pain. Neck supple. No JVD present. Carotid bruit is not present. No thyromegaly present.  Cardiovascular: Normal rate, regular rhythm, normal heart sounds and intact distal pulses.  Exam reveals no gallop and no friction rub.   No murmur heard. Pulmonary/Chest: Effort normal and breath sounds normal.  Abdominal: Soft. Bowel sounds are normal. She exhibits no distension and no mass. There is no tenderness.  Musculoskeletal: Normal range of motion.  Lymphadenopathy:    She has no cervical adenopathy.  Neurological: She is alert and oriented to person, place, and time. She has normal reflexes.  Skin: Skin is warm and dry.  Psychiatric: She has a normal mood and affect. Her behavior is normal. Judgment and thought content normal.   BP 142/88 mmHg  Pulse 75  Temp(Src) 96.8 F (36 C) (Oral)  Ht 5' 2"  (1.575 m)  Wt 160 lb (72.576 kg)  BMI 29.26 kg/m2      Assessment & Plan:   1. Essential hypertension Low salt diet - CMP14+EGFR - hydrochlorothiazide (HYDRODIURIL) 25 MG tablet; Take 1 tablet (25 mg total) by mouth daily.  Dispense: 90 tablet; Refill: 1  2. Hyperlipidemia Low fat diet - NMR, lipoprofile  3. Osteopenia  - raloxifene (EVISTA) 60 MG tablet; Take 1 tablet (60 mg total) by mouth daily.   Dispense: 90 tablet; Refill: 1  4. Other specified hypothyroidism - Thyroid Panel With TSH  5. Weakness - Anemia Profile B   prevenar 13 today Labs pending Health maintenance reviewed Diet and exercise encouraged Continue all meds Follow up  In 3 months    [place], FNP

## 2014-06-16 LAB — CMP14+EGFR
ALK PHOS: 70 IU/L (ref 39–117)
ALT: 15 IU/L (ref 0–32)
AST: 20 IU/L (ref 0–40)
Albumin/Globulin Ratio: 1.6 (ref 1.1–2.5)
Albumin: 4.1 g/dL (ref 3.5–4.7)
BILIRUBIN TOTAL: 0.3 mg/dL (ref 0.0–1.2)
BUN / CREAT RATIO: 25 (ref 11–26)
BUN: 25 mg/dL (ref 8–27)
CHLORIDE: 101 mmol/L (ref 97–108)
CO2: 27 mmol/L (ref 18–29)
Calcium: 9.9 mg/dL (ref 8.7–10.3)
Creatinine, Ser: 1 mg/dL (ref 0.57–1.00)
GFR calc Af Amer: 61 mL/min/{1.73_m2} (ref >59)
GFR calc non Af Amer: 53 mL/min/{1.73_m2} — ABNORMAL LOW (ref >59)
Globulin, Total: 2.6 g/dL (ref 1.5–4.5)
Glucose: 97 mg/dL (ref 65–99)
Potassium: 4.4 mmol/L (ref 3.5–5.2)
SODIUM: 143 mmol/L (ref 134–144)
Total Protein: 6.7 g/dL (ref 6.0–8.5)

## 2014-06-16 LAB — NMR, LIPOPROFILE
Cholesterol: 199 mg/dL (ref 100–199)
HDL CHOLESTEROL BY NMR: 59 mg/dL (ref >39)
HDL Particle Number: 27.8 umol/L — ABNORMAL LOW (ref >=30.5)
LDL Particle Number: 1411 nmol/L — ABNORMAL HIGH (ref <1000)
LDL Size: 21.1 nm (ref >20.5)
LDL-C: 117 mg/dL — ABNORMAL HIGH (ref 0–99)
LP-IR Score: <25 (ref <=45)
Small LDL Particle Number: 523 nmol/L (ref <=527)
TRIGLYCERIDES BY NMR: 116 mg/dL (ref 0–149)

## 2014-06-16 LAB — ANEMIA PROFILE B
Basophils Absolute: 0 10*3/uL (ref 0.0–0.2)
Basos: 0 %
EOS ABS: 0.1 10*3/uL (ref 0.0–0.4)
Eos: 1 %
FERRITIN: 237 ng/mL — AB (ref 15–150)
FOLATE: 15.5 ng/mL (ref >3.0)
HEMATOCRIT: 40.6 % (ref 34.0–46.6)
Hemoglobin: 13.5 g/dL (ref 11.1–15.9)
IMMATURE GRANULOCYTES: 0 %
IRON SATURATION: 21 % (ref 15–55)
Immature Grans (Abs): 0 10*3/uL (ref 0.0–0.1)
Iron: 54 ug/dL (ref 27–139)
LYMPHS: 16 %
Lymphocytes Absolute: 1.5 10*3/uL (ref 0.7–3.1)
MCH: 31.2 pg (ref 26.6–33.0)
MCHC: 33.3 g/dL (ref 31.5–35.7)
MCV: 94 fL (ref 79–97)
MONOS ABS: 0.5 10*3/uL (ref 0.1–0.9)
Monocytes: 6 %
Neutrophils Absolute: 6.8 10*3/uL (ref 1.4–7.0)
Neutrophils Relative %: 77 %
PLATELETS: 344 10*3/uL (ref 150–379)
RBC: 4.33 x10E6/uL (ref 3.77–5.28)
RDW: 12.8 % (ref 12.3–15.4)
Retic Ct Pct: 0.7 % (ref 0.6–2.6)
Total Iron Binding Capacity: 257 ug/dL (ref 250–450)
UIBC: 203 ug/dL (ref 118–369)
Vitamin B-12: 1051 pg/mL — ABNORMAL HIGH (ref 211–946)
WBC: 9 10*3/uL (ref 3.4–10.8)

## 2014-06-16 LAB — THYROID PANEL WITH TSH
FREE THYROXINE INDEX: 2.8 (ref 1.2–4.9)
T3 UPTAKE RATIO: 28 % (ref 24–39)
T4, Total: 10.1 ug/dL (ref 4.5–12.0)
TSH: 1.67 u[IU]/mL (ref 0.450–4.500)

## 2014-06-17 NOTE — Progress Notes (Signed)
lmtcb

## 2014-06-18 ENCOUNTER — Other Ambulatory Visit: Payer: Self-pay | Admitting: Nurse Practitioner

## 2014-06-18 MED ORDER — ATORVASTATIN CALCIUM 40 MG PO TABS: 40.0000 mg | ORAL_TABLET | Freq: Every day | ORAL | Status: DC

## 2014-07-01 ENCOUNTER — Telehealth: Payer: Self-pay | Admitting: Nurse Practitioner

## 2014-07-01 NOTE — Telephone Encounter (Signed)
Pt called to talk about cholesterol medication. When she was seen on 4/1 informed nurse she was not taking Crestor d/t the muscle aches. Then she got a call from the pharmacy instead of our office about her changing to Lipitor instead. Encouraged to fill the Lipitor and take until her next visit to discuss her cholesterol with MMM again

## 2014-07-16 ENCOUNTER — Ambulatory Visit (INDEPENDENT_AMBULATORY_CARE_PROVIDER_SITE_OTHER): Payer: Medicare Other

## 2014-07-16 ENCOUNTER — Encounter: Payer: Self-pay | Admitting: Nurse Practitioner

## 2014-07-16 ENCOUNTER — Ambulatory Visit (INDEPENDENT_AMBULATORY_CARE_PROVIDER_SITE_OTHER): Payer: Medicare Other | Admitting: Nurse Practitioner

## 2014-07-16 VITALS — BP 153/73 | HR 73 | Temp 97.0°F | Ht 62.0 in | Wt 162.0 lb

## 2014-07-16 DIAGNOSIS — M25551 Pain in right hip: Secondary | ICD-10-CM | POA: Diagnosis not present

## 2014-07-16 MED ORDER — METHYLPREDNISOLONE ACETATE 80 MG/ML IJ SUSP
80.0000 mg | Freq: Once | INTRAMUSCULAR | Status: AC
  Administered 2014-07-16: 80 mg via INTRAMUSCULAR

## 2014-07-16 MED ORDER — TRAMADOL HCL 50 MG PO TABS: 50.0000 mg | ORAL_TABLET | Freq: Three times a day (TID) | ORAL | Status: DC | PRN

## 2014-07-16 NOTE — Patient Instructions (Signed)
Hip Pain Your hip is the joint between your upper legs and your lower pelvis. The bones, cartilage, tendons, and muscles of your hip joint perform a lot of work each day supporting your body weight and allowing you to move around. Hip pain can range from a minor ache to severe pain in one or both of your hips. Pain may be felt on the inside of the hip joint near the groin, or the outside near the buttocks and upper thigh. You may have swelling or stiffness as well.  HOME CARE INSTRUCTIONS   Take medicines only as directed by your health care provider.  Apply ice to the injured area:  Put ice in a plastic bag.  Place a towel between your skin and the bag.  Leave the ice on for 15-20 minutes at a time, 3-4 times a day.  Keep your leg raised (elevated) when possible to lessen swelling.  Avoid activities that cause pain.  Follow specific exercises as directed by your health care provider.  Sleep with a pillow between your legs on your most comfortable side.  Record how often you have hip pain, the location of the pain, and what it feels like. SEEK MEDICAL CARE IF:   You are unable to put weight on your leg.  Your hip is red or swollen or very tender to touch.  Your pain or swelling continues or worsens after 1 week.  You have increasing difficulty walking.  You have a fever. SEEK IMMEDIATE MEDICAL CARE IF:   You have fallen.  You have a sudden increase in pain and swelling in your hip. MAKE SURE YOU:   Understand these instructions.  Will watch your condition.  Will get help right away if you are not doing well or get worse. Document Released: 08/17/2009 Document Revised: 07/14/2013 Document Reviewed: 10/24/2012 ExitCare Patient Information 2015 [employer]. This information is not intended to replace advice given to you by your health care provider. Make sure you discuss any questions you have with your health care provider.  

## 2014-07-16 NOTE — Progress Notes (Signed)
   Subjective:    Patient ID: Nicole Wheeler, female    DOB: May 02, 1930, 81 y.o.   MRN: [id number]  HPI Patient in today c/o low back pain that started 2-3 months ago- Saw Dr. Arnoldo Morale who di her back surgery and has appointment for June 10th- Pain increased after starting lipitor, and pain seemed to spread- She stopped her lipitor and is feeing some better. She said that her right hip started hurting so bad last night that she could nt sleep. Pain in hip rated 6/10- pain increases with walking or laying down. Denies numbness or tingling distally. Has been taking aleve a nd tylenol which has not helped.    Review of Systems  Constitutional: Negative.   HENT: Negative.   Respiratory: Negative.   Cardiovascular: Negative.   Genitourinary: Negative.   Neurological: Negative.   Psychiatric/Behavioral: Negative.   All other systems reviewed and are negative.      Objective:   Physical Exam  Constitutional: She is oriented to person, place, and time. She appears well-developed and well-nourished. No distress.  Cardiovascular: Normal rate, regular rhythm and normal heart sounds.   Pulmonary/Chest: Effort normal and breath sounds normal.  Musculoskeletal:  Right hip pain on palpatiion- pain on internal rotatin and abduction.   Neurological: She is alert and oriented to person, place, and time.  Skin: Skin is warm and dry.  Psychiatric: She has a normal mood and affect. Her behavior is normal. Judgment and thought content normal.   BP 153/73 mmHg  Pulse 73  Temp(Src) 97 F (36.1 C) (Oral)  Ht 5\' 2"  (1.575 m)  Wt 162 lb (73.483 kg)  BMI 29.62 kg/m2  Right hip xray- osteoarthritis with joint space narrowing-Preliminary reading by Ronnald Collum, FNP  [facility]        Assessment & Plan:   1. Right hip pain    Meds ordered this encounter  Medications  . methylPREDNISolone acetate (DEPO-MEDROL) injection 80 mg    Sig:   . traMADol (ULTRAM) 50 MG tablet    Sig: Take 1 tablet (50 mg  total) by mouth every 8 (eight) hours as needed.    Dispense:  30 tablet    Refill:  0    Order Specific Question:  Supervising Provider    Answer:  Chipper Herb [[id number]]   Orders Placed This Encounter  Procedures  . DG HIP UNILAT WITH PELVIS 2-3 VIEWS RIGHT    Standing Status: Future     Number of Occurrences: 1     Standing Expiration Date: 09/14/2015    Order Specific Question:  Reason for Exam (SYMPTOM  OR DIAGNOSIS REQUIRED)    Answer:  right hip pain    Order Specific Question:  Preferred imaging location?    Answer:  Internal  . Ambulatory referral to Orthopedic Surgery    Referral Priority:  Routine    Referral Type:  Surgical    Referral Reason:  Specialty Services Required    Requested Specialty:  Orthopedic Surgery    Number of Visits Requested:  1  sedation precautions with ultram Moist heat Rest  rto prn  Mary-Margaret Hassell Done, FNP

## 2014-07-17 ENCOUNTER — Telehealth: Payer: Self-pay | Admitting: Nurse Practitioner

## 2014-07-17 NOTE — Telephone Encounter (Signed)
Patient aware and verbalize understanding. She was notified to call us back if it continues.

## 2014-07-17 NOTE — Telephone Encounter (Signed)
Only take tramadol at bedtime and continue tylenol an dotrin during the day

## 2014-07-17 NOTE — Telephone Encounter (Signed)
Patient states that she took a tramadol last night around 7 and done fine. This morning when she took her medication it has made her sick to her stomach. She is unable to keep anything done. She states that it did help her hip pain but she is having side effects from the tramadol causing her dizziness, nausea, vomiting. Please advise

## 2014-08-17 DIAGNOSIS — M1611 Unilateral primary osteoarthritis, right hip: Secondary | ICD-10-CM | POA: Diagnosis not present

## 2014-08-17 DIAGNOSIS — M76891 Other specified enthesopathies of right lower limb, excluding foot: Secondary | ICD-10-CM | POA: Diagnosis not present

## 2014-09-08 DIAGNOSIS — M545 Low back pain: Secondary | ICD-10-CM | POA: Diagnosis not present

## 2014-09-15 DIAGNOSIS — M1611 Unilateral primary osteoarthritis, right hip: Secondary | ICD-10-CM | POA: Diagnosis not present

## 2014-09-17 ENCOUNTER — Ambulatory Visit: Payer: Medicare Other | Attending: Neurosurgery | Admitting: Physical Therapy

## 2014-09-17 DIAGNOSIS — G8929 Other chronic pain: Secondary | ICD-10-CM | POA: Insufficient documentation

## 2014-09-17 DIAGNOSIS — M545 Low back pain, unspecified: Secondary | ICD-10-CM

## 2014-09-17 DIAGNOSIS — M2560 Stiffness of unspecified joint, not elsewhere classified: Secondary | ICD-10-CM | POA: Insufficient documentation

## 2014-09-17 DIAGNOSIS — M5386 Other specified dorsopathies, lumbar region: Secondary | ICD-10-CM

## 2014-09-17 NOTE — Therapy (Signed)
[facility] [place], [place], [address] Phone: [phone]   Fax:  [phone]  Physical Therapy Evaluation  Patient Details  Name: Nicole Wheeler MRN: [id number] Date of Birth: 26-Nov-1930 Referring Provider:  Newman Pies, MD  Encounter Date: 09/17/2014      PT End of Session - 09/17/14 1157    Visit Number 1   Number of Visits 12   Date for PT Re-Evaluation 10/29/14   PT Start Time 1115   PT Stop Time 1203   PT Time Calculation (min) 48 min   Activity Tolerance Patient tolerated treatment well   Behavior During Therapy [facility] for tasks assessed/performed      Past Medical History  Diagnosis Date  . Arthritis   . Hypothyroidism     dr don Laurance Flatten   pcp  . Hypertension   . Hyperlipidemia     Past Surgical History  Procedure Laterality Date  . Throidectomy    . Appendectomy    . Lumbar laminectomy/decompression microdiscectomy  12/07/2011    Procedure: LUMBAR LAMINECTOMY/DECOMPRESSION MICRODISCECTOMY 1 LEVEL;  Surgeon: Ophelia Charter, MD;  Location: [facility];  Service: Neurosurgery;  Laterality: Right;  Right Lumbar Five-Sacral One Diskectomy    There were no vitals filed for this visit.  Visit Diagnosis:  Chronic low back pain - Plan: PT plan of care cert/re-cert  Decreased range of motion of lumbar spine - Plan: PT plan of care cert/re-cert      Subjective Assessment - 09/17/14 1141    Subjective Butt hurts a lot after sitting.   Limitations Sitting   How long can you sit comfortably? 20 minutes.   Patient Stated Goals Get out of pain.            [facility] PT Assessment - 09/17/14 0001    Assessment   Medical Diagnosis hronic low back pain.   Precautions   Precautions None   Restrictions   Weight Bearing Restrictions No   Balance Screen   Has the patient fallen in the past 6 months No   Has the patient had a decrease in activity level because of a fear of falling?  Yes   Is the patient  reluctant to leave their home because of a fear of falling?  No   Home Ecologist residence   Prior Function   Level of Independence Independent   Cognition   Overall Cognitive Status Within Functional Limits for tasks assessed   Posture/Postural Control   Posture Comments Decreased lumbar lordosis.   ROM / Strength   AROM / PROM / Strength AROM;Strength   AROM   Overall AROM Comments Active lumbar extnesion limited to 10 degrees and flexion decreased by 50%.  Pain reproduction with right hip IR/ER.   Strength   Overall Strength Comments Normal bilateral LE strength.   Flexibility   Soft Tissue Assessment /Muscle Length --  Right hamstring (SLR)= 48 degrees.   Palpation   Palpation comment Tender over right SIJ; Piriformis area and right ischial tuberosity region.   Special Tests    Special Tests Lumbar;Sacrolliac Tests;Leg LengthTest   Lumbar Tests --  Negative SLR testing.  2+/4+ Pat DTR's and 1+/4+ Achilles.   Sacroiliac Tests  --  Positive right FABER test.   Leg length test  --  Equal leg lengths.   Ambulation/Gait   Gait Comments WNL.  [facility] Adult PT Treatment/Exercise - 14-Oct-2014 0001    Modalities   Modalities Electrical Stimulation   Electrical Stimulation   Electrical Stimulation Location Right SIJ/Piriformis region   Electrical Stimulation Action Constant Pre-mod e' stim x 15 minutes at 80-150 HZ.                PT Education - 14-Oct-2014 1143    Education provided Yes   Person(s) Educated Patient   Methods Explanation   Comprehension Verbalized understanding          PT Short Term Goals - Oct 14, 2014 1154    PT SHORT TERM GOAL #1   Title Ind with initial HEP.           PT Long Term Goals - 2014/10/14 1154    PT LONG TERM GOAL #1   Title Patient sit 30 minutes with pain not > 3/10.   Time 4   Period Weeks   Status New   PT LONG TERM GOAL #2   Title Patient drive 45 minutes with pain  not > 3/10.   Time 4   Period Weeks   Status New   PT LONG TERM GOAL #3   Title Perform ADL's with pain not > 3/10.               Plan - 10/14/2014 1148    Clinical Impression Statement Patient states that in April of 2016 she began to experience increasing right hip and buttock pain.  That began after long sessions of sitting.  Driving and ascending stairs increases her pain as well.  Her resting pain-level is a low 1-2/10 but goes to 6-7+/10 after sitting for long periods of time.   Pt will benefit from skilled therapeutic intervention in order to improve on the following deficits Pain;Decreased activity tolerance;Decreased range of motion   Rehab Potential Good   PT Frequency 3x / week   PT Duration 6 weeks   PT Treatment/Interventions ADLs/Self Care Home Management;Electrical Stimulation;Ultrasound;Therapeutic activities;Therapeutic exercise;Manual techniques   PT Next Visit Plan Left SDLY position with pillow between knees:  Please perform Combo to right SIJ/gluteal region f/b STW/M.  MD referral states extension based lumbar exercises also.          G-Codes - 2014/10/14 1156    Functional Assessment Tool Used FOTO.   Functional Limitation Mobility: Walking and moving around   Mobility: Walking and Moving Around Goal Status [phone]) At least 40 percent but less than 60 percent impaired, limited or restricted   Mobility: Walking and Moving Around Discharge Status [phone]) At least 20 percent but less than 40 percent impaired, limited or restricted       Problem List Patient Active Problem List   Diagnosis Date Noted  . Hypertension 06/02/2013  . Hypothyroidism 11/08/2012  . Hyperlipidemia 11/08/2012  . Osteopenia 11/08/2012    APP[place], [place] MPT 2014/10/14, 12:05 PM  [facility] [address] [place], [place], [address] Phone: [phone]   Fax:  [phone]

## 2014-09-17 NOTE — Patient Instructions (Signed)
Instructed patient in sleeping posture and lumbar roll usage while sittiing.

## 2014-09-22 ENCOUNTER — Ambulatory Visit: Payer: Medicare Other | Admitting: *Deleted

## 2014-09-22 ENCOUNTER — Encounter: Payer: Self-pay | Admitting: *Deleted

## 2014-09-22 DIAGNOSIS — M545 Low back pain, unspecified: Secondary | ICD-10-CM

## 2014-09-22 DIAGNOSIS — M5386 Other specified dorsopathies, lumbar region: Secondary | ICD-10-CM

## 2014-09-22 DIAGNOSIS — G8929 Other chronic pain: Secondary | ICD-10-CM | POA: Diagnosis not present

## 2014-09-22 DIAGNOSIS — M2560 Stiffness of unspecified joint, not elsewhere classified: Secondary | ICD-10-CM | POA: Diagnosis not present

## 2014-09-22 NOTE — Therapy (Signed)
[facility] [place], [place], [address] Phone: [phone]   Fax:  [phone]  Physical Therapy Treatment  Patient Details  Name: Nicole Wheeler MRN: [id number] Date of Birth: 03-08-1931 Referring Provider:  Chevis Pretty, *  Encounter Date: 09/22/2014      PT End of Session - 09/22/14 1111    Visit Number 2   Number of Visits 12   Date for PT Re-Evaluation 10/29/14   PT Start Time 1030   PT Stop Time 1119   PT Time Calculation (min) 49 min      Past Medical History  Diagnosis Date  . Arthritis   . Hypothyroidism     dr don Laurance Flatten   pcp  . Hypertension   . Hyperlipidemia     Past Surgical History  Procedure Laterality Date  . Throidectomy    . Appendectomy    . Lumbar laminectomy/decompression microdiscectomy  12/07/2011    Procedure: LUMBAR LAMINECTOMY/DECOMPRESSION MICRODISCECTOMY 1 LEVEL;  Surgeon: Ophelia Charter, MD;  Location: [facility];  Service: Neurosurgery;  Laterality: Right;  Right Lumbar Five-Sacral One Diskectomy    There were no vitals filed for this visit.  Visit Diagnosis:  Chronic low back pain  Decreased range of motion of lumbar spine      Subjective Assessment - 09/22/14 1030    Subjective Butt hurts a lot after sitting.   Limitations Sitting   How long can you sit comfortably? 20 minutes.   Patient Stated Goals Get out of pain.   Currently in Pain? Yes   Pain Score 3    Pain Location Back   Pain Orientation Lower;Right   Pain Descriptors / Indicators Sore   Pain Type Chronic pain   Pain Onset More than a month ago   Pain Frequency Intermittent   Aggravating Factors  sitting, walking   Pain Relieving Factors lying down                         [facility] Adult PT Treatment/Exercise - 09/22/14 0001    Exercises   Exercises Lumbar;Knee/Hip   Lumbar Exercises: Stretches   Piriformis Stretch 5 reps;10 seconds  RT HIP   Piriformis Stretch Limitations  very tight. Limited motion   Modalities   Modalities Electrical Stimulation;Ultrasound   Acupuncturist Location Right SIJ/Piriformis region   Electrical Stimulation Action 80-150hz  x 15 mins in hooklying   Ultrasound   Ultrasound Location RT SIJ   Ultrasound Parameters 1.5 w/cm2 x 10 mins with Pt LT sidelying   Ultrasound Goals Pain   Manual Therapy   Manual Therapy Soft tissue mobilization;Myofascial release   Myofascial Release STW/ TPR to RT SIJ area and into glutes and piriformis all with Pt LT sidelying                  PT Short Term Goals - 09/17/14 1154    PT SHORT TERM GOAL #1   Title Ind with initial HEP.           PT Long Term Goals - 09/17/14 1154    PT LONG TERM GOAL #1   Title Patient sit 30 minutes with pain not > 3/10.   Time 4   Period Weeks   Status New   PT LONG TERM GOAL #2   Title Patient drive 45 minutes with pain not > 3/10.   Time 4   Period Weeks   Status New  PT LONG TERM GOAL #3   Title Perform ADL's with pain not > 3/10.               Plan - 09/22/14 1112    Clinical Impression Statement Pt did fairly well with Rx today and felt like it helped some with decreased tightness in RT hip. She had notable tightness in Rt glute and piriformis area, but had some releases with STW and TPR. Pt performed pirifomis stretch, but was challenged due to tightness and  pain   Pt will benefit from skilled therapeutic intervention in order to improve on the following deficits Pain;Decreased activity tolerance;Decreased range of motion   Rehab Potential Good   PT Frequency 3x / week   PT Duration 6 weeks   PT Treatment/Interventions ADLs/Self Care Home Management;Electrical Stimulation;Ultrasound;Therapeutic activities;Therapeutic exercise;Manual techniques   PT Next Visit Plan Left SDLY position with pillow between knees:  Please perform Combo to right SIJ/gluteal region f/b STW/M.  MD referral states  extension based lumbar exercises also.        Problem List Patient Active Problem List   Diagnosis Date Noted  . Hypertension 06/02/2013  . Hypothyroidism 11/08/2012  . Hyperlipidemia 11/08/2012  . Osteopenia 11/08/2012    RAMSEUR,CHRIS, PTA 09/22/2014, 11:48 AM  [facility] [address] [place], [place], [address] Phone: [phone]   Fax:  [phone]

## 2014-09-23 ENCOUNTER — Encounter: Payer: Self-pay | Admitting: Nurse Practitioner

## 2014-09-23 ENCOUNTER — Ambulatory Visit (INDEPENDENT_AMBULATORY_CARE_PROVIDER_SITE_OTHER): Payer: Medicare Other | Admitting: Nurse Practitioner

## 2014-09-23 VITALS — BP 127/66 | HR 74 | Temp 97.5°F | Ht 62.0 in | Wt 154.6 lb

## 2014-09-23 DIAGNOSIS — M8580 Other specified disorders of bone density and structure, unspecified site: Secondary | ICD-10-CM | POA: Diagnosis not present

## 2014-09-23 DIAGNOSIS — E038 Other specified hypothyroidism: Secondary | ICD-10-CM

## 2014-09-23 DIAGNOSIS — M25551 Pain in right hip: Secondary | ICD-10-CM | POA: Diagnosis not present

## 2014-09-23 DIAGNOSIS — I10 Essential (primary) hypertension: Secondary | ICD-10-CM | POA: Diagnosis not present

## 2014-09-23 DIAGNOSIS — E785 Hyperlipidemia, unspecified: Secondary | ICD-10-CM

## 2014-09-23 MED ORDER — KETOROLAC TROMETHAMINE 60 MG/2ML IM SOLN
60.0000 mg | Freq: Once | INTRAMUSCULAR | Status: AC
  Administered 2014-09-23: 60 mg via INTRAMUSCULAR

## 2014-09-23 MED ORDER — LEVOTHYROXINE SODIUM 100 MCG PO TABS: 100.0000 ug | ORAL_TABLET | Freq: Every day | ORAL | Status: DC

## 2014-09-23 MED ORDER — HYDROCHLOROTHIAZIDE 25 MG PO TABS: 25.0000 mg | ORAL_TABLET | Freq: Every day | ORAL | Status: DC

## 2014-09-23 NOTE — Addendum Note (Signed)
Addended by: Chevis Pretty on: 09/23/2014 03:14 PM   Modules accepted: Level of Service

## 2014-09-23 NOTE — Progress Notes (Signed)
Subjective:    Patient ID: Nicole Wheeler, female    DOB: [birth year], 81 y.o.   MRN: [id number]  Patient here today for follow up of chronic medical problems-  No acute complaint.   *pt reports she stopped taking the crestor due to muscle soreness. She reports it might be helping she will continue to monitor and update [place].   Hypertension This is a chronic problem. The current episode started more than 1 year ago. The problem is controlled. Pertinent negatives include no blurred vision, chest pain, neck pain, orthopnea, palpitations or shortness of breath. Risk factors for coronary artery disease include post-menopausal state and dyslipidemia. Past treatments include diuretics. The current treatment provides significant improvement. There are no compliance problems.  Hypertensive end-organ damage includes a thyroid problem.  Hyperlipidemia This is a chronic problem. The current episode started more than 1 year ago. Recent lipid tests were reviewed and are normal. There are no known factors aggravating her hyperlipidemia. Pertinent negatives include no chest pain or shortness of breath. Treatments tried: patient has stopped talking her crestor because of myalgia- was told to take QOD but she stopped it all together. Compliance problems include medication side effects.  Risk factors for coronary artery disease include post-menopausal, hypertension and dyslipidemia.  Thyroid Problem Presents for follow-up visit. Patient reports no constipation, diarrhea, heat intolerance, palpitations or tremors. The symptoms have been stable. Her past medical history is significant for hyperlipidemia.  OSTEOPENIA: She is taking Evista 6o mg PO, no side effect reported.  Hip pain Has been complaining for over 2 months- has seen specialist- Dr. Nelva Bush and Dr. Arnoldo Morale. SHe has had a shot in her groin area which has helped a little and recently started therapy and has had 2 sessions. Therapy from yesterday increaed her pain  today. STill takes ultram if needed.     Review of Systems  Constitutional: Negative for fever, chills and appetite change.  HENT: Negative for sore throat and trouble swallowing.   Eyes: Negative for blurred vision.  Respiratory: Negative for shortness of breath. Cough: nonproductive.   Cardiovascular: Negative for chest pain, palpitations and orthopnea.  Gastrointestinal: Negative for diarrhea and constipation.  Endocrine: Negative for heat intolerance.  Musculoskeletal: Negative for neck pain.  Neurological: Negative for tremors.  All other systems reviewed and are negative.      Objective:   Physical Exam  Constitutional: She is oriented to person, place, and time. She appears well-developed and well-nourished.  HENT:  Head: Normocephalic and atraumatic.  Mouth/Throat: Oropharynx is clear and moist.  Eyes: EOM are normal. Pupils are equal, round, and reactive to light.  Neck: Trachea normal, normal range of motion and full passive range of motion without pain. Neck supple. No JVD present. Carotid bruit is not present. No thyromegaly present.  Cardiovascular: Normal rate, regular rhythm, normal heart sounds and intact distal pulses.  Exam reveals no gallop and no friction rub.   No murmur heard. Pulmonary/Chest: Effort normal and breath sounds normal.  Abdominal: Soft. Bowel sounds are normal. She exhibits no distension and no mass. There is no tenderness.  Musculoskeletal: Normal range of motion.  Lymphadenopathy:    She has no cervical adenopathy.  Neurological: She is alert and oriented to person, place, and time. She has normal reflexes.  Skin: Skin is warm and dry.  Psychiatric: She has a normal mood and affect. Her behavior is normal. Judgment and thought content normal.   BP 127/66 mmHg  Pulse 74  Temp(Src) 97.5 F (36.4 C) (  Oral)  Ht 5' 2"  (1.575 m)  Wt 154 lb 9.6 oz (70.126 kg)  BMI 28.27 kg/m2      Assessment & Plan:   1. Essential hypertension Do not  add salt to diet - CMP14+EGFR - hydrochlorothiazide (HYDRODIURIL) 25 MG tablet; Take 1 tablet (25 mg total) by mouth daily.  Dispense: 90 tablet; Refill: 1  2. Other specified hypothyroidism - levothyroxine (SYNTHROID, LEVOTHROID) 100 MCG tablet; Take 1 tablet (100 mcg total) by mouth daily.  Dispense: 90 tablet; Refill: 2 - Thyroid Panel With TSH  3. Osteopenia Weight bearing exercise  4. Hyperlipidemia Low fat diet - Lipid panel  5. Hip pain, right Continue physical therapy - ketorolac (TORADOL) injection 60 mg; Inject 2 mLs (60 mg total) into the muscle once.   Need pelvic exam Labs pending Health maintenance reviewed Diet and exercise encouraged Continue all meds Follow up  In 3 months   [place], FNP

## 2014-09-23 NOTE — Patient Instructions (Signed)
Hip Pain Your hip is the joint between your upper legs and your lower pelvis. The bones, cartilage, tendons, and muscles of your hip joint perform a lot of work each day supporting your body weight and allowing you to move around. Hip pain can range from a minor ache to severe pain in one or both of your hips. Pain may be felt on the inside of the hip joint near the groin, or the outside near the buttocks and upper thigh. You may have swelling or stiffness as well.  HOME CARE INSTRUCTIONS   Take medicines only as directed by your health care provider.  Apply ice to the injured area:  Put ice in a plastic bag.  Place a towel between your skin and the bag.  Leave the ice on for 15-20 minutes at a time, 3-4 times a day.  Keep your leg raised (elevated) when possible to lessen swelling.  Avoid activities that cause pain.  Follow specific exercises as directed by your health care provider.  Sleep with a pillow between your legs on your most comfortable side.  Record how often you have hip pain, the location of the pain, and what it feels like. SEEK MEDICAL CARE IF:   You are unable to put weight on your leg.  Your hip is red or swollen or very tender to touch.  Your pain or swelling continues or worsens after 1 week.  You have increasing difficulty walking.  You have a fever. SEEK IMMEDIATE MEDICAL CARE IF:   You have fallen.  You have a sudden increase in pain and swelling in your hip. MAKE SURE YOU:   Understand these instructions.  Will watch your condition.  Will get help right away if you are not doing well or get worse. Document Released: 08/17/2009 Document Revised: 07/14/2013 Document Reviewed: 10/24/2012 ExitCare Patient Information 2015 [employer]. This information is not intended to replace advice given to you by your health care provider. Make sure you discuss any questions you have with your health care provider.  

## 2014-09-24 LAB — CMP14+EGFR
A/G RATIO: 1.4 (ref 1.1–2.5)
ALBUMIN: 3.7 g/dL (ref 3.5–4.7)
ALT: 16 IU/L (ref 0–32)
AST: 18 IU/L (ref 0–40)
Alkaline Phosphatase: 65 IU/L (ref 39–117)
BILIRUBIN TOTAL: 0.2 mg/dL (ref 0.0–1.2)
BUN/Creatinine Ratio: 30 — ABNORMAL HIGH (ref 11–26)
BUN: 32 mg/dL — ABNORMAL HIGH (ref 8–27)
CALCIUM: 9.6 mg/dL (ref 8.7–10.3)
CHLORIDE: 97 mmol/L (ref 97–108)
CO2: 24 mmol/L (ref 18–29)
Creatinine, Ser: 1.05 mg/dL — ABNORMAL HIGH (ref 0.57–1.00)
GFR calc non Af Amer: 50 mL/min/{1.73_m2} — ABNORMAL LOW (ref >59)
GFR, EST AFRICAN AMERICAN: 58 mL/min/{1.73_m2} — AB (ref >59)
GLOBULIN, TOTAL: 2.7 g/dL (ref 1.5–4.5)
Glucose: 96 mg/dL (ref 65–99)
POTASSIUM: 4.7 mmol/L (ref 3.5–5.2)
Sodium: 140 mmol/L (ref 134–144)
Total Protein: 6.4 g/dL (ref 6.0–8.5)

## 2014-09-24 LAB — LIPID PANEL
CHOL/HDL RATIO: 4 ratio (ref 0.0–4.4)
Cholesterol, Total: 202 mg/dL — ABNORMAL HIGH (ref 100–199)
HDL: 50 mg/dL (ref >39)
LDL Calculated: 128 mg/dL — ABNORMAL HIGH (ref 0–99)
Triglycerides: 122 mg/dL (ref 0–149)
VLDL CHOLESTEROL CAL: 24 mg/dL (ref 5–40)

## 2014-09-24 LAB — THYROID PANEL WITH TSH
FREE THYROXINE INDEX: 3.2 (ref 1.2–4.9)
T3 Uptake Ratio: 34 % (ref 24–39)
T4, Total: 9.4 ug/dL (ref 4.5–12.0)
TSH: 1.23 u[IU]/mL (ref 0.450–4.500)

## 2014-09-28 ENCOUNTER — Telehealth: Payer: Self-pay | Admitting: Nurse Practitioner

## 2014-09-28 NOTE — Telephone Encounter (Signed)
Spoke with patient and results given. 

## 2014-09-29 ENCOUNTER — Ambulatory Visit: Payer: Medicare Other | Admitting: *Deleted

## 2014-09-29 ENCOUNTER — Encounter: Payer: Self-pay | Admitting: Nurse Practitioner

## 2014-09-29 ENCOUNTER — Encounter: Payer: Self-pay | Admitting: *Deleted

## 2014-09-29 ENCOUNTER — Ambulatory Visit (INDEPENDENT_AMBULATORY_CARE_PROVIDER_SITE_OTHER): Payer: Medicare Other | Admitting: Nurse Practitioner

## 2014-09-29 DIAGNOSIS — M5386 Other specified dorsopathies, lumbar region: Secondary | ICD-10-CM

## 2014-09-29 DIAGNOSIS — M2560 Stiffness of unspecified joint, not elsewhere classified: Secondary | ICD-10-CM | POA: Diagnosis not present

## 2014-09-29 DIAGNOSIS — G8929 Other chronic pain: Secondary | ICD-10-CM

## 2014-09-29 DIAGNOSIS — Z01419 Encounter for gynecological examination (general) (routine) without abnormal findings: Secondary | ICD-10-CM

## 2014-09-29 DIAGNOSIS — M545 Low back pain, unspecified: Secondary | ICD-10-CM

## 2014-09-29 LAB — POCT UA - MICROSCOPIC ONLY
Bacteria, U Microscopic: NEGATIVE
CRYSTALS, UR, HPF, POC: NEGATIVE
Casts, Ur, LPF, POC: NEGATIVE
RBC, urine, microscopic: NEGATIVE
Yeast, UA: NEGATIVE

## 2014-09-29 LAB — POCT URINALYSIS DIPSTICK
Bilirubin, UA: NEGATIVE
Blood, UA: NEGATIVE
Glucose, UA: NEGATIVE
Ketones, UA: NEGATIVE
Leukocytes, UA: NEGATIVE
Nitrite, UA: NEGATIVE
PROTEIN UA: NEGATIVE
SPEC GRAV UA: 1.015
Urobilinogen, UA: NEGATIVE
pH, UA: 6

## 2014-09-29 NOTE — Therapy (Signed)
[facility] [place], [place], [address] Phone: [phone]   Fax:  [phone]  Physical Therapy Treatment  Patient Details  Name: Nicole Wheeler MRN: [id number] Date of Birth: [birth year] Referring Provider:  Chevis Pretty, *  Encounter Date: 09/29/2014      PT End of Session - 09/29/14 1035    Visit Number 3   Number of Visits 12   Date for PT Re-Evaluation 10/29/14   PT Start Time 1030   PT Stop Time 1117   PT Time Calculation (min) 47 min      Past Medical History  Diagnosis Date  . Arthritis   . Hypothyroidism     dr don Laurance Flatten   pcp  . Hypertension   . Hyperlipidemia     Past Surgical History  Procedure Laterality Date  . Throidectomy    . Appendectomy    . Lumbar laminectomy/decompression microdiscectomy  12/07/2011    Procedure: LUMBAR LAMINECTOMY/DECOMPRESSION MICRODISCECTOMY 1 LEVEL;  Surgeon: Ophelia Charter, MD;  Location: [place] [facility];  Service: Neurosurgery;  Laterality: Right;  Right Lumbar Five-Sacral One Diskectomy    There were no vitals filed for this visit.  Visit Diagnosis:  Chronic low back pain  Decreased range of motion of lumbar spine      Subjective Assessment - 09/29/14 1311    Subjective Still having pain and soreness Rt hip and LB. Pain radiates into my groin area and aches. Unable to do stretches for RT hip due to pain   Limitations Sitting   Patient Stated Goals Get out of pain.   Pain Score 5    Pain Location Back   Pain Orientation Right   Pain Descriptors / Indicators Sore;Aching   Pain Type Chronic pain   Pain Onset More than a month ago   Pain Frequency Intermittent   Aggravating Factors  sitting, walking   Pain Relieving Factors lying down                         [facility] Adult PT Treatment/Exercise - 09/29/14 0001    Modalities   Modalities Electrical Stimulation;Ultrasound   Electrical Stimulation   Electrical Stimulation Location  Right SIJ/Piriformis region   Electrical Stimulation Action  Premod 80-150hz  x 15 min in hooklying   Ultrasound   Ultrasound Location RT SIJ   Ultrasound Parameters 1.5 w/cm2 x 10 min in LT side lying   Ultrasound Goals Pain   Manual Therapy   Manual Therapy Soft tissue mobilization;Myofascial release;Passive ROM   Myofascial Release STW/ TPR to RT SIJ area and into glutes and piriformis all with Pt LT sidelying   Passive ROM AAROM for  RT hip ABD 3x10 in LT sidelying                  PT Short Term Goals - 09/17/14 1154    PT SHORT TERM GOAL #1   Title Ind with initial HEP.           PT Long Term Goals - 09/17/14 1154    PT LONG TERM GOAL #1   Title Patient sit 30 minutes with pain not > 3/10.   Time 4   Period Weeks   Status New   PT LONG TERM GOAL #2   Title Patient drive 45 minutes with pain not > 3/10.   Time 4   Period Weeks   Status New   PT LONG TERM GOAL #3   Title  Perform ADL's with pain not > 3/10.               Plan - 09/29/14 1324    Clinical Impression Statement Pt did good with Rx today with some decreased pain afterwards, but continues to have referred  groin pain, RT hip and LB pain. She has notable tightness in LB paras, RT piriformis and glute. Good releases with STW/TPR.   She continues to have weak Abductors and was unable to Abduct against gravity. 3-/5     Pt will benefit from skilled therapeutic intervention in order to improve on the following deficits Pain;Decreased activity tolerance;Decreased range of motion   Rehab Potential Good   PT Frequency 3x / week   PT Duration 6 weeks   PT Treatment/Interventions ADLs/Self Care Home Management;Electrical Stimulation;Ultrasound;Therapeutic activities;Therapeutic exercise;Manual techniques   Consulted and Agree with Plan of Care Patient        Problem List Patient Active Problem List   Diagnosis Date Noted  . Hypertension 06/02/2013  . Hypothyroidism 11/08/2012  .  Hyperlipidemia 11/08/2012  . Osteopenia 11/08/2012    Quintan Saldivar,CHRIS, PTA 09/29/2014, 1:37 PM  [facility] [address] [place], [place], [address] Phone: [phone]   Fax:  [phone]

## 2014-09-29 NOTE — Progress Notes (Signed)
   Subjective:    Patient ID: Nicole Wheeler, female    DOB: May 26, 1930, 81 y.o.   MRN: [id number]  HPI Patient in today for Pap and breast exam- she was seen 2 weeks ago for follow up of chronic medical problems. SHe was doing well at that time and no med changes were made. She just got back from the beach and her only complaint today is left ankle and foot swelling. Denies any SOB.    Review of Systems  Constitutional: Negative.   HENT: Negative.   Respiratory: Negative.   Cardiovascular: Negative.   Gastrointestinal: Negative.   Genitourinary: Negative.   Neurological: Negative.   Psychiatric/Behavioral: Negative.   All other systems reviewed and are negative.      Objective:   Physical Exam  Constitutional: She is oriented to person, place, and time. She appears well-developed and well-nourished.  HENT:  Head: Normocephalic.  Right Ear: Hearing, tympanic membrane, external ear and ear canal normal.  Left Ear: Hearing, tympanic membrane, external ear and ear canal normal.  Nose: Nose normal.  Mouth/Throat: Uvula is midline and oropharynx is clear and moist.  Eyes: Conjunctivae and EOM are normal. Pupils are equal, round, and reactive to light.  Neck: Normal range of motion and full passive range of motion without pain. Neck supple. No JVD present. Carotid bruit is not present. No thyroid mass and no thyromegaly present.  Cardiovascular: Normal rate, normal heart sounds and intact distal pulses.   No murmur heard. Pulmonary/Chest: Effort normal and breath sounds normal. No respiratory distress. Right breast exhibits no inverted nipple, no mass, no nipple discharge, no skin change and no tenderness. Left breast exhibits no inverted nipple, no mass, no nipple discharge, no skin change and no tenderness.  Abdominal: Soft. Bowel sounds are normal. She exhibits no mass. There is no tenderness.  Genitourinary: Vagina normal and uterus normal. No breast swelling, tenderness, discharge  or bleeding.  bimanual exam-No adnexal masses or tenderness. Cervix stenosed- no vaginal discharge   Musculoskeletal: Normal range of motion. She exhibits edema (left ankle and foot edema- nontender).  Lymphadenopathy:    She has no cervical adenopathy.  Neurological: She is alert and oriented to person, place, and time.  Skin: Skin is warm and dry.  Psychiatric: She has a normal mood and affect. Her behavior is normal. Judgment and thought content normal.    BP 129/65 mmHg  Pulse 85  Temp(Src) 97.5 F (36.4 C) (Oral)  Ht 5\' 2"  (1.575 m)  Wt 157 lb 12.8 oz (71.578 kg)  BMI 28.85 kg/m2       Assessment & Plan:  1. Encounter for routine gynecological examination Continue all meds - POCT UA - Microscopic Only - POCT urinalysis dipstick  Mary-Margaret Hassell Done, FNP

## 2014-09-29 NOTE — Addendum Note (Signed)
Addended by: Earlene Plater on: 09/29/2014 02:52 PM   Modules accepted: Miquel Dunn

## 2014-09-29 NOTE — Addendum Note (Signed)
Addended by: Chevis Pretty on: 09/29/2014 02:45 PM   Modules accepted: Orders

## 2014-10-01 ENCOUNTER — Encounter: Payer: Self-pay | Admitting: *Deleted

## 2014-10-01 ENCOUNTER — Ambulatory Visit: Payer: Medicare Other | Admitting: *Deleted

## 2014-10-01 DIAGNOSIS — M545 Low back pain, unspecified: Secondary | ICD-10-CM

## 2014-10-01 DIAGNOSIS — G8929 Other chronic pain: Secondary | ICD-10-CM

## 2014-10-01 DIAGNOSIS — M2560 Stiffness of unspecified joint, not elsewhere classified: Secondary | ICD-10-CM | POA: Diagnosis not present

## 2014-10-01 DIAGNOSIS — M5386 Other specified dorsopathies, lumbar region: Secondary | ICD-10-CM

## 2014-10-01 LAB — PAP IG (IMAGE GUIDED): PAP Smear Comment: 0

## 2014-10-01 NOTE — Therapy (Signed)
[facility] [place], [place], [address] Phone: [phone]   Fax:  [phone]  Physical Therapy Treatment  Patient Details  Name: Nicole Wheeler MRN: [id number] Date of Birth: 1930-09-02 Referring Provider:  Chevis Pretty, *  Encounter Date: 10/01/2014      PT End of Session - 10/01/14 1115    Visit Number 4   Number of Visits 12   Date for PT Re-Evaluation 10/29/14   PT Start Time 1031   PT Stop Time 1117   PT Time Calculation (min) 46 min      Past Medical History  Diagnosis Date  . Arthritis   . Hypothyroidism     dr don Laurance Flatten   pcp  . Hypertension   . Hyperlipidemia     Past Surgical History  Procedure Laterality Date  . Throidectomy    . Appendectomy    . Lumbar laminectomy/decompression microdiscectomy  12/07/2011    Procedure: LUMBAR LAMINECTOMY/DECOMPRESSION MICRODISCECTOMY 1 LEVEL;  Surgeon: Ophelia Charter, MD;  Location: [place] [facility];  Service: Neurosurgery;  Laterality: Right;  Right Lumbar Five-Sacral One Diskectomy    There were no vitals filed for this visit.  Visit Diagnosis:  Chronic low back pain  Decreased range of motion of lumbar spine      Subjective Assessment - 10/01/14 1040    Subjective Did better after last Rx with less pain.Legs feel weak   Limitations Sitting   How long can you sit comfortably? 20 minutes.   Patient Stated Goals Get out of pain.   Currently in Pain? Yes   Pain Score 2    Pain Location Back   Pain Orientation Right   Pain Descriptors / Indicators Aching;Sore   Pain Type Chronic pain   Pain Onset More than a month ago   Pain Frequency Intermittent   Aggravating Factors  sitting and walking   Pain Relieving Factors rest, PT Rx                         [facility] Adult PT Treatment/Exercise - 10/01/14 0001    Electrical Stimulation   Electrical Stimulation Location Right SIJ/Piriformis region   Electrical Stimulation Action  premod 80-150hz  x15 mins hooklying   Ultrasound   Ultrasound Location RT SIJ and ino piriformis   Ultrasound Parameters 1.5 w/cm2 x78mins in Lt sidelying   Ultrasound Goals Pain   Manual Therapy   Manual Therapy Soft tissue mobilization;Myofascial release;Passive ROM   Myofascial Release STW/ TPR to RT SIJ area and into glutes and piriformis all with Pt LT sidelying   Passive ROM AAROM for  RT hip ABD 3x10 in LT sidelying                  PT Short Term Goals - 10/01/14 1047    PT SHORT TERM GOAL #1   Title Ind with initial HEP.   Status On-going           PT Long Term Goals - 10/01/14 1047    PT LONG TERM GOAL #1   Title Patient sit 30 minutes with pain not > 3/10.   Time 4   Period Weeks   Status Achieved   PT LONG TERM GOAL #2   Title Patient drive 45 minutes with pain not > 3/10.   Time 4   Period Weeks   Status On-going   PT LONG TERM GOAL #3   Title Perform ADL's with pain not >  3/10.   Status On-going               Plan - 10/01/14 1106    Clinical Impression Statement Pt did good with Rx today and is having less pain in RT side LB and RT hip Tt. No referred pain i her groin area today. She was able to meet LTG for sitting today,but not others due to pain   Pt will benefit from skilled therapeutic intervention in order to improve on the following deficits Pain;Decreased activity tolerance;Decreased range of motion   Rehab Potential Good   PT Frequency 3x / week   PT Duration 6 weeks   PT Treatment/Interventions ADLs/Self Care Home Management;Electrical Stimulation;Ultrasound;Therapeutic activities;Therapeutic exercise;Manual techniques   PT Next Visit Plan Left SDLY position with pillow between knees:  Please perform Combo to right SIJ/gluteal region f/b STW/M.  MD referral states extension based lumbar exercises also. TRY Nustep    Consulted and Agree with Plan of Care Patient        Problem List Patient Active Problem List   Diagnosis  Date Noted  . Hypertension 06/02/2013  . Hypothyroidism 11/08/2012  . Hyperlipidemia 11/08/2012  . Osteopenia 11/08/2012    Nicole Wheeler,Nicole Wheeler, PTA 10/01/2014, 11:15 AM  [facility] [address] [place], [place], [address] Phone: [phone]   Fax:  [phone]

## 2014-10-02 ENCOUNTER — Encounter: Payer: PRIVATE HEALTH INSURANCE | Admitting: *Deleted

## 2014-10-02 ENCOUNTER — Encounter: Payer: Self-pay | Admitting: *Deleted

## 2014-10-06 ENCOUNTER — Ambulatory Visit: Payer: Medicare Other | Admitting: Physical Therapy

## 2014-10-06 ENCOUNTER — Encounter: Payer: Self-pay | Admitting: Physical Therapy

## 2014-10-06 DIAGNOSIS — M5386 Other specified dorsopathies, lumbar region: Secondary | ICD-10-CM

## 2014-10-06 DIAGNOSIS — G8929 Other chronic pain: Secondary | ICD-10-CM

## 2014-10-06 DIAGNOSIS — M545 Low back pain, unspecified: Secondary | ICD-10-CM

## 2014-10-06 DIAGNOSIS — M2560 Stiffness of unspecified joint, not elsewhere classified: Secondary | ICD-10-CM | POA: Diagnosis not present

## 2014-10-06 NOTE — Therapy (Signed)
[facility] [place], [place], [address] Phone: [phone]   Fax:  [phone]  Physical Therapy Treatment  Patient Details  Name: ANAIRA SEAY MRN: [id number] Date of Birth: 1931-01-10 Referring Provider:  Chevis Pretty, *  Encounter Date: 10/06/2014      PT End of Session - 10/06/14 0941    Visit Number 5   Number of Visits 12   Date for PT Re-Evaluation 10/29/14   PT Start Time 0859   PT Stop Time 0956   PT Time Calculation (min) 57 min   Activity Tolerance Patient tolerated treatment well   Behavior During Therapy [facility] for tasks assessed/performed      Past Medical History  Diagnosis Date  . Arthritis   . Hypothyroidism     dr don Laurance Flatten   pcp  . Hypertension   . Hyperlipidemia     Past Surgical History  Procedure Laterality Date  . Throidectomy    . Appendectomy    . Lumbar laminectomy/decompression microdiscectomy  12/07/2011    Procedure: LUMBAR LAMINECTOMY/DECOMPRESSION MICRODISCECTOMY 1 LEVEL;  Surgeon: Ophelia Charter, MD;  Location: [place] [facility];  Service: Neurosurgery;  Laterality: Right;  Right Lumbar Five-Sacral One Diskectomy    There were no vitals filed for this visit.  Visit Diagnosis:  Chronic low back pain  Decreased range of motion of lumbar spine      Subjective Assessment - 10/06/14 0905    Subjective Patient feels both LE are weak with some soreness in right more so than left   Limitations Sitting   How long can you sit comfortably? 20 minutes.   Patient Stated Goals Get out of pain.   Currently in Pain? Yes   Pain Score 3    Pain Location --  Buttock and hip   Pain Orientation Right   Pain Descriptors / Indicators Sore   Pain Type Chronic pain   Pain Onset More than a month ago   Pain Frequency Intermittent   Aggravating Factors  sitting and getting up and going to walking   Pain Relieving Factors rest and meds                         [facility]  Adult PT Treatment/Exercise - 10/06/14 0001    Lumbar Exercises: Supine   Ab Set 20 reps;3 seconds   Bent Knee Raise 3 seconds;20 reps   Bridge 10 reps   Straight Leg Raise 10 reps;3 seconds  x10 each LE   Knee/Hip Exercises: Aerobic   Nustep 12min with UE/LE   Electrical Stimulation   Electrical Stimulation Location Right SIJ/Piriformis region   Electrical Stimulation Action premod   Electrical Stimulation Parameters 80-150hz    Electrical Stimulation Goals Pain   Ultrasound   Ultrasound Location Rt SIJ   Ultrasound Parameters 1.5w/cn2/50%/63mhz x27min   Ultrasound Goals Pain                  PT Short Term Goals - 10/01/14 1047    PT SHORT TERM GOAL #1   Title Ind with initial HEP.   Status On-going           PT Long Term Goals - 10/01/14 1047    PT LONG TERM GOAL #1   Title Patient sit 30 minutes with pain not > 3/10.   Time 4   Period Weeks   Status Achieved   PT LONG TERM GOAL #2   Title Patient drive 45 minutes  with pain not > 3/10.   Time 4   Period Weeks   Status On-going   PT LONG TERM GOAL #3   Title Perform ADL's with pain not > 3/10.   Status On-going               Plan - 10/06/14 4076    Clinical Impression Statement Patient tolerated treatment well with no reports of pain or soreness increased. Educated patient on posture techniques and low level lumbar core strengthening to avoid pain or soreness. Patient reported that she will not do exercises at home. Encouraged patient to try to avoid painful activities and try to activate core as much as possible. Unable to meet goals due to pain/soreness limitations.   Pt will benefit from skilled therapeutic intervention in order to improve on the following deficits Pain;Decreased activity tolerance;Decreased range of motion   Rehab Potential Good   PT Frequency 3x / week   PT Duration 6 weeks   PT Treatment/Interventions ADLs/Self Care Home Management;Electrical  Stimulation;Ultrasound;Therapeutic activities;Therapeutic exercise;Manual techniques   PT Next Visit Plan cont per patient tolerance   Consulted and Agree with Plan of Care Patient        Problem List Patient Active Problem List   Diagnosis Date Noted  . Hypertension 06/02/2013  . Hypothyroidism 11/08/2012  . Hyperlipidemia 11/08/2012  . Osteopenia 11/08/2012    DUNFORD, CHRISTINA P, PTA 10/06/2014, 9:59 AM  [facility] [address] [place], [place], [address] Phone: [phone]   Fax:  [phone]

## 2014-10-08 ENCOUNTER — Ambulatory Visit: Payer: Medicare Other | Admitting: Physical Therapy

## 2014-10-08 ENCOUNTER — Encounter: Payer: Self-pay | Admitting: Physical Therapy

## 2014-10-08 DIAGNOSIS — M545 Low back pain, unspecified: Secondary | ICD-10-CM

## 2014-10-08 DIAGNOSIS — G8929 Other chronic pain: Secondary | ICD-10-CM

## 2014-10-08 DIAGNOSIS — M2560 Stiffness of unspecified joint, not elsewhere classified: Secondary | ICD-10-CM | POA: Diagnosis not present

## 2014-10-08 DIAGNOSIS — M5386 Other specified dorsopathies, lumbar region: Secondary | ICD-10-CM

## 2014-10-08 NOTE — Patient Instructions (Signed)
Pelvic Tilt: Posterior - Legs Bent (Supine)  Tighten stomach and flatten back by rolling pelvis down. Hold _10___ seconds. Relax. Repeat _10-30___ times per set. Do __2__ sets per session. Do _2___ sessions per day.   Bent Leg Lift (Hook-Lying)  Tighten stomach and slowly raise right leg _5___ inches from floor. Keep trunk rigid. Hold _3___ seconds. Repeat _10___ times per set. Do ___2-3_ sets per session. Do __2__ sessions per day.   Straight Leg Raise  Tighten stomach and slowly raise locked right leg __4__ inches from floor. Repeat __10-30__ times per set. Do __2__ sets per session. Do __2__ sessions per day.  Bridging  Slowly raise buttocks from floor, keeping stomach tight. Repeat _10___ times per set. Do __2__ sets per session. Do __2__ sessions per day.    

## 2014-10-08 NOTE — Therapy (Signed)
[facility] [place], [place], [address] Phone: [phone]   Fax:  [phone]  Physical Therapy Treatment  Patient Details  Name: Nicole Wheeler MRN: [id number] Date of Birth: 1931-02-25 Referring Provider:  Chevis Pretty, *  Encounter Date: 10/08/2014      PT End of Session - 10/08/14 0921    Visit Number 6   Number of Visits 12   Date for PT Re-Evaluation 10/29/14   PT Start Time 0901   PT Stop Time 0958   PT Time Calculation (min) 57 min   Activity Tolerance Patient tolerated treatment well   Behavior During Therapy [facility] for tasks assessed/performed      Past Medical History  Diagnosis Date  . Arthritis   . Hypothyroidism     dr don Laurance Flatten   pcp  . Hypertension   . Hyperlipidemia     Past Surgical History  Procedure Laterality Date  . Throidectomy    . Appendectomy    . Lumbar laminectomy/decompression microdiscectomy  12/07/2011    Procedure: LUMBAR LAMINECTOMY/DECOMPRESSION MICRODISCECTOMY 1 LEVEL;  Surgeon: Ophelia Charter, MD;  Location: [place] [facility];  Service: Neurosurgery;  Laterality: Right;  Right Lumbar Five-Sacral One Diskectomy    There were no vitals filed for this visit.  Visit Diagnosis:  Chronic low back pain  Decreased range of motion of lumbar spine      Subjective Assessment - 10/08/14 0913    Subjective Patient feels sore and both LE are weak with some soreness in right more so than left   Limitations Sitting   How long can you sit comfortably? 20 minutes.   Patient Stated Goals Get out of pain.   Currently in Pain? Yes   Pain Score 4    Pain Location --  buttock and hip   Pain Orientation Right   Pain Descriptors / Indicators Sore   Pain Type Chronic pain   Pain Onset More than a month ago   Pain Frequency Intermittent   Aggravating Factors  sitting and getting up from sitting   Pain Relieving Factors rest and meds                         [facility]  Adult PT Treatment/Exercise - 10/08/14 0001    Lumbar Exercises: Supine   Ab Set 5 reps;20 reps   Bent Knee Raise 3 seconds;20 reps   Bridge 10 reps   Straight Leg Raise 10 reps;3 seconds   Knee/Hip Exercises: Aerobic   Nustep 54mn with UE/LE focus on core activation and posture   Electrical Stimulation   Electrical Stimulation Location Right SIJ/Piriformis region   Electrical Stimulation Action premod   Electrical Stimulation Parameters 80-150HZ   Electrical Stimulation Goals Pain   Ultrasound   Ultrasound Location Right SIJ   Ultrasound Parameters 1.5w/cm2/50%/110m x8m39m  Ultrasound Goals Pain                PT Education - 10/08/14 0921    Education provided Yes   Education Details HEP core strength   Person(s) Educated Patient   Methods Explanation;Demonstration;Handout   Comprehension Verbalized understanding;Returned demonstration          PT Short Term Goals - 10/08/14 0927    PT SHORT TERM GOAL #1   Title Ind with initial HEP.   Status Achieved           PT Long Term Goals - 10/08/14 0920240 PT  LONG TERM GOAL #1   Title Patient sit 30 minutes with pain not > 3/10.   Time 4   Period Weeks   Status Achieved   PT LONG TERM GOAL #2   Title Patient drive 45 minutes with pain not > 3/10.   Time 4   Period Weeks   Status On-going  up to 8-10/10   PT LONG TERM GOAL #3   Title Perform ADL's with pain not > 3/10.   Time 4   Period Weeks   Status On-going  3-4/10               Plan - 10/08/14 0929    Clinical Impression Statement Patient progressing slowly with goals due to soreness in right buttock and symptoms in legs. Patient understands importance of HEP and posture today. Patient has had relief and less symptoms yet still frustrated with the cause of pain and soreness. Patient going to Dr. Wynelle Link next week to discuss further options. Dr. Arnoldo Morale appt is not for a few weeks per patient. Met [facility] today others ongoing due to pain deficits.    Pt will benefit from skilled therapeutic intervention in order to improve on the following deficits Pain;Decreased activity tolerance;Decreased range of motion   Rehab Potential Good   PT Frequency 3x / week   PT Duration 6 weeks   PT Treatment/Interventions ADLs/Self Care Home Management;Electrical Stimulation;Ultrasound;Therapeutic activities;Therapeutic exercise;Manual techniques   PT Next Visit Plan cont per patient tolerance   Consulted and Agree with Plan of Care Patient        Problem List Patient Active Problem List   Diagnosis Date Noted  . Hypertension 06/02/2013  . Hypothyroidism 11/08/2012  . Hyperlipidemia 11/08/2012  . Osteopenia 11/08/2012    DUNFORD, CHRISTINA P, PTA 10/08/2014, 9:59 AM  [facility] [address] [place], [place], [address] Phone: [phone]   Fax:  [phone]

## 2014-10-13 DIAGNOSIS — M1611 Unilateral primary osteoarthritis, right hip: Secondary | ICD-10-CM | POA: Diagnosis not present

## 2014-10-16 ENCOUNTER — Ambulatory Visit: Payer: Medicare Other | Attending: Neurosurgery | Admitting: Physical Therapy

## 2014-10-16 DIAGNOSIS — G8929 Other chronic pain: Secondary | ICD-10-CM | POA: Insufficient documentation

## 2014-10-16 DIAGNOSIS — M2560 Stiffness of unspecified joint, not elsewhere classified: Secondary | ICD-10-CM | POA: Insufficient documentation

## 2014-10-16 DIAGNOSIS — M545 Low back pain: Secondary | ICD-10-CM | POA: Insufficient documentation

## 2014-10-19 ENCOUNTER — Encounter: Payer: Self-pay | Admitting: Physical Therapy

## 2014-10-19 ENCOUNTER — Ambulatory Visit: Payer: Medicare Other | Admitting: Physical Therapy

## 2014-10-19 DIAGNOSIS — M5386 Other specified dorsopathies, lumbar region: Secondary | ICD-10-CM

## 2014-10-19 DIAGNOSIS — G8929 Other chronic pain: Secondary | ICD-10-CM | POA: Diagnosis not present

## 2014-10-19 DIAGNOSIS — M545 Low back pain, unspecified: Secondary | ICD-10-CM

## 2014-10-19 DIAGNOSIS — M2560 Stiffness of unspecified joint, not elsewhere classified: Secondary | ICD-10-CM | POA: Diagnosis not present

## 2014-10-19 NOTE — Therapy (Signed)
[facility] [place], [place], [address] Phone: [phone]   Fax:  [phone]  Physical Therapy Treatment  Patient Details  Name: Nicole Wheeler MRN: [id number] Date of Birth: [birth year] Referring Provider:  Chevis Pretty, *  Encounter Date: 10/19/2014      PT End of Session - 10/19/14 1359    Visit Number 7   Number of Visits 12   Date for PT Re-Evaluation 10/29/14   PT Start Time 1347   PT Stop Time 1440   PT Time Calculation (min) 53 min   Activity Tolerance Patient tolerated treatment well   Behavior During Therapy [facility] for tasks assessed/performed      Past Medical History  Diagnosis Date  . Arthritis   . Hypothyroidism     dr don Laurance Flatten   pcp  . Hypertension   . Hyperlipidemia     Past Surgical History  Procedure Laterality Date  . Throidectomy    . Appendectomy    . Lumbar laminectomy/decompression microdiscectomy  12/07/2011    Procedure: LUMBAR LAMINECTOMY/DECOMPRESSION MICRODISCECTOMY 1 LEVEL;  Surgeon: Ophelia Charter, MD;  Location: [place] [facility];  Service: Neurosurgery;  Laterality: Right;  Right Lumbar Five-Sacral One Diskectomy    There were no vitals filed for this visit.  Visit Diagnosis:  Chronic low back pain  Decreased range of motion of lumbar spine      Subjective Assessment - 10/19/14 1354    Subjective Reports that she doesn't see improvement with the exercises and continues to report soreness. More comfortable with sitting or laying and with Tylenol or Tramadol. Is having an MRI tomorrow on the R hip. Reported during NuStep that she felt a pull on the R side of her neck around UT.   Limitations Sitting   How long can you sit comfortably? 20 minutes.   Patient Stated Goals Get out of pain.   Currently in Pain? Yes   Pain Score 4    Pain Location Buttocks   Pain Orientation Right   Pain Descriptors / Indicators Sore;Tender   Pain Type Chronic pain   Pain Onset More than  a month ago            [facility] PT Assessment - 10/19/14 0001    Assessment   Medical Diagnosis hronic low back pain.   Next MD Visit 10/2014                     [facility] Adult PT Treatment/Exercise - 10/19/14 0001    Lumbar Exercises: Aerobic   Stationary Bike NuStep L5 x8 min  Pain with sitting   Lumbar Exercises: Supine   Ab Set 20 reps;3 seconds   Bridge 15 reps;3 seconds   Other Supine Lumbar Exercises Ball to lap 2# x20 reps  Verbal cueing for core activation   Modalities   Modalities Electrical Stimulation;Ultrasound   Moist Heat Therapy   Number Minutes Moist Heat --   Moist Heat Location --   Acupuncturist Stimulation Location R SI Joint   Electrical Stimulation Action Pre-Mod   Electrical Stimulation Parameters 80-150 Hz x15 min   Electrical Stimulation Goals Pain   Ultrasound   Ultrasound Location R SI Joint   Ultrasound Parameters 1.5 w/cm2, 50%, 1 mhz in L sidelying with pillow between knees for comfort   Ultrasound Goals Pain                  PT Short Term Goals -  10/08/14 0927    PT SHORT TERM GOAL #1   Title Ind with initial HEP.   Status Achieved           PT Long Term Goals - 10/08/14 2778    PT LONG TERM GOAL #1   Title Patient sit 30 minutes with pain not > 3/10.   Time 4   Period Weeks   Status Achieved   PT LONG TERM GOAL #2   Title Patient drive 45 minutes with pain not > 3/10.   Time 4   Period Weeks   Status On-going  up to 8-10/10   PT LONG TERM GOAL #3   Title Perform ADL's with pain not > 3/10.   Time 4   Period Weeks   Status On-going  3-4/10               Plan - 10/19/14 1433    Clinical Impression Statement Patient tolerated treatment well today but reported pain with sitting in NuStep today. Required minimal verbal cueing and demonstration for correct technique and exercises. Normal modalities response noted following removal of the modalities. Goals remain on-going at  this time secondary to increased pain.  Experienced 4-5/10 soreness following treatment.    Pt will benefit from skilled therapeutic intervention in order to improve on the following deficits Pain;Decreased activity tolerance;Decreased range of motion   Rehab Potential Good   PT Frequency 3x / week   PT Duration 6 weeks   PT Treatment/Interventions ADLs/Self Care Home Management;Electrical Stimulation;Ultrasound;Therapeutic activities;Therapeutic exercise;Manual techniques   PT Next Visit Plan cont per patient tolerance        Problem List Patient Active Problem List   Diagnosis Date Noted  . Hypertension 06/02/2013  . Hypothyroidism 11/08/2012  . Hyperlipidemia 11/08/2012  . Osteopenia 11/08/2012    Wynelle Fanny, PTA 10/19/2014, 2:47 PM  [facility] [address] [place], [place], [address] Phone: [phone]   Fax:  [phone]

## 2014-10-20 DIAGNOSIS — M1611 Unilateral primary osteoarthritis, right hip: Secondary | ICD-10-CM | POA: Diagnosis not present

## 2014-10-26 ENCOUNTER — Encounter: Payer: Self-pay | Admitting: Physical Therapy

## 2014-10-26 ENCOUNTER — Ambulatory Visit: Payer: Medicare Other | Admitting: Physical Therapy

## 2014-10-26 DIAGNOSIS — M545 Low back pain, unspecified: Secondary | ICD-10-CM

## 2014-10-26 DIAGNOSIS — M5386 Other specified dorsopathies, lumbar region: Secondary | ICD-10-CM

## 2014-10-26 DIAGNOSIS — M2560 Stiffness of unspecified joint, not elsewhere classified: Secondary | ICD-10-CM | POA: Diagnosis not present

## 2014-10-26 DIAGNOSIS — G8929 Other chronic pain: Secondary | ICD-10-CM | POA: Diagnosis not present

## 2014-10-26 NOTE — Therapy (Signed)
[facility] [place], [place], [address] Phone: [phone]   Fax:  [phone]  Physical Therapy Treatment  Patient Details  Name: DEZARAI PREW MRN: [id number] Date of Birth: [birth year] Referring Provider:  Chevis Pretty, *  Encounter Date: 10/26/2014      PT End of Session - 10/26/14 0821    Visit Number 8   Number of Visits 12   Date for PT Re-Evaluation 10/29/14   PT Start Time 0818   PT Stop Time 0907   PT Time Calculation (min) 49 min   Activity Tolerance Patient tolerated treatment well   Behavior During Therapy [facility] for tasks assessed/performed      Past Medical History  Diagnosis Date  . Arthritis   . Hypothyroidism     dr don Laurance Flatten   pcp  . Hypertension   . Hyperlipidemia     Past Surgical History  Procedure Laterality Date  . Throidectomy    . Appendectomy    . Lumbar laminectomy/decompression microdiscectomy  12/07/2011    Procedure: LUMBAR LAMINECTOMY/DECOMPRESSION MICRODISCECTOMY 1 LEVEL;  Surgeon: Ophelia Charter, MD;  Location: [place] [facility];  Service: Neurosurgery;  Laterality: Right;  Right Lumbar Five-Sacral One Diskectomy    There were no vitals filed for this visit.  Visit Diagnosis:  Chronic low back pain  Decreased range of motion of lumbar spine      Subjective Assessment - 10/26/14 0820    Subjective Reports that her back did okay at a wedding in [place] since previous treatment and feels okay this morning. Continues to report soreness across low back into both buttocks. Reports that while going to [place] she sat in backseat sitting on pillow 95% of time. Has not heard anything from MRI yet.   Limitations Sitting   How long can you sit comfortably? 20 minutes.   Patient Stated Goals Get out of pain.   Currently in Pain? No/denies            [facility] PT Assessment - 10/26/14 0001    Assessment   Medical Diagnosis hronic low back pain.   Next MD Visit  11/06/2014                     [facility] Adult PT Treatment/Exercise - 10/26/14 0001    Lumbar Exercises: Supine   Ab Set 20 reps;3 seconds   Bent Knee Raise 3 seconds;20 reps;Other (comment)  Reported rubbing when raising LLE in "tailbone"   Bridge 10 reps;Other (comment)  Reported increased difficulty and soreness   Straight Leg Raise 15 reps;3 seconds;Other (comment)  Bilateral; verbal cueing for core activation;    Other Supine Lumbar Exercises Ball to lap 2# x20 reps  Verbal cueing for core activation   Modalities   Modalities Electrical Stimulation;Ultrasound   Acupuncturist Location R SI Joint   Electrical Stimulation Action Pre-Mod   Electrical Stimulation Parameters 80-150 Hz x15 min   Electrical Stimulation Goals Pain   Ultrasound   Ultrasound Location R SI Joint   Ultrasound Parameters 1.5 w/cm2, 100%, 1 mhz   Ultrasound Goals Pain                  PT Short Term Goals - 10/08/14 8242    PT SHORT TERM GOAL #1   Title Ind with initial HEP.   Status Achieved           PT Long Term Goals - 10/08/14 3536  PT LONG TERM GOAL #1   Title Patient sit 30 minutes with pain not > 3/10.   Time 4   Period Weeks   Status Achieved   PT LONG TERM GOAL #2   Title Patient drive 45 minutes with pain not > 3/10.   Time 4   Period Weeks   Status On-going  up to 8-10/10   PT LONG TERM GOAL #3   Title Perform ADL's with pain not > 3/10.   Time 4   Period Weeks   Status On-going  3-4/10               Plan - 10/26/14 0853    Clinical Impression Statement Patient tolerated treatment fairly well today although she continues to report low back/buttocks soreness. Experienced rubbing sensation in "tailbone" during LLE raising for marching and increased difficulty with R SLR. Continues to require minimal multimodal cueing for proper technique and form of exercises. Normal modalities response noted following removal of  the modalities. Goals remain ong-going secondary to patient reports of increased pain.  Experienced no pain just soreness in the low back/buttocks.   Pt will benefit from skilled therapeutic intervention in order to improve on the following deficits Pain;Decreased activity tolerance;Decreased range of motion   Rehab Potential Good   PT Frequency 3x / week   PT Duration 6 weeks   PT Treatment/Interventions ADLs/Self Care Home Management;Electrical Stimulation;Ultrasound;Therapeutic activities;Therapeutic exercise;Manual techniques   PT Next Visit Plan cont per patient tolerance        Problem List Patient Active Problem List   Diagnosis Date Noted  . Hypertension 06/02/2013  . Hypothyroidism 11/08/2012  . Hyperlipidemia 11/08/2012  . Osteopenia 11/08/2012    Wynelle Fanny, PTA 10/26/2014, 9:12 AM  [facility] [address] [place], [place], [address] Phone: [phone]   Fax:  [phone]

## 2014-10-28 ENCOUNTER — Encounter: Payer: Self-pay | Admitting: Physical Therapy

## 2014-10-28 ENCOUNTER — Ambulatory Visit: Payer: Medicare Other | Admitting: Physical Therapy

## 2014-10-28 DIAGNOSIS — M545 Low back pain, unspecified: Secondary | ICD-10-CM

## 2014-10-28 DIAGNOSIS — G8929 Other chronic pain: Secondary | ICD-10-CM

## 2014-10-28 DIAGNOSIS — M2560 Stiffness of unspecified joint, not elsewhere classified: Secondary | ICD-10-CM | POA: Diagnosis not present

## 2014-10-28 DIAGNOSIS — M5386 Other specified dorsopathies, lumbar region: Secondary | ICD-10-CM

## 2014-10-28 NOTE — Therapy (Signed)
[facility] [place], [place], [address] Phone: [phone]   Fax:  [phone]  Physical Therapy Treatment  Patient Details  Name: Nicole Wheeler MRN: [id number] Date of Birth: 08-10-1930 Referring Provider:  Chevis Pretty, *  Encounter Date: 10/28/2014      PT End of Session - 10/28/14 0950    Visit Number 9   Number of Visits 12   Date for PT Re-Evaluation 10/29/14   PT Start Time 0950   PT Stop Time 1031   PT Time Calculation (min) 41 min      Past Medical History  Diagnosis Date  . Arthritis   . Hypothyroidism     dr don Laurance Flatten   pcp  . Hypertension   . Hyperlipidemia     Past Surgical History  Procedure Laterality Date  . Throidectomy    . Appendectomy    . Lumbar laminectomy/decompression microdiscectomy  12/07/2011    Procedure: LUMBAR LAMINECTOMY/DECOMPRESSION MICRODISCECTOMY 1 LEVEL;  Surgeon: Ophelia Charter, MD;  Location: [place] [facility];  Service: Neurosurgery;  Laterality: Right;  Right Lumbar Five-Sacral One Diskectomy    There were no vitals filed for this visit.  Visit Diagnosis:  Chronic low back pain  Decreased range of motion of lumbar spine      Subjective Assessment - 10/28/14 0948    Subjective Reports "tailbone" soreness. 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   Limitations Sitting   How long can you sit comfortably? 20 minutes.   Patient Stated Goals Get out of pain.   Currently in Pain? Yes   Pain Score 5    Pain Location Sacrum   Pain Orientation Right   Pain Descriptors / Indicators Sore   Pain Type Chronic pain   Pain Onset More than a month ago            [facility] PT Assessment - 10/28/14 0001    Assessment   Medical Diagnosis hronic low back pain.   Next MD Visit 11/06/2014                     [facility] Adult PT Treatment/Exercise - 10/28/14 0001    Lumbar Exercises: Stretches   Active Hamstring Stretch 3 reps;30 seconds;Other (comment)  BLE    Single Knee to Chest Stretch 3 reps;30 seconds  BLE   Piriformis Stretch 3 reps;30 seconds;Other (comment)  BLE   Piriformis Stretch Limitations Reported difficulty with both sides   Modalities   Modalities Ultrasound   Ultrasound   Ultrasound Location R Glute   Ultrasound Parameters 1.5 w/cm2, 100%, 1 mhz   Ultrasound Goals Pain   Manual Therapy   Manual Therapy Myofascial release   Myofascial Release MFR/ TPR to R Glute/ Piriformis to decrease pain and tightness                  PT Short Term Goals - 10/08/14 6222    PT SHORT TERM GOAL #1   Title Ind with initial HEP.   Status Achieved           PT Long Term Goals - 10/08/14 9798    PT LONG TERM GOAL #1   Title Patient sit 30 minutes with pain not > 3/10.   Time 4   Period Weeks   Status Achieved   PT LONG TERM GOAL #2   Title Patient drive 45 minutes with pain not > 3/10.   Time 4   Period Weeks   Status On-going  up to 8-10/10   PT LONG TERM GOAL #3   Title Perform ADL's with pain not > 3/10.   Time 4   Period Weeks   Status On-going  3-4/10               Plan - 10/28/14 1041    Clinical Impression Statement Patient tolerated treatment well today with stretches and manual therapy. Experienced soreness during stretches especially Piriformis stretch. Notable tightness in R Glute and Piriformis during manual therapy and was able to tolerate moderate pressure although she experienced soreness. Normal modalities response noted following removal of the modalities. Experienced "feeling better" following treatment.   Pt will benefit from skilled therapeutic intervention in order to improve on the following deficits Pain;Decreased activity tolerance;Decreased range of motion   Rehab Potential Good   PT Frequency 3x / week   PT Duration 6 weeks   PT Treatment/Interventions ADLs/Self Care Home Management;Electrical Stimulation;Ultrasound;Therapeutic activities;Therapeutic exercise;Manual  techniques;Moist Heat   PT Next Visit Plan cont per patient tolerance   Consulted and Agree with Plan of Care Patient        Problem List Patient Active Problem List   Diagnosis Date Noted  . Hypertension 06/02/2013  . Hypothyroidism 11/08/2012  . Hyperlipidemia 11/08/2012  . Osteopenia 11/08/2012    Wynelle Fanny, PTA 10/28/2014, 10:48 AM  [facility] [address] [place], [place], [address] Phone: [phone]   Fax:  [phone]

## 2014-10-30 ENCOUNTER — Ambulatory Visit: Payer: Medicare Other | Admitting: Physical Therapy

## 2014-10-30 ENCOUNTER — Encounter: Payer: Self-pay | Admitting: Physical Therapy

## 2014-10-30 DIAGNOSIS — G8929 Other chronic pain: Secondary | ICD-10-CM

## 2014-10-30 DIAGNOSIS — M545 Low back pain, unspecified: Secondary | ICD-10-CM

## 2014-10-30 DIAGNOSIS — M2560 Stiffness of unspecified joint, not elsewhere classified: Secondary | ICD-10-CM | POA: Diagnosis not present

## 2014-10-30 DIAGNOSIS — M5386 Other specified dorsopathies, lumbar region: Secondary | ICD-10-CM

## 2014-10-30 NOTE — Therapy (Signed)
[facility] [place], [place], [address] Phone: [phone]   Fax:  [phone]  Physical Therapy Treatment  Patient Details  Name: Nicole Wheeler MRN: [id number] Date of Birth: 1930/11/23 Referring Provider:  Chevis Pretty, *  Encounter Date: 10/30/2014      PT End of Session - 10/30/14 0909    Visit Number 10   Number of Visits 12   Date for PT Re-Evaluation 10/29/14   PT Start Time 0859   PT Stop Time 0943   PT Time Calculation (min) 44 min   Activity Tolerance Patient tolerated treatment well   Behavior During Therapy [facility] for tasks assessed/performed      Past Medical History  Diagnosis Date  . Arthritis   . Hypothyroidism     dr don Laurance Flatten   pcp  . Hypertension   . Hyperlipidemia     Past Surgical History  Procedure Laterality Date  . Throidectomy    . Appendectomy    . Lumbar laminectomy/decompression microdiscectomy  12/07/2011    Procedure: LUMBAR LAMINECTOMY/DECOMPRESSION MICRODISCECTOMY 1 LEVEL;  Surgeon: Ophelia Charter, MD;  Location: [facility];  Service: Neurosurgery;  Laterality: Right;  Right Lumbar Five-Sacral One Diskectomy    There were no vitals filed for this visit.  Visit Diagnosis:  Chronic low back pain  Decreased range of motion of lumbar spine      Subjective Assessment - 10/30/14 0902    Subjective only has temporary relief from therapy   Limitations Sitting   How long can you sit comfortably? 20 minutes.   Patient Stated Goals Get out of pain.   Currently in Pain? Yes   Pain Score 5    Pain Location Sacrum   Pain Orientation Left;Right   Pain Descriptors / Indicators Sore   Pain Type Chronic pain   Pain Onset More than a month ago   Pain Frequency Intermittent   Aggravating Factors  sitting   Pain Relieving Factors rest                         [facility] Adult PT Treatment/Exercise - 10/30/14 0001    Modalities   Modalities Electrical  Stimulation;Moist Heat;Ultrasound   Moist Heat Therapy   Number Minutes Moist Heat 15 Minutes   Moist Heat Location Lumbar Spine   Electrical Stimulation   Electrical Stimulation Location R SI Joint   Electrical Stimulation Action premod   Electrical Stimulation Parameters 1-10hz    Electrical Stimulation Goals Pain   Ultrasound   Ultrasound Location bil low back paraspinals area of pain   Ultrasound Parameters 1.5w/cm2/50%/61mhz x24min   Ultrasound Goals Pain   Manual Therapy   Manual Therapy Myofascial release   Myofascial Release MFR/ TPR to R Glute/ Piriformis/paraspinals to decrease pain and tightness                  PT Short Term Goals - 10/08/14 9628    PT SHORT TERM GOAL #1   Title Ind with initial HEP.   Status Achieved           PT Long Term Goals - 10/30/14 3662    PT LONG TERM GOAL #1   Title Patient sit 30 minutes with pain not > 3/10.   Time 4   Period Weeks   Status Achieved   PT LONG TERM GOAL #2   Title Patient drive 45 minutes with pain not > 3/10.   Time 4  Period Weeks   Status On-going  5+/10 pain   PT LONG TERM GOAL #3   Title Perform ADL's with pain not > 3/10.   Time 4   Period Weeks   Status Achieved               Plan - 10/30/14 0910    Clinical Impression Statement Patient tolerated treatment well with temporary relief after each visit of therapy for about 6 hours then pain returns. Patient unable to sit long due to pain and does better with standing, ADL's or walking. Patient able to meet all goals except sitting driving for 45 min due to pain deficits.  FOTO 10th visit 43% limitation (initial 55%) Educated patient to continue core strength HEP and practice good posture techniques to strengthen back and avoid pain or re-injury as patient rported not doing exercises at home.   Pt will benefit from skilled therapeutic intervention in order to improve on the following deficits Pain;Decreased activity tolerance;Decreased  range of motion   Rehab Potential Good   PT Frequency 3x / week   PT Duration 6 weeks   PT Treatment/Interventions ADLs/Self Care Home Management;Electrical Stimulation;Ultrasound;Therapeutic activities;Therapeutic exercise;Manual techniques;Moist Heat   PT Next Visit Plan cont per patient tolerance   Consulted and Agree with Plan of Care Patient        Problem List Patient Active Problem List   Diagnosis Date Noted  . Hypertension 06/02/2013  . Hypothyroidism 11/08/2012  . Hyperlipidemia 11/08/2012  . Osteopenia 11/08/2012   Ladean Raya, PTA 10/30/2014 9:47 AM   Donicia Druck, Venetia Maxon, PTA 10/30/2014, 9:47 AM  [facility] [address] [place], [place], [address] Phone: [phone]   Fax:  [phone]

## 2014-11-03 ENCOUNTER — Encounter: Payer: Self-pay | Admitting: Physical Therapy

## 2014-11-03 ENCOUNTER — Ambulatory Visit: Payer: Medicare Other | Admitting: Physical Therapy

## 2014-11-03 DIAGNOSIS — M2560 Stiffness of unspecified joint, not elsewhere classified: Secondary | ICD-10-CM | POA: Diagnosis not present

## 2014-11-03 DIAGNOSIS — G8929 Other chronic pain: Secondary | ICD-10-CM

## 2014-11-03 DIAGNOSIS — M545 Low back pain, unspecified: Secondary | ICD-10-CM

## 2014-11-03 DIAGNOSIS — Z961 Presence of intraocular lens: Secondary | ICD-10-CM | POA: Diagnosis not present

## 2014-11-03 DIAGNOSIS — H2512 Age-related nuclear cataract, left eye: Secondary | ICD-10-CM | POA: Diagnosis not present

## 2014-11-03 DIAGNOSIS — M5386 Other specified dorsopathies, lumbar region: Secondary | ICD-10-CM

## 2014-11-03 NOTE — Therapy (Signed)
[facility] [place], [place], [address] Phone: [phone]   Fax:  [phone]  Physical Therapy Treatment  Patient Details  Name: Nicole Wheeler MRN: [id number] Date of Birth: 14-May-1930 Referring Provider:  Chevis Pretty, *  Encounter Date: 11/03/2014      PT End of Session - 11/03/14 0911    Visit Number 11   Number of Visits 12   Date for PT Re-Evaluation 10/29/14   PT Start Time 0906   PT Stop Time 0947   PT Time Calculation (min) 41 min   Activity Tolerance Patient tolerated treatment well   Behavior During Therapy [facility] for tasks assessed/performed      Past Medical History  Diagnosis Date  . Arthritis   . Hypothyroidism     dr don Laurance Flatten   pcp  . Hypertension   . Hyperlipidemia     Past Surgical History  Procedure Laterality Date  . Throidectomy    . Appendectomy    . Lumbar laminectomy/decompression microdiscectomy  12/07/2011    Procedure: LUMBAR LAMINECTOMY/DECOMPRESSION MICRODISCECTOMY 1 LEVEL;  Surgeon: Ophelia Charter, MD;  Location: [facility];  Service: Neurosurgery;  Laterality: Right;  Right Lumbar Five-Sacral One Diskectomy    There were no vitals filed for this visit.  Visit Diagnosis:  Chronic low back pain  Decreased range of motion of lumbar spine      Subjective Assessment - 11/03/14 0910    Subjective Reports no pain today continues to have soreness around "tailbone."  Reports weakness and numbness in B lower extremities today. "I have different symptoms everyday but I think I have spinal stenosis. My sister had spinal stenosis."   Limitations Sitting   How long can you sit comfortably? 20 minutes.   Patient Stated Goals Get out of pain.   Currently in Pain? No/denies            [facility] PT Assessment - 11/03/14 0001    Assessment   Medical Diagnosis hronic low back pain.   Next MD Visit 11/06/2014                     [facility] Adult PT  Treatment/Exercise - 11/03/14 0001    Lumbar Exercises: Aerobic   Stationary Bike NuStep L5 x8 min   Lumbar Exercises: Supine   Ab Set 20 reps;3 seconds   Bent Knee Raise 20 reps;3 seconds;Other (comment)  Experienced cramp mid-exercise   Bridge 20 reps;4 seconds   Straight Leg Raise 20 reps;2 seconds  BLE   Other Supine Lumbar Exercises Supine adductor squeeze 3 sec hold x20 reps with core activation; felt "some" pain in anterior R groin   Other Supine Lumbar Exercises B D2 with ball and core activation x12 reps   Lumbar Exercises: Sidelying   Hip Abduction 20 reps;3 seconds;Other (comment)  BLE                  PT Short Term Goals - 10/08/14 6962    PT SHORT TERM GOAL #1   Title Ind with initial HEP.   Status Achieved           PT Long Term Goals - 10/30/14 9528    PT LONG TERM GOAL #1   Title Patient sit 30 minutes with pain not > 3/10.   Time 4   Period Weeks   Status Achieved   PT LONG TERM GOAL #2   Title Patient drive 45 minutes with pain not > 3/10.  Time 4   Period Weeks   Status On-going  5+/10 pain   PT LONG TERM GOAL #3   Title Perform ADL's with pain not > 3/10.   Time 4   Period Weeks   Status Achieved               Plan - 11/03/14 0951    Clinical Impression Statement Patient tolerated treatment well and did well today overall with exercises. Patient demonstrated interest in trying therapeutic exercises today to assist with LE weakness symptoms. Performed all exercises with slow and controlled movement and verbal cueing for core activation. Denied pain following treatment today.   Pt will benefit from skilled therapeutic intervention in order to improve on the following deficits Pain;Decreased activity tolerance;Decreased range of motion   Rehab Potential Good   PT Frequency 3x / week   PT Duration 6 weeks   PT Treatment/Interventions ADLs/Self Care Home Management;Electrical Stimulation;Ultrasound;Therapeutic  activities;Therapeutic exercise;Manual techniques;Moist Heat   PT Next Visit Plan cont per patient tolerance   Consulted and Agree with Plan of Care Patient        Problem List Patient Active Problem List   Diagnosis Date Noted  . Hypertension 06/02/2013  . Hypothyroidism 11/08/2012  . Hyperlipidemia 11/08/2012  . Osteopenia 11/08/2012    Ahmed Prima, PTA 11/03/2014 9:56 AM  [facility] [address] [place], [place], [address] Phone: [phone]   Fax:  [phone]

## 2014-11-05 ENCOUNTER — Ambulatory Visit: Payer: Medicare Other | Admitting: Physical Therapy

## 2014-11-05 DIAGNOSIS — M2560 Stiffness of unspecified joint, not elsewhere classified: Secondary | ICD-10-CM | POA: Diagnosis not present

## 2014-11-05 DIAGNOSIS — M5386 Other specified dorsopathies, lumbar region: Secondary | ICD-10-CM

## 2014-11-05 DIAGNOSIS — G8929 Other chronic pain: Secondary | ICD-10-CM

## 2014-11-05 DIAGNOSIS — M545 Low back pain, unspecified: Secondary | ICD-10-CM

## 2014-11-05 NOTE — Therapy (Signed)
[facility] [place], [place], [address] Phone: [phone]   Fax:  [phone]  Physical Therapy Treatment  Patient Details  Name: Nicole Wheeler MRN: [id number] Date of Birth: 1930/12/04 Referring Provider:  Chevis Pretty, *  Encounter Date: 11/05/2014      PT End of Session - 11/05/14 0820    Visit Number 12   Number of Visits 12   Date for PT Re-Evaluation 10/29/14   PT Start Time 0819   PT Stop Time 0900   PT Time Calculation (min) 41 min   Activity Tolerance Patient tolerated treatment well   Behavior During Therapy [facility] for tasks assessed/performed      Past Medical History  Diagnosis Date  . Arthritis   . Hypothyroidism     dr don Laurance Flatten   pcp  . Hypertension   . Hyperlipidemia     Past Surgical History  Procedure Laterality Date  . Throidectomy    . Appendectomy    . Lumbar laminectomy/decompression microdiscectomy  12/07/2011    Procedure: LUMBAR LAMINECTOMY/DECOMPRESSION MICRODISCECTOMY 1 LEVEL;  Surgeon: Ophelia Charter, MD;  Location: [facility];  Service: Neurosurgery;  Laterality: Right;  Right Lumbar Five-Sacral One Diskectomy    There were no vitals filed for this visit.  Visit Diagnosis:  Chronic low back pain  Decreased range of motion of lumbar spine      Subjective Assessment - 11/05/14 0820    Subjective Feels pretty good overall. Doesn't feel right from my butt down. Weakness is a good word." Patient states she has improved, but there is still something going on.    How long can you sit comfortably? Unable to sit without a cushion unless recumbant.   Currently in Pain? No/denies                         [facility] Adult PT Treatment/Exercise - 11/05/14 0001    Lumbar Exercises: Aerobic   Stationary Bike NuStep L5 x8 min   Lumbar Exercises: Supine   Ab Set 20 reps;3 seconds   Bent Knee Raise 20 reps;3 seconds;Other (comment)  Experienced cramp mid-exercise    Bridge 20 reps;4 seconds   Straight Leg Raise 20 reps;2 seconds  BLE   Other Supine Lumbar Exercises Supine adductor squeeze 3 sec hold x20 reps with core activation; felt "some" pain in anterior R groin   Other Supine Lumbar Exercises B D2 with ball and core activation x20 reps  2# ball   Lumbar Exercises: Sidelying   Hip Abduction 20 reps;3 seconds;Other (comment)  BLE                PT Education - 11/05/14 0856    Education provided Yes   Education Details Continue with HEP   Person(s) Educated Patient   Methods Explanation;Demonstration   Comprehension Verbalized understanding;Returned demonstration          PT Short Term Goals - 10/08/14 0927    PT SHORT TERM GOAL #1   Title Ind with initial HEP.   Status Achieved           PT Long Term Goals - 11/05/14 1027    PT LONG TERM GOAL #1   Title Patient sit 30 minutes with pain not > 3/10.  As long as seat is cushioned   Time 4   Period Weeks   Status Achieved   PT LONG TERM GOAL #2   Title Patient drive 45 minutes with  pain not > 3/10.  Able to drive 40 minutes with 5/10 pain   Time 4   Period Weeks   Status Partially Met   PT LONG TERM GOAL #3   Title Perform ADL's with pain not > 3/10.   Time 4   Period Weeks   Status Achieved               Plan - 11-08-14 0908    Clinical Impression Statement Patient has met 2 of 3 LTGs and has partially met another. She continues to have pain with sitting is she is not on a cushioned surface. She demonstrates good but not full core strength. She should continue to improve strength by doing HEP. Patient was discharged today to her HEP.   PT Next Visit Plan see D/C summary   Consulted and Agree with Plan of Care Patient          G-Codes - 11/08/14 0910    Functional Assessment Tool Used FOTO.   Mobility: Walking and Moving Around Goal Status [phone]) At least 40 percent but less than 60 percent impaired, limited or restricted   Mobility: Walking and  Moving Around Discharge Status [phone]) At least 40 percent but less than 60 percent impaired, limited or restricted  53%. last week 43%      Problem List Patient Active Problem List   Diagnosis Date Noted  . Hypertension 06/02/2013  . Hypothyroidism 11/08/2012  . Hyperlipidemia 11/08/2012  . Osteopenia 11/08/2012    Madelyn Flavors PT  Nov 08, 2014, 9:26 AM  [facility] [address] [place], [place], [address] Phone: [phone]   Fax:  [phone]  PHYSICAL THERAPY DISCHARGE SUMMARY  Visits from Start of Care: 12  Current functional level related to goals / functional outcomes: See above   Remaining deficits: See above   Education / Equipment: HEP  Plan: Patient agrees to discharge.  Patient goals were partially met. Patient is being discharged due to being pleased with the current functional level.  ?????        Madelyn Flavors, PT November 08, 2014 9:28 AM [facility] [address] [place], [place], [address] Phone: [phone]   Fax:  [phone]

## 2014-11-06 DIAGNOSIS — M1611 Unilateral primary osteoarthritis, right hip: Secondary | ICD-10-CM | POA: Diagnosis not present

## 2014-11-26 ENCOUNTER — Ambulatory Visit: Payer: Self-pay | Admitting: Orthopedic Surgery

## 2014-11-26 ENCOUNTER — Encounter: Payer: Self-pay | Admitting: Nurse Practitioner

## 2014-11-26 ENCOUNTER — Ambulatory Visit (INDEPENDENT_AMBULATORY_CARE_PROVIDER_SITE_OTHER): Payer: Medicare Other

## 2014-11-26 ENCOUNTER — Ambulatory Visit (INDEPENDENT_AMBULATORY_CARE_PROVIDER_SITE_OTHER): Payer: Medicare Other | Admitting: Nurse Practitioner

## 2014-11-26 ENCOUNTER — Telehealth: Payer: Self-pay | Admitting: Nurse Practitioner

## 2014-11-26 VITALS — BP 135/72 | HR 84 | Temp 97.1°F | Ht 62.0 in | Wt 154.0 lb

## 2014-11-26 DIAGNOSIS — R222 Localized swelling, mass and lump, trunk: Secondary | ICD-10-CM

## 2014-11-26 DIAGNOSIS — M25551 Pain in right hip: Secondary | ICD-10-CM

## 2014-11-26 MED ORDER — TRAMADOL HCL 50 MG PO TABS: 50.0000 mg | ORAL_TABLET | Freq: Three times a day (TID) | ORAL | Status: DC | PRN

## 2014-11-26 NOTE — Telephone Encounter (Signed)
ulram rx ready for pick up

## 2014-11-26 NOTE — Telephone Encounter (Signed)
Rx up front. Patient notified

## 2014-11-26 NOTE — Progress Notes (Signed)
Preoperative surgical orders have been place into the Epic hospital system for Nicole Wheeler on 11/26/2014, 2:26 PM  by Mickel Crow for surgery on 12-29-2014.  Preop Hip orders including Experel Injecion, IV Tylenol, and IV Decadron as long as there are no contraindications to the above medications. [name], PA-C

## 2014-11-26 NOTE — Progress Notes (Signed)
   Subjective:    Patient ID: Nicole Wheeler, female    DOB: Oct 05, 1930, 81 y.o.   MRN: [id number]  HPI Patient walked in today with c/o nodule in anterior chest wall- noticed 10 day s ago- has gotten some bigger since then- pain radiates to shoulder and arm.    Review of Systems  Constitutional: Negative.   HENT: Negative.   Respiratory: Negative.  Negative for shortness of breath.   Cardiovascular: Negative.   Gastrointestinal: Negative.   Genitourinary: Negative.   Neurological: Negative.   Psychiatric/Behavioral: Negative.   All other systems reviewed and are negative.      Objective:   Physical Exam  Constitutional: She appears well-developed and well-nourished.  Cardiovascular: Normal rate, regular rhythm and normal heart sounds.   Pulmonary/Chest: Effort normal and breath sounds normal.  Skin: Skin is warm.  4cm slightly hard  nontender nodule base of neck  Psychiatric: She has a normal mood and affect. Her behavior is normal. Judgment and thought content normal.    BP 135/72 mmHg  Pulse 84  Temp(Src) 97.1 F (36.2 C) (Oral)  Ht 5\' 2"  (1.575 m)  Wt 154 lb (69.854 kg)  BMI 28.16 kg/m2  Chest xray negative- Preliminary reading by Ronnald Collum, FNP  [facility]       Assessment & Plan:  1. Nodule of chest wall U/s ordered - DG Chest 2 View; Future  Mary-Margaret Hassell Done, FNP

## 2014-11-27 ENCOUNTER — Other Ambulatory Visit: Payer: Self-pay | Admitting: Nurse Practitioner

## 2014-11-27 ENCOUNTER — Ambulatory Visit (HOSPITAL_COMMUNITY)
Admission: RE | Admit: 2014-11-27 | Discharge: 2014-11-27 | Disposition: A | Discharge status: Home / Self Care | Payer: Medicare Other | Source: Ambulatory Visit | Attending: Nurse Practitioner | Admitting: Nurse Practitioner

## 2014-11-27 DIAGNOSIS — R222 Localized swelling, mass and lump, trunk: Secondary | ICD-10-CM

## 2014-11-27 DIAGNOSIS — R937 Abnormal findings on diagnostic imaging of other parts of musculoskeletal system: Secondary | ICD-10-CM | POA: Diagnosis not present

## 2014-11-27 DIAGNOSIS — E041 Nontoxic single thyroid nodule: Secondary | ICD-10-CM | POA: Diagnosis not present

## 2014-11-27 DIAGNOSIS — R221 Localized swelling, mass and lump, neck: Secondary | ICD-10-CM | POA: Diagnosis not present

## 2014-11-30 ENCOUNTER — Telehealth: Payer: Self-pay | Admitting: Nurse Practitioner

## 2014-12-08 DIAGNOSIS — M545 Low back pain: Secondary | ICD-10-CM | POA: Diagnosis not present

## 2014-12-08 DIAGNOSIS — I10 Essential (primary) hypertension: Secondary | ICD-10-CM | POA: Diagnosis not present

## 2014-12-08 DIAGNOSIS — Z6828 Body mass index (BMI) 28.0-28.9, adult: Secondary | ICD-10-CM | POA: Diagnosis not present

## 2014-12-09 DIAGNOSIS — M545 Low back pain: Secondary | ICD-10-CM | POA: Diagnosis not present

## 2014-12-11 DIAGNOSIS — M5127 Other intervertebral disc displacement, lumbosacral region: Secondary | ICD-10-CM | POA: Diagnosis not present

## 2014-12-11 DIAGNOSIS — M545 Low back pain: Secondary | ICD-10-CM | POA: Diagnosis not present

## 2014-12-16 ENCOUNTER — Other Ambulatory Visit: Payer: Self-pay | Admitting: Nurse Practitioner

## 2014-12-29 ENCOUNTER — Encounter (HOSPITAL_BASED_OUTPATIENT_CLINIC_OR_DEPARTMENT_OTHER): Payer: Self-pay

## 2014-12-29 ENCOUNTER — Ambulatory Visit (HOSPITAL_BASED_OUTPATIENT_CLINIC_OR_DEPARTMENT_OTHER): Admit: 2014-12-29 | Payer: PRIVATE HEALTH INSURANCE | Admitting: Orthopedic Surgery

## 2014-12-29 SURGERY — RELEASE, BURSA, TROCHANTERIC
Anesthesia: Choice | Laterality: Right

## 2015-01-13 DIAGNOSIS — Z23 Encounter for immunization: Secondary | ICD-10-CM | POA: Diagnosis not present

## 2015-02-08 ENCOUNTER — Telehealth: Payer: Self-pay | Admitting: Nurse Practitioner

## 2015-02-09 NOTE — Telephone Encounter (Signed)
PT REPORTED °

## 2015-03-10 ENCOUNTER — Other Ambulatory Visit: Payer: Self-pay | Admitting: Nurse Practitioner

## 2015-03-11 ENCOUNTER — Other Ambulatory Visit: Payer: Self-pay

## 2015-03-11 DIAGNOSIS — Z1231 Encounter for screening mammogram for malignant neoplasm of breast: Secondary | ICD-10-CM

## 2015-03-29 ENCOUNTER — Ambulatory Visit (INDEPENDENT_AMBULATORY_CARE_PROVIDER_SITE_OTHER): Payer: Medicare Other

## 2015-03-29 ENCOUNTER — Encounter: Payer: Self-pay | Admitting: Family Medicine

## 2015-03-29 ENCOUNTER — Ambulatory Visit (INDEPENDENT_AMBULATORY_CARE_PROVIDER_SITE_OTHER): Payer: Medicare Other | Admitting: Family Medicine

## 2015-03-29 VITALS — BP 144/78 | HR 72 | Temp 97.3°F | Ht 62.0 in | Wt 158.0 lb

## 2015-03-29 DIAGNOSIS — M25552 Pain in left hip: Secondary | ICD-10-CM

## 2015-03-29 MED ORDER — BETAMETHASONE SOD PHOS & ACET 6 (3-3) MG/ML IJ SUSP
6.0000 mg | Freq: Once | INTRAMUSCULAR | Status: AC
  Administered 2015-03-29: 6 mg via INTRAMUSCULAR

## 2015-03-29 NOTE — Progress Notes (Signed)
Subjective:  Patient ID: Nicole Wheeler, female    DOB: 26-Sep-1930  Age: 82 y.o. MRN: [id number]  CC: Hip Pain   HPI Nicole Wheeler presents for increasing left hip pain for 1 week. Sudden onset. Interfering with sleep, ambulation. Stiffens when still. 7/10 pain. Nicole Wheeler has a past medical history of Arthritis; Hypothyroidism; Hypertension; and Hyperlipidemia.   She has past surgical history that includes throidectomy; Appendectomy; and Lumbar laminectomy/decompression microdiscectomy (12/07/2011).   Her family history is not on file.She reports that she has quit smoking. She does not have any smokeless tobacco history on file. She reports that she drinks alcohol. She reports that she does not use illicit drugs.  Outpatient Prescriptions Prior to Visit  Medication Sig Dispense Refill  . aspirin EC 81 MG tablet Take 81 mg by mouth daily.    Marland Kitchen CALCIUM PO Take 1 tablet by mouth every morning.     . Cholecalciferol 2000 UNITS TABS Take 2,000 Units by mouth daily.    . fish oil-omega-3 fatty acids 1000 MG capsule Take 1 capsule by mouth daily.    . hydrochlorothiazide (HYDRODIURIL) 25 MG tablet Take 1 tablet (25 mg total) by mouth daily. 90 tablet 1  . levothyroxine (SYNTHROID, LEVOTHROID) 100 MCG tablet Take 1 tablet (100 mcg total) by mouth daily. 90 tablet 2  . Multiple Vitamins-Minerals (ONE-A-DAY WOMENS 50 PLUS PO) Take 1 tablet by mouth every evening.    . raloxifene (EVISTA) 60 MG tablet TAKE 1 TABLET DAILY 90 tablet 0  . traMADol (ULTRAM) 50 MG tablet Take 1 tablet (50 mg total) by mouth every 8 (eight) hours as needed. 30 tablet 0   No facility-administered medications prior to visit.    ROS Review of Systems  Constitutional: Negative for fever, activity change and appetite change.  HENT: Negative for congestion, rhinorrhea and sore throat.   Eyes: Negative for visual disturbance.  Respiratory: Negative for cough and shortness of breath.   Cardiovascular: Negative  for chest pain and palpitations.  Gastrointestinal: Negative for nausea, abdominal pain and diarrhea.  Genitourinary: Negative for dysuria.  Musculoskeletal: Positive for arthralgias. Negative for myalgias.    Objective:  BP 144/78 mmHg  Pulse 72  Temp(Src) 97.3 F (36.3 C) (Oral)  Ht 5\' 2"  (1.575 m)  Wt 158 lb (71.668 kg)  BMI 28.89 kg/m2  BP Readings from Last 3 Encounters:  03/29/15 144/78  11/26/14 135/72  09/29/14 129/65    Wt Readings from Last 3 Encounters:  03/29/15 158 lb (71.668 kg)  11/26/14 154 lb (69.854 kg)  09/29/14 157 lb 12.8 oz (71.578 kg)     Physical Exam  Constitutional: She appears well-developed and well-nourished.  HENT:  Head: Normocephalic.  Cardiovascular: Normal rate and regular rhythm.   No murmur heard. Pulmonary/Chest: Effort normal and breath sounds normal.  Musculoskeletal: She exhibits tenderness.  Decreased range of motion at LLE for hip rotation. Tender to palpation at the greater trochanteric area      Lab Results  Component Value Date   WBC 9.0 06/15/2014   HGB 13.5 06/15/2014   HCT 40.6 06/15/2014   PLT 344 06/15/2014   GLUCOSE 96 09/23/2014   CHOL 202* 09/23/2014   TRIG 122 09/23/2014   HDL 50 09/23/2014   LDLCALC 128* 09/23/2014   ALT 16 09/23/2014   AST 18 09/23/2014   NA 140 09/23/2014   K 4.7 09/23/2014   CL 97 09/23/2014   CREATININE 1.05* 09/23/2014   BUN 32* 09/23/2014  CO2 24 09/23/2014   TSH 1.230 09/23/2014   INR 1.3 02/04/2007    US Soft Tissue Head/neck  11/27/2014  CLINICAL DATA:  82 year old female with a ten-day history of palpable nodule at the base of the neck accompanied by pain which radiates to the shoulder and right arm. EXAM: ULTRASOUND OF HEAD/NECK SOFT TISSUES TECHNIQUE: Ultrasound examination of the head and neck soft tissues was performed in the area of clinical concern. COMPARISON:  None. FINDINGS: The region of the palpable abnormality in the left supraclavicular space appears to  correlate with the sternocleidomastoid muscle. The muscle appears thickened and irregular with heterogeneous internal echotexture and some a mild fluid tracking between the muscle fibers. There is no discrete measurable mass. The region of the abnormality measures grossly 3.7 x 0.8 cm. IMPRESSION: Heterogeneous and irregular thickening of the muscle belly of the sternocleidomastoid with intramuscular edema. Findings are most suggestive of myositis, or strain/partial tear with intramuscular edema and mild hematoma. An infectious source is difficult to exclude completely although there is no discrete abscess collection. If symptoms persist, or there is evidence of enlargement consider further evaluation with MRI with and without gadolinium contrast. Electronically Signed   By: Nicole Wheeler M.D.   On: 11/27/2014 09:07    Assessment & Plan:   [name] was seen today for hip pain.  Diagnoses and all orders for this visit:  Hip pain, acute, left -     DG HIP UNILAT W OR W/O PELVIS 2-3 VIEWS LEFT; Future -     betamethasone acetate-betamethasone sodium phosphate (CELESTONE) injection 6 mg; Inject 1 mL (6 mg total) into the muscle once.   I have discontinued Nicole Wheeler's traMADol. I am also having her maintain her aspirin EC, Cholecalciferol, CALCIUM PO, fish oil-omega-3 fatty acids, Multiple Vitamins-Minerals (ONE-A-DAY WOMENS 50 PLUS PO), hydrochlorothiazide, levothyroxine, and raloxifene. We administered betamethasone acetate-betamethasone sodium phosphate.  Meds ordered this encounter  Medications  . betamethasone acetate-betamethasone sodium phosphate (CELESTONE) injection 6 mg    Sig:    X-ray shows evidence for arthritis no fracture. Use of NSAIDs discussed. Patient prefers not at this time.  Follow-up: Return if symptoms worsen or fail to improve.  Nicole Wheeler, M.D.

## 2015-04-06 ENCOUNTER — Ambulatory Visit
Admission: RE | Admit: 2015-04-06 | Discharge: 2015-04-06 | Disposition: A | Discharge status: Home / Self Care | Payer: Medicare Other | Source: Ambulatory Visit

## 2015-04-06 DIAGNOSIS — Z1231 Encounter for screening mammogram for malignant neoplasm of breast: Secondary | ICD-10-CM | POA: Diagnosis not present

## 2015-04-06 DIAGNOSIS — Z6828 Body mass index (BMI) 28.0-28.9, adult: Secondary | ICD-10-CM | POA: Diagnosis not present

## 2015-04-06 DIAGNOSIS — I10 Essential (primary) hypertension: Secondary | ICD-10-CM | POA: Diagnosis not present

## 2015-04-06 DIAGNOSIS — M545 Low back pain: Secondary | ICD-10-CM | POA: Diagnosis not present

## 2015-04-07 LAB — HM MAMMOGRAPHY

## 2015-04-09 ENCOUNTER — Telehealth: Payer: Self-pay | Admitting: Nurse Practitioner

## 2015-04-09 ENCOUNTER — Ambulatory Visit (INDEPENDENT_AMBULATORY_CARE_PROVIDER_SITE_OTHER): Payer: Medicare Other | Admitting: Nurse Practitioner

## 2015-04-09 VITALS — BP 145/66 | HR 81 | Temp 96.9°F | Ht 62.0 in | Wt 155.0 lb

## 2015-04-09 DIAGNOSIS — R600 Localized edema: Secondary | ICD-10-CM

## 2015-04-09 NOTE — Progress Notes (Addendum)
   Subjective:    Patient ID: Nicole Wheeler, female    DOB: 1931-01-11, 82 y.o.   MRN: [id number]  HPI Patient in today with c/o left leg swelling- occus off and on- saw Dr. Livia Snellen last Monday for same thing and he gave her a steroid shot which helped but has reoccurred.    Review of Systems  Constitutional: Negative.   HENT: Negative.   Respiratory: Negative.   Cardiovascular: Negative.   Genitourinary: Negative.   Neurological: Negative.   Psychiatric/Behavioral: Negative.   All other systems reviewed and are negative.      Objective:   Physical Exam  Constitutional: She is oriented to person, place, and time. She appears well-developed and well-nourished.  Cardiovascular: Normal rate, regular rhythm, normal heart sounds and intact distal pulses.   Pulmonary/Chest: Effort normal and breath sounds normal.  Musculoskeletal:  Calf edema on left and sore to touch Foot cool to touch  Neurological: She is alert and oriented to person, place, and time.  Skin: Skin is warm and dry.  Psychiatric: She has a normal mood and affect. Her behavior is normal. Judgment and thought content normal.    BP 145/66 mmHg  Pulse 81  Temp(Src) 96.9 F (36.1 C) (Oral)  Ht 5\' 2"  (1.575 m)  Wt 155 lb (70.308 kg)  BMI 28.34 kg/m2       Assessment & Plan:   1. Lower leg edema   rest' [facility] will schedule doppler in AM  Mary-Margaret Hassell Done, FNP

## 2015-04-09 NOTE — Patient Instructions (Signed)
Deep Vein Thrombosis °A deep vein thrombosis (DVT) is a blood clot (thrombus) that usually occurs in a deep, larger vein of the lower leg or the pelvis, or in an upper extremity such as the arm. These are dangerous and can lead to serious and even life-threatening complications if the clot travels to the lungs. °A DVT can damage the valves in your leg veins so that instead of flowing upward, the blood pools in the lower leg. This is called post-thrombotic syndrome, and it can result in pain, swelling, discoloration, and sores on the leg. °CAUSES °A DVT is caused by the formation of a blood clot in your leg, pelvis, or arm. Usually, several things contribute to the formation of blood clots. A clot may develop when: °· Your blood flow slows down. °· Your vein becomes damaged in some way. °· You have a condition that makes your blood clot more easily. °RISK FACTORS °A DVT is more likely to develop in: °· People who are older, especially over 60 years of age. °· People who are overweight (obese). °· People who sit or lie still for a long time, such as during long-distance travel (over 4 hours), bed rest, hospitalization, or during recovery from certain medical conditions like a stroke. °· People who do not engage in much physical activity (sedentary lifestyle). °· People who have chronic breathing disorders. °· People who have a personal or family history of blood clots or blood clotting disease. °· People who have peripheral vascular disease (PVD), diabetes, or some types of cancer. °· People who have heart disease, especially if the person had a recent heart attack or has congestive heart failure. °· People who have neurological diseases that affect the legs (leg paresis). °· People who have had a traumatic injury, such as breaking a hip or leg. °· People who have recently had major or lengthy surgery, especially on the hip, knee, or abdomen. °· People who have had a central line placed inside a large vein. °· People  who take medicines that contain the hormone estrogen. These include birth control pills and hormone replacement therapy. °· Pregnancy or during childbirth or the postpartum period. °· Long plane flights (over 8 hours). °SIGNS AND SYMPTOMS °Symptoms of a DVT can include:  °· Swelling of your leg or arm, especially if one side is much worse. °· Warmth and redness of your leg or arm, especially if one side is much worse. °· Pain in your arm or leg. If the clot is in your leg, symptoms may be more noticeable or worse when you stand or walk. °· A feeling of pins and needles, if the clot is in the arm. °The symptoms of a DVT that has traveled to the lungs (pulmonary embolism, PE) usually start suddenly and include: °· Shortness of breath while active or at rest. °· Coughing or coughing up blood or blood-tinged mucus. °· Chest pain that is often worse with deep breaths. °· Rapid or irregular heartbeat. °· Feeling light-headed or dizzy. °· Fainting. °· Feeling anxious. °· Sweating. °There may also be pain and swelling in a leg if that is where the blood clot started. °These symptoms may represent a serious problem that is an emergency. Do not wait to see if the symptoms will go away. Get medical help right away. Call your local emergency services (911 in the [place] Do not drive yourself to the hospital. °DIAGNOSIS °Your health care provider will take a medical history and perform a physical exam. You may also   have other tests, including: °· Blood tests to assess the clotting properties of your blood. °· Imaging tests, such as CT, ultrasound, MRI, X-ray, and other tests to see if you have clots anywhere in your body. °TREATMENT °After a DVT is identified, it can be treated. The type of treatment that you receive depends on many factors, such as the cause of your DVT, your risk for bleeding or developing more clots, and other medical conditions that you have. Sometimes, a combination of treatments is necessary. Treatment  options may be combined and include: °· Monitoring the blood clot with ultrasound. °· Taking medicines by mouth, such as newer blood thinners (anticoagulants), thrombolytics, or warfarin. °· Taking anticoagulant medicine by injection or through an IV tube. °· Wearing compression stockings or using different types of devices. °· Surgery (rare) to remove the blood clot or to place a filter in your abdomen to stop the blood clot from traveling to your lungs. °Treatments for a DVT are often divided into immediate treatment and long-term treatment (up to 3 months after DVT). You can work with your health care provider to choose the treatment program that is best for you. °HOME CARE INSTRUCTIONS °If you are taking a newer oral anticoagulant: °· Take the medicine every single day at the same time each day. °· Understand what foods and drugs interact with this medicine. °· Understand that there are no regular blood tests required when using this medicine. °· Understand the side effects of this medicine, including excessive bruising or bleeding. Ask your health care provider or pharmacist about other possible side effects. °If you are taking warfarin: °· Understand how to take warfarin and know which foods can affect how warfarin works in your body. °· Understand that it is dangerous to take too much or too little warfarin. Too much warfarin increases the risk of bleeding. Too little warfarin continues to allow the risk for blood clots. °· Follow your PT and INR blood testing schedule. The PT and INR results allow your health care provider to adjust your dose of warfarin. It is very important that you have your PT and INR tested as often as told by your health care provider. °· Avoid major changes in your diet, or tell your health care provider before you change your diet. Arrange a visit with a registered dietitian to answer your questions. Many foods, especially foods that are high in vitamin K, can interfere with warfarin  and affect the PT and INR results. Eat a consistent amount of foods that are high in vitamin K, such as: °¨ Spinach, kale, broccoli, cabbage, collard greens, turnip greens, Brussels sprouts, peas, cauliflower, seaweed, and parsley. °¨ Beef liver and pork liver. °¨ Green tea. °¨ Soybean oil. °· Tell your health care provider about any and all medicines, vitamins, and supplements that you take, including aspirin and other over-the-counter anti-inflammatory medicines. Be especially cautious with aspirin and anti-inflammatory medicines. Do not take those before you ask your health care provider if it is safe to do so. This is important because many medicines can interfere with warfarin and affect the PT and INR results. °· Do not start or stop taking any over-the-counter or prescription medicine unless your health care provider or pharmacist tells you to do so. °If you take warfarin, you will also need to do these things: °· Hold pressure over cuts for longer than usual. °· Tell your dentist and other health care providers that you are taking warfarin before you have any procedures in which   bleeding may occur. °· Avoid alcohol or drink very small amounts. Tell your health care provider if you change your alcohol intake. °· Do not use tobacco products, including cigarettes, chewing tobacco, and e-cigarettes. If you need help quitting, ask your health care provider. °· Avoid contact sports. °General Instructions °· Take over-the-counter and prescription medicines only as told by your health care provider. Anticoagulant medicines can have side effects, including easy bruising and difficulty stopping bleeding. If you are prescribed an anticoagulant, you will also need to do these things: °¨ Hold pressure over cuts for longer than usual. °¨ Tell your dentist and other health care providers that you are taking anticoagulants before you have any procedures in which bleeding may occur. °¨ Avoid contact sports. °· Wear a medical  alert bracelet or carry a medical alert card that says you have had a PE. °· Ask your health care provider how soon you can go back to your normal activities. Stay active to prevent new blood clots from forming. °· Make sure to exercise while traveling or when you have been sitting or standing for a long period of time. It is very important to exercise. Exercise your legs by walking or by tightening and relaxing your leg muscles often. Take frequent walks. °· Wear compression stockings as told by your health care provider to help prevent more blood clots from forming. °· Do not use tobacco products, including cigarettes, chewing tobacco, and e-cigarettes. If you need help quitting, ask your health care provider. °· Keep all follow-up appointments with your health care provider. This is important. °PREVENTION °Take these actions to decrease your risk of developing another DVT: °· Exercise regularly. For at least 30 minutes every day, engage in: °¨ Activity that involves moving your arms and legs. °¨ Activity that encourages good blood flow through your body by increasing your heart rate. °· Exercise your arms and legs every hour during long-distance travel (over 4 hours). Drink plenty of water and avoid drinking alcohol while traveling. °· Avoid sitting or lying in bed for long periods of time without moving your legs. °· Maintain a weight that is appropriate for your height. Ask your health care provider what weight is healthy for you. °· If you are a woman who is over 35 years of age, avoid unnecessary use of medicines that contain estrogen. These include birth control pills. °· Do not smoke, especially if you take estrogen medicines. If you need help quitting, ask your health care provider. °If you are hospitalized, prevention measures may include: °· Early walking after surgery, as soon as your health care provider says that it is safe. °· Receiving anticoagulants to prevent blood clots. If you cannot take  anticoagulants, other options may be available, such as wearing compression stockings or using different types of devices. °SEEK IMMEDIATE MEDICAL CARE IF: °· You have new or increased pain, swelling, or redness in an arm or leg. °· You have numbness or tingling in an arm or leg. °· You have shortness of breath while active or at rest. °· You have chest pain. °· You have a rapid or irregular heartbeat. °· You feel light-headed or dizzy. °· You cough up blood. °· You notice blood in your vomit, bowel movement, or urine. °These symptoms may represent a serious problem that is an emergency. Do not wait to see if the symptoms will go away. Get medical help right away. Call your local emergency services (911 in the [place] Do not drive yourself to the hospital. °  °  This information is not intended to replace advice given to you by your health care provider. Make sure you discuss any questions you have with your health care provider. °  °Document Released: 02/27/2005 Document Revised: 11/18/2014 Document Reviewed: 06/24/2014 °Elsevier Interactive Patient Education ©2016 [employer] ° °

## 2015-04-09 NOTE — Telephone Encounter (Signed)
Pt given appt with MMM in the after hours clinic at 5:45 this evening.

## 2015-04-10 ENCOUNTER — Other Ambulatory Visit: Payer: Self-pay | Admitting: Nurse Practitioner

## 2015-04-10 ENCOUNTER — Ambulatory Visit (HOSPITAL_COMMUNITY)
Admission: RE | Admit: 2015-04-10 | Discharge: 2015-04-10 | Disposition: A | Discharge status: Home / Self Care | Payer: Medicare Other | Source: Ambulatory Visit | Attending: Nurse Practitioner | Admitting: Nurse Practitioner

## 2015-04-10 DIAGNOSIS — M79662 Pain in left lower leg: Secondary | ICD-10-CM | POA: Insufficient documentation

## 2015-04-10 DIAGNOSIS — R600 Localized edema: Secondary | ICD-10-CM | POA: Insufficient documentation

## 2015-04-10 DIAGNOSIS — R609 Edema, unspecified: Secondary | ICD-10-CM

## 2015-04-29 DIAGNOSIS — M545 Low back pain: Secondary | ICD-10-CM | POA: Diagnosis not present

## 2015-04-29 DIAGNOSIS — M7062 Trochanteric bursitis, left hip: Secondary | ICD-10-CM | POA: Diagnosis not present

## 2015-05-20 ENCOUNTER — Encounter: Payer: Self-pay | Admitting: Nurse Practitioner

## 2015-05-20 ENCOUNTER — Ambulatory Visit (INDEPENDENT_AMBULATORY_CARE_PROVIDER_SITE_OTHER): Payer: Medicare Other | Admitting: Nurse Practitioner

## 2015-05-20 VITALS — BP 142/88 | HR 68 | Temp 97.2°F | Ht 62.0 in | Wt 156.0 lb

## 2015-05-20 DIAGNOSIS — E039 Hypothyroidism, unspecified: Secondary | ICD-10-CM | POA: Diagnosis not present

## 2015-05-20 DIAGNOSIS — Z6828 Body mass index (BMI) 28.0-28.9, adult: Secondary | ICD-10-CM | POA: Diagnosis not present

## 2015-05-20 DIAGNOSIS — E038 Other specified hypothyroidism: Secondary | ICD-10-CM

## 2015-05-20 DIAGNOSIS — E785 Hyperlipidemia, unspecified: Secondary | ICD-10-CM | POA: Diagnosis not present

## 2015-05-20 DIAGNOSIS — M8580 Other specified disorders of bone density and structure, unspecified site: Secondary | ICD-10-CM

## 2015-05-20 DIAGNOSIS — I10 Essential (primary) hypertension: Secondary | ICD-10-CM

## 2015-05-20 MED ORDER — RALOXIFENE HCL 60 MG PO TABS: 60.0000 mg | ORAL_TABLET | Freq: Every day | ORAL | Status: DC

## 2015-05-20 MED ORDER — HYDROCHLOROTHIAZIDE 25 MG PO TABS
25.0000 mg | ORAL_TABLET | Freq: Every day | ORAL | Status: DC
Start: 2015-05-20 — End: 2015-11-24

## 2015-05-20 MED ORDER — LEVOTHYROXINE SODIUM 100 MCG PO TABS: 100.0000 ug | ORAL_TABLET | Freq: Every day | ORAL | Status: DC

## 2015-05-20 NOTE — Progress Notes (Signed)
Subjective:    Patient ID: Nicole Wheeler, female    DOB: [birth year], 82 y.o.   MRN: [id number]  Patient here today for follow up of chronic medical problems.  Outpatient Encounter Prescriptions as of 05/20/2015  Medication Sig  . acetaminophen (TYLENOL) 500 MG tablet Take 500 mg by mouth every 6 (six) hours as needed (Takes 2 tabs every morning).  Marland Kitchen aspirin EC 81 MG tablet Take 81 mg by mouth daily.  Marland Kitchen CALCIUM PO Take 1 tablet by mouth every morning.   . Cholecalciferol 2000 UNITS TABS Take 2,000 Units by mouth daily.  . fish oil-omega-3 fatty acids 1000 MG capsule Take 1 capsule by mouth daily.  . hydrochlorothiazide (HYDRODIURIL) 25 MG tablet Take 1 tablet (25 mg total) by mouth daily.  Marland Kitchen levothyroxine (SYNTHROID, LEVOTHROID) 100 MCG tablet Take 1 tablet (100 mcg total) by mouth daily.  . Multiple Vitamins-Minerals (ONE-A-DAY WOMENS 50 PLUS PO) Take 1 tablet by mouth every evening.  . naproxen sodium (ANAPROX) 220 MG tablet Take 220 mg by mouth 2 (two) times daily with a meal. Takes at night time daily  . raloxifene (EVISTA) 60 MG tablet TAKE 1 TABLET DAILY   No facility-administered encounter medications on file as of 05/20/2015.     Hypertension This is a chronic problem. The current episode started more than 1 year ago. The problem is controlled. Pertinent negatives include no blurred vision, chest pain, neck pain, orthopnea, palpitations or shortness of breath. Risk factors for coronary artery disease include post-menopausal state and dyslipidemia. Past treatments include diuretics. The current treatment provides significant improvement. There are no compliance problems.  Hypertensive end-organ damage includes a thyroid problem.  Hyperlipidemia This is a chronic problem. The current episode started more than 1 year ago. Recent lipid tests were reviewed and are normal. There are no known factors aggravating her hyperlipidemia. Pertinent negatives include no chest pain or shortness of  breath. Treatments tried: patient has stopped talking her crestor because of myalgia- was told to take QOD but she stopped it all together. Compliance problems include medication side effects.  Risk factors for coronary artery disease include post-menopausal, hypertension and dyslipidemia.  Thyroid Problem Presents for follow-up visit. Patient reports no constipation, diarrhea, heat intolerance, palpitations or tremors. The symptoms have been stable. Her past medical history is significant for hyperlipidemia.  OSTEOPENIA: She is taking Evista 6o mg PO, no side effect reported.  Hip pain Has been complaining for over 2 months- has seen specialist- Dr. Nelva Bush and Dr. Arnoldo Morale. SHe has had a shot in her groin area which has helped a little and recently started therapy and has had 2 sessions. Therapy from yesterday increaed her pain today. STill takes ultram if needed.     Review of Systems  Constitutional: Negative for fever, chills and appetite change.  HENT: Negative for sore throat and trouble swallowing.   Eyes: Negative for blurred vision.  Respiratory: Negative for shortness of breath. Cough: nonproductive.   Cardiovascular: Negative for chest pain, palpitations and orthopnea.  Gastrointestinal: Negative for diarrhea and constipation.  Endocrine: Negative for heat intolerance.  Musculoskeletal: Negative for neck pain.  Neurological: Negative for tremors.  All other systems reviewed and are negative.      Objective:   Physical Exam  Constitutional: She is oriented to person, place, and time. She appears well-developed and well-nourished.  HENT:  Head: Normocephalic and atraumatic.  Mouth/Throat: Oropharynx is clear and moist.  Eyes: EOM are normal. Pupils are equal, round, and reactive to light.  Neck: Trachea normal, normal range of motion and full passive range of motion without pain. Neck supple. No JVD present. Carotid bruit is not present. No thyromegaly present.   Cardiovascular: Normal rate, regular rhythm, normal heart sounds and intact distal pulses.  Exam reveals no gallop and no friction rub.   No murmur heard. Pulmonary/Chest: Effort normal and breath sounds normal.  Abdominal: Soft. Bowel sounds are normal. She exhibits no distension and no mass. There is no tenderness.  Musculoskeletal: Normal range of motion.  Lymphadenopathy:    She has no cervical adenopathy.  Neurological: She is alert and oriented to person, place, and time. She has normal reflexes.  Skin: Skin is warm and dry.  Psychiatric: She has a normal mood and affect. Her behavior is normal. Judgment and thought content normal.   BP 142/88 mmHg  Pulse 68  Temp(Src) 97.2 F (36.2 C) (Oral)  Ht 5' 2"  (1.575 m)  Wt 156 lb (70.761 kg)  BMI 28.53 kg/m2       Assessment & Plan:   1. Essential hypertension Do not add salt to diet - CMP14+EGFR - hydrochlorothiazide (HYDRODIURIL) 25 MG tablet; Take 1 tablet (25 mg total) by mouth daily.  Dispense: 90 tablet; Refill: 1  2. Hypothyroidism, unspecified hypothyroidism type - Thyroid Panel With TSH - levothyroxine (SYNTHROID, LEVOTHROID) 100 MCG tablet; Take 1 tablet (100 mcg total) by mouth daily.  Dispense: 90 tablet; Refill: 2  3. Osteopenia Weight bearing exercises - raloxifene (EVISTA) 60 MG tablet; Take 1 tablet (60 mg total) by mouth daily.  Dispense: 90 tablet; Refill: 0  4. Hyperlipidemia Low fat diet - Lipid panel  5. BMI 28.0-28.9,adult Discussed diet and exercise for person with BMI >25 Will recheck weight in 3-6 months     Labs pending Health maintenance reviewed Diet and exercise encouraged Continue all meds Follow up  In 3 month   [place], FNP

## 2015-05-20 NOTE — Patient Instructions (Signed)
Fall Prevention in the Home  Falls can cause injuries and can affect people from all age groups. There are many simple things that you can do to make your home safe and to help prevent falls. WHAT CAN I DO ON THE OUTSIDE OF MY HOME?  Regularly repair the edges of walkways and driveways and fix any cracks.  Remove high doorway thresholds.  Trim any shrubbery on the main path into your home.  Use bright outdoor lighting.  Clear walkways of debris and clutter, including tools and rocks.  Regularly check that handrails are securely fastened and in good repair. Both sides of any steps should have handrails.  Install guardrails along the edges of any raised decks or porches.  Have leaves, snow, and ice cleared regularly.  Use sand or salt on walkways during winter months.  In the garage, clean up any spills right away, including grease or oil spills. WHAT CAN I DO IN THE BATHROOM?  Use night lights.  Install grab bars by the toilet and in the tub and shower. Do not use towel bars as grab bars.  Use non-skid mats or decals on the floor of the tub or shower.  If you need to sit down while you are in the shower, use a plastic, non-slip stool..  Keep the floor dry. Immediately clean up any water that spills on the floor.  Remove soap buildup in the tub or shower on a regular basis.  Attach bath mats securely with double-sided non-slip rug tape.  Remove throw rugs and other tripping hazards from the floor. WHAT CAN I DO IN THE BEDROOM?  Use night lights.  Make sure that a bedside light is easy to reach.  Do not use oversized bedding that drapes onto the floor.  Have a firm chair that has side arms to use for getting dressed.  Remove throw rugs and other tripping hazards from the floor. WHAT CAN I DO IN THE KITCHEN?   Clean up any spills right away.  Avoid walking on wet floors.  Place frequently used items in easy-to-reach places.  If you need to reach for something  above you, use a sturdy step stool that has a grab bar.  Keep electrical cables out of the way.  Do not use floor polish or wax that makes floors slippery. If you have to use wax, make sure that it is non-skid floor wax.  Remove throw rugs and other tripping hazards from the floor. WHAT CAN I DO IN THE STAIRWAYS?  Do not leave any items on the stairs.  Make sure that there are handrails on both sides of the stairs. Fix handrails that are broken or loose. Make sure that handrails are as long as the stairways.  Check any carpeting to make sure that it is firmly attached to the stairs. Fix any carpet that is loose or worn.  Avoid having throw rugs at the top or bottom of stairways, or secure the rugs with carpet tape to prevent them from moving.  Make sure that you have a light switch at the top of the stairs and the bottom of the stairs. If you do not have them, have them installed. WHAT ARE SOME OTHER FALL PREVENTION TIPS?  Wear closed-toe shoes that fit well and support your feet. Wear shoes that have rubber soles or low heels.  When you use a stepladder, make sure that it is completely opened and that the sides are firmly locked. Have someone hold the ladder while you   are using it. Do not climb a closed stepladder.  Add color or contrast paint or tape to grab bars and handrails in your home. Place contrasting color strips on the first and last steps.  Use mobility aids as needed, such as canes, walkers, scooters, and crutches.  Turn on lights if it is dark. Replace any light bulbs that burn out.  Set up furniture so that there are clear paths. Keep the furniture in the same spot.  Fix any uneven floor surfaces.  Choose a carpet design that does not hide the edge of steps of a stairway.  Be aware of any and all pets.  Review your medicines with your healthcare provider. Some medicines can cause dizziness or changes in blood pressure, which increase your risk of falling. Talk  with your health care provider about other ways that you can decrease your risk of falls. This may include working with a physical therapist or trainer to improve your strength, balance, and endurance.   This information is not intended to replace advice given to you by your health care provider. Make sure you discuss any questions you have with your health care provider.   Document Released: 02/17/2002 Document Revised: 07/14/2014 Document Reviewed: 04/03/2014 Elsevier Interactive Patient Education 2016 [employer]  

## 2015-05-21 LAB — CMP14+EGFR
ALBUMIN: 4.1 g/dL (ref 3.5–4.7)
ALK PHOS: 97 IU/L (ref 39–117)
ALT: 14 IU/L (ref 0–32)
AST: 18 IU/L (ref 0–40)
Albumin/Globulin Ratio: 1.5 (ref 1.1–2.5)
BUN/Creatinine Ratio: 28 — ABNORMAL HIGH (ref 11–26)
BUN: 27 mg/dL (ref 8–27)
Bilirubin Total: <0.2 mg/dL (ref 0.0–1.2)
CALCIUM: 9.5 mg/dL (ref 8.7–10.3)
CHLORIDE: 99 mmol/L (ref 96–106)
CO2: 25 mmol/L (ref 18–29)
Creatinine, Ser: 0.97 mg/dL (ref 0.57–1.00)
GFR calc non Af Amer: 55 mL/min/{1.73_m2} — ABNORMAL LOW (ref >59)
GFR, EST AFRICAN AMERICAN: 63 mL/min/{1.73_m2} (ref >59)
GLOBULIN, TOTAL: 2.8 g/dL (ref 1.5–4.5)
Glucose: 81 mg/dL (ref 65–99)
POTASSIUM: 4.7 mmol/L (ref 3.5–5.2)
Sodium: 141 mmol/L (ref 134–144)
Total Protein: 6.9 g/dL (ref 6.0–8.5)

## 2015-05-21 LAB — LIPID PANEL
CHOLESTEROL TOTAL: 211 mg/dL — AB (ref 100–199)
Chol/HDL Ratio: 3.8 ratio units (ref 0.0–4.4)
HDL: 56 mg/dL (ref >39)
LDL Calculated: 132 mg/dL — ABNORMAL HIGH (ref 0–99)
TRIGLYCERIDES: 115 mg/dL (ref 0–149)
VLDL CHOLESTEROL CAL: 23 mg/dL (ref 5–40)

## 2015-05-21 LAB — THYROID PANEL WITH TSH
FREE THYROXINE INDEX: 3.5 (ref 1.2–4.9)
T3 Uptake Ratio: 35 % (ref 24–39)
T4, Total: 9.9 ug/dL (ref 4.5–12.0)
TSH: 0.597 u[IU]/mL (ref 0.450–4.500)

## 2015-07-02 DIAGNOSIS — M7062 Trochanteric bursitis, left hip: Secondary | ICD-10-CM | POA: Diagnosis not present

## 2015-07-02 DIAGNOSIS — M25512 Pain in left shoulder: Secondary | ICD-10-CM | POA: Diagnosis not present

## 2015-07-29 DIAGNOSIS — H538 Other visual disturbances: Secondary | ICD-10-CM | POA: Diagnosis not present

## 2015-07-29 DIAGNOSIS — H2512 Age-related nuclear cataract, left eye: Secondary | ICD-10-CM | POA: Diagnosis not present

## 2015-07-29 DIAGNOSIS — H353131 Nonexudative age-related macular degeneration, bilateral, early dry stage: Secondary | ICD-10-CM | POA: Diagnosis not present

## 2015-08-12 ENCOUNTER — Ambulatory Visit: Payer: Medicare Other | Admitting: Pharmacist

## 2015-08-17 ENCOUNTER — Encounter: Payer: Self-pay | Admitting: Pharmacist

## 2015-08-17 ENCOUNTER — Ambulatory Visit (INDEPENDENT_AMBULATORY_CARE_PROVIDER_SITE_OTHER): Payer: Medicare Other | Admitting: Pharmacist

## 2015-08-17 VITALS — BP 136/72 | HR 64 | Ht 62.0 in | Wt 160.0 lb

## 2015-08-17 DIAGNOSIS — Z0000 Encounter for general adult medical examination without abnormal findings: Secondary | ICD-10-CM

## 2015-08-17 DIAGNOSIS — M8580 Other specified disorders of bone density and structure, unspecified site: Secondary | ICD-10-CM

## 2015-08-17 NOTE — Progress Notes (Signed)
Patient ID: Nicole Wheeler, female   DOB: [birth year], 82 y.o.   MRN: [id number]    Subjective:   Nicole Wheeler is a 82 y.o. female who presents for an Initial Medicare Annual Wellness Visit. Mrs. Juenemann is widowed for about 8 years.  She is retired.  She stays active in the community and with her family.    Review of Systems  Review of Systems  Constitutional: Negative.   HENT: Negative.   Eyes: Negative.   Respiratory: Negative.   Cardiovascular: Negative.   Gastrointestinal: Negative.   Genitourinary: Negative.   Musculoskeletal: Positive for joint pain (related to arthritis and spinal stenosis).  Skin: Negative.   Neurological: Negative.   Endo/Heme/Allergies: Negative.   Psychiatric/Behavioral: Negative.      Current Medications (verified) Outpatient Encounter Prescriptions as of 08/17/2015  Medication Sig  . acetaminophen (TYLENOL) 500 MG tablet Take 500 mg by mouth every 6 (six) hours as needed (Takes 2 tabs every morning).  Marland Kitchen aspirin EC 81 MG tablet Take 81 mg by mouth daily.  Marland Kitchen CALCIUM PO Take 600 mg by mouth every morning.   . cholecalciferol (VITAMIN D) 1000 units tablet Take 1,000 Units by mouth daily.  . diphenhydramine-acetaminophen (TYLENOL PM) 25-500 MG TABS tablet Take 1 tablet by mouth at bedtime as needed.  . fish oil-omega-3 fatty acids 1000 MG capsule Take 1 capsule by mouth daily.  . hydrochlorothiazide (HYDRODIURIL) 25 MG tablet Take 1 tablet (25 mg total) by mouth daily.  Marland Kitchen levothyroxine (SYNTHROID, LEVOTHROID) 100 MCG tablet Take 1 tablet (100 mcg total) by mouth daily.  . Multiple Vitamins-Minerals (CENTRUM SILVER PO) Take by mouth.  . naproxen sodium (ANAPROX) 220 MG tablet Take 220 mg by mouth 2 (two) times daily with a meal. Takes at night time daily  . raloxifene (EVISTA) 60 MG tablet Take 1 tablet (60 mg total) by mouth daily.  . [DISCONTINUED] Cholecalciferol 2000 UNITS TABS Take 1,000 Units by mouth daily.   . [DISCONTINUED] Multiple  Vitamins-Minerals (ONE-A-DAY WOMENS 50 PLUS PO) Take 1 tablet by mouth every evening.   No facility-administered encounter medications on file as of 08/17/2015.    Allergies (verified) Crestor and Lipitor   History: Past Medical History  Diagnosis Date  . Arthritis   . Hypothyroidism     dr don Laurance Flatten   pcp  . Hypertension   . Hyperlipidemia   . Spinal stenosis   . Cataract   . Osteopenia   . Shingles    Past Surgical History  Procedure Laterality Date  . Throidectomy    . Appendectomy    . Lumbar laminectomy/decompression microdiscectomy  12/07/2011    Procedure: LUMBAR LAMINECTOMY/DECOMPRESSION MICRODISCECTOMY 1 LEVEL;  Surgeon: Ophelia Charter, MD;  Location: [facility];  Service: Neurosurgery;  Laterality: Right;  Right Lumbar Five-Sacral One Diskectomy  . Eye surgery Right     cataract removal  . Tubal ligation     Family History  Problem Relation Age of Onset  . Stroke Mother 53  . Hypertension Mother   . Stroke Father 74  . Hypertension Father   . Heart attack Sister 59    heart attack   Social History   Occupational History  . Not on file.   Social History Main Topics  . Smoking status: Former Smoker    Quit date: 03/13/1978  . Smokeless tobacco: Never Used  . Alcohol Use: Yes     Comment: occ  . Drug Use: No  . Sexual Activity: No  Do you feel safe at home?  Yes Are there smokers in your home (other than you)? No  Dietary issues and exercise activities: Current Exercise Habits: The patient does not participate in regular exercise at present  Current Dietary habits:  Tries to eat a variety of fruits and vegetables.    Objective:    Today's Vitals   08/17/15 1142  BP: 136/72  Pulse: 64  Height: 5\' 2"  (1.575 m)  Weight: 160 lb (72.576 kg)  PainSc: 2   PainLoc: Shoulder   Body mass index is 29.26 kg/(m^2).  Activities of Daily Living In your present state of health, do you have any difficulty performing the following activities:  08/17/2015  Hearing? N  Vision? N  Difficulty concentrating or making decisions? N  Walking or climbing stairs? N  Dressing or bathing? N  Doing errands, shopping? N  Preparing Food and eating ? N  Using the Toilet? N  In the past six months, have you accidently leaked urine? N  Do you have problems with loss of bowel control? N  Managing your Medications? N  Managing your Finances? N  Housekeeping or managing your Housekeeping? N     Cardiac Risk Factors include: advanced age (>55men, >9 women);dyslipidemia;sedentary lifestyle  Depression Screen PHQ 2/9 Scores 08/17/2015 05/20/2015 09/29/2014 09/23/2014  PHQ - 2 Score 0 0 0 4  PHQ- 9 Score - - - 11     Fall Risk Fall Risk  08/17/2015 05/20/2015 09/29/2014 09/23/2014 06/15/2014  Falls in the past year? Yes No No No No  Number falls in past yr: 1 - - - -  Injury with Fall? No - - - -  Follow up Falls evaluation completed;Falls prevention discussed - - - -    Cognitive Function: MMSE - Mini Mental State Exam 08/17/2015 08/17/2015  Orientation to time 5 5  Orientation to Place 5 5  Registration 3 -  Attention/ Calculation 5 -  Recall 2 -  Language- name 2 objects 2 -  Language- repeat 1 -  Language- follow 3 step command 3 -  Language- read & follow direction 1 -  Write a sentence 1 -  Copy design 1 -  Total score 29 -    Immunizations and Health Maintenance Immunization History  Administered Date(s) Administered  . Influenza Split 12/08/2011, 12/14/2012  . Influenza, High Dose Seasonal PF 01/08/2014  . Influenza-Unspecified 01/13/2015  . Pneumococcal Conjugate-13 06/15/2014   Health Maintenance Due  Topic Date Due  . PNA vac Low Risk Adult (2 of 2 - PPSV23) 06/15/2015  . DEXA SCAN  07/17/2015    Patient Care Team: Chevis Pretty, FNP as PCP - General (Nurse Practitioner) Williams Che, MD as Consulting Physician (Ophthalmology) Newman Pies, MD as Consulting Physician (Neurosurgery)  Indicate any recent  Medical Services you may have received from other than [facility] providers in the past year (date may be approximate).    Assessment:    Annual Wellness Visit    Screening Tests Health Maintenance  Topic Date Due  . PNA vac Low Risk Adult (2 of 2 - PPSV23) 06/15/2015  . DEXA SCAN  07/17/2015  . ZOSTAVAX  11/20/2015 (Originally 11/28/1992)  . INFLUENZA VACCINE  10/12/2015  . MAMMOGRAM  04/05/2016  . TETANUS/TDAP  11/11/2020        Plan:   During the course of the visit Sandar was educated and counseled about the following appropriate screening and preventive services:   Vaccines to include Pneumoccal, Influenza, Td, Zostavax -  all UTD except she has a history of shingles and has not received Zostavax.  Cost is also an issue - checked today and was about $125  Colorectal cancer screening - due FOBT  Cardiovascular disease screening - EKG at next appt with PCP;   Diabetes screening - UTD; FBG was 81 (05/20/2015)  Bone Denisty / Osteoporosis Screening - due DEXA but our equipment is being upgraded - will schedule later this year  Mammogram - UTD  PAP - UTD  Glaucoma screening / Eye Exam - sees Dr Iona Hansen Q 6 months and is UTD  Nutrition counseling - discussed lean protein and whole grains.  Limit sugar intake.  Advanced Directives - UTD  Physical Activity  - given list of scheduled classes for senior center.  Patient to try to increase physical activity as able. To try balance classes or chair exercise classes first.    Patient Instructions (the written plan) were given to the patient.   Cherre Robins, [facility]   08/17/2015

## 2015-08-17 NOTE — Patient Instructions (Addendum)
Ms. Nicole Wheeler , Thank you for taking time to come for your Medicare Wellness Visit. I appreciate your ongoing commitment to your health goals. Please review the following plan we discussed and let me know if I can assist you in the future.   These are the goals we discussed: Look for copy of Alpha (important to know where these are kept - you can also bring copy to our office to be placed in our file / electronic chart) Try to increase physical activity - goal is 150 minutes weekly  Increase non-starchy vegetables - carrots, green bean, squash, zucchini, tomatoes, onions, peppers, spinach and other green leafy vegetables, cabbage, lettuce, cucumbers, asparagus, okra (not fried), eggplant Limit sugar and processed foods (cakes, cookies, ice cream, crackers and chips) Increase fresh fruit but limit serving sizes 1/2 cup or about the size of tennis or baseball Limit red meat to no more than 1-2 times per week (serving size about the size of your palm) Choose whole grains / lean proteins - whole wheat bread, quinoa, whole grain rice (1/2 cup), fish, chicken, [place] Avoid sugar and calorie containing beverages - soda, sweet tea and juice.  Choose water or unsweetened tea instead.    This is a list of the screening recommended for you and due dates:  Health Maintenance  Topic Date Due  . Pneumonia vaccines (2 of 2 - PPSV23) Completed  . Shingles Vaccine  Deferred due to cost and she has had shingles already  . Flu Shot  10/12/2015  . Mammogram  04/05/2016  . Tetanus Vaccine  11/11/2020  . DEXA scan (bone density measurement)  07/17/2015  *Topic was postponed. The date shown is not the original due date.    Fall Prevention in the Home  Falls can cause injuries and can affect people from all age groups. There are many simple things that you can do to make your home safe and to help prevent falls. WHAT CAN I DO ON THE OUTSIDE OF MY  HOME?  Regularly repair the edges of walkways and driveways and fix any cracks.  Remove high doorway thresholds.  Trim any shrubbery on the main path into your home.  Use bright outdoor lighting.  Clear walkways of debris and clutter, including tools and rocks.  Regularly check that handrails are securely fastened and in good repair. Both sides of any steps should have handrails.  Install guardrails along the edges of any raised decks or porches.  Have leaves, snow, and ice cleared regularly.  Use sand or salt on walkways during winter months.  In the garage, clean up any spills right away, including grease or oil spills. WHAT CAN I DO IN THE BATHROOM?  Use night lights.  Install grab bars by the toilet and in the tub and shower. Do not use towel bars as grab bars.  Use non-skid mats or decals on the floor of the tub or shower.  If you need to sit down while you are in the shower, use a plastic, non-slip stool.Marland Kitchen  Keep the floor dry. Immediately clean up any water that spills on the floor.  Remove soap buildup in the tub or shower on a regular basis.  Attach bath mats securely with double-sided non-slip rug tape.  Remove throw rugs and other tripping hazards from the floor. WHAT CAN I DO IN THE BEDROOM?  Use night lights.  Make sure that a bedside light is easy to reach.  Do not  use oversized bedding that drapes onto the floor.  Have a firm chair that has side arms to use for getting dressed.  Remove throw rugs and other tripping hazards from the floor. WHAT CAN I DO IN THE KITCHEN?   Clean up any spills right away.  Avoid walking on wet floors.  Place frequently used items in easy-to-reach places.  If you need to reach for something above you, use a sturdy step stool that has a grab bar.  Keep electrical cables out of the way.  Do not use floor polish or wax that makes floors slippery. If you have to use wax, make sure that it is non-skid floor  wax.  Remove throw rugs and other tripping hazards from the floor. WHAT CAN I DO IN THE STAIRWAYS?  Do not leave any items on the stairs.  Make sure that there are handrails on both sides of the stairs. Fix handrails that are broken or loose. Make sure that handrails are as long as the stairways.  Check any carpeting to make sure that it is firmly attached to the stairs. Fix any carpet that is loose or worn.  Avoid having throw rugs at the top or bottom of stairways, or secure the rugs with carpet tape to prevent them from moving.  Make sure that you have a light switch at the top of the stairs and the bottom of the stairs. If you do not have them, have them installed. WHAT ARE SOME OTHER FALL PREVENTION TIPS?  Wear closed-toe shoes that fit well and support your feet. Wear shoes that have rubber soles or low heels.  When you use a stepladder, make sure that it is completely opened and that the sides are firmly locked. Have someone hold the ladder while you are using it. Do not climb a closed stepladder.  Add color or contrast paint or tape to grab bars and handrails in your home. Place contrasting color strips on the first and last steps.  Use mobility aids as needed, such as canes, walkers, scooters, and crutches.  Turn on lights if it is dark. Replace any light bulbs that burn out.  Set up furniture so that there are clear paths. Keep the furniture in the same spot.  Fix any uneven floor surfaces.  Choose a carpet design that does not hide the edge of steps of a stairway.  Be aware of any and all pets.  Review your medicines with your healthcare provider. Some medicines can cause dizziness or changes in blood pressure, which increase your risk of falling. Talk with your health care provider about other ways that you can decrease your risk of falls. This may include working with a physical therapist or trainer to improve your strength, balance, and endurance.   This information  is not intended to replace advice given to you by your health care provider. Make sure you discuss any questions you have with your health care provider.   Document Released: 02/17/2002 Document Revised: 07/14/2014 Document Reviewed: 04/03/2014 Elsevier Interactive Patient Education [employer].

## 2015-08-23 DIAGNOSIS — H40003 Preglaucoma, unspecified, bilateral: Secondary | ICD-10-CM | POA: Diagnosis not present

## 2015-09-06 DIAGNOSIS — H40003 Preglaucoma, unspecified, bilateral: Secondary | ICD-10-CM | POA: Diagnosis not present

## 2015-09-16 ENCOUNTER — Other Ambulatory Visit: Payer: Self-pay | Admitting: Nurse Practitioner

## 2015-09-24 ENCOUNTER — Encounter: Payer: Self-pay | Admitting: Nurse Practitioner

## 2015-11-24 ENCOUNTER — Encounter: Payer: Self-pay | Admitting: Nurse Practitioner

## 2015-11-24 ENCOUNTER — Ambulatory Visit (INDEPENDENT_AMBULATORY_CARE_PROVIDER_SITE_OTHER): Payer: Medicare Other | Admitting: Nurse Practitioner

## 2015-11-24 ENCOUNTER — Other Ambulatory Visit: Payer: Medicare Other

## 2015-11-24 VITALS — BP 140/76 | HR 73 | Temp 97.6°F | Ht 62.0 in | Wt 161.0 lb

## 2015-11-24 DIAGNOSIS — I10 Essential (primary) hypertension: Secondary | ICD-10-CM | POA: Diagnosis not present

## 2015-11-24 DIAGNOSIS — N959 Unspecified menopausal and perimenopausal disorder: Secondary | ICD-10-CM

## 2015-11-24 DIAGNOSIS — E039 Hypothyroidism, unspecified: Secondary | ICD-10-CM | POA: Diagnosis not present

## 2015-11-24 DIAGNOSIS — E785 Hyperlipidemia, unspecified: Secondary | ICD-10-CM | POA: Diagnosis not present

## 2015-11-24 DIAGNOSIS — M8580 Other specified disorders of bone density and structure, unspecified site: Secondary | ICD-10-CM | POA: Diagnosis not present

## 2015-11-24 LAB — CMP14+EGFR
ALK PHOS: 97 IU/L (ref 39–117)
ALT: 12 IU/L (ref 0–32)
AST: 19 IU/L (ref 0–40)
Albumin/Globulin Ratio: 1.8 (ref 1.2–2.2)
Albumin: 4.2 g/dL (ref 3.5–4.7)
BUN/Creatinine Ratio: 20 (ref 12–28)
BUN: 22 mg/dL (ref 8–27)
Bilirubin Total: 0.3 mg/dL (ref 0.0–1.2)
CO2: 27 mmol/L (ref 18–29)
CREATININE: 1.09 mg/dL — AB (ref 0.57–1.00)
Calcium: 9.7 mg/dL (ref 8.7–10.3)
Chloride: 100 mmol/L (ref 96–106)
GFR calc Af Amer: 55 mL/min/{1.73_m2} — ABNORMAL LOW (ref >59)
GFR calc non Af Amer: 47 mL/min/{1.73_m2} — ABNORMAL LOW (ref >59)
GLOBULIN, TOTAL: 2.3 g/dL (ref 1.5–4.5)
Glucose: 103 mg/dL — ABNORMAL HIGH (ref 65–99)
POTASSIUM: 4.1 mmol/L (ref 3.5–5.2)
SODIUM: 142 mmol/L (ref 134–144)
Total Protein: 6.5 g/dL (ref 6.0–8.5)

## 2015-11-24 LAB — LIPID PANEL
CHOLESTEROL TOTAL: 188 mg/dL (ref 100–199)
Chol/HDL Ratio: 3.7 ratio units (ref 0.0–4.4)
HDL: 51 mg/dL (ref >39)
LDL CALC: 112 mg/dL — AB (ref 0–99)
TRIGLYCERIDES: 126 mg/dL (ref 0–149)
VLDL Cholesterol Cal: 25 mg/dL (ref 5–40)

## 2015-11-24 MED ORDER — RALOXIFENE HCL 60 MG PO TABS: 60.0000 mg | ORAL_TABLET | Freq: Every day | ORAL | 1 refills | Status: DC

## 2015-11-24 MED ORDER — HYDROCHLOROTHIAZIDE 25 MG PO TABS: 25.0000 mg | ORAL_TABLET | Freq: Every day | ORAL | 1 refills | Status: DC

## 2015-11-24 MED ORDER — LEVOTHYROXINE SODIUM 100 MCG PO TABS: 100.0000 ug | ORAL_TABLET | Freq: Every day | ORAL | 2 refills | Status: DC

## 2015-11-24 NOTE — Patient Instructions (Signed)
Bone Health Bones protect organs, store calcium, and anchor muscles. Good health habits, such as eating nutritious foods and exercising regularly, are important for maintaining healthy bones. They can also help to prevent a condition that causes bones to lose density and become weak and brittle (osteoporosis). WHY IS BONE MASS IMPORTANT? Bone mass refers to the amount of bone tissue that you have. The higher your bone mass, the stronger your bones. An important step toward having healthy bones throughout life is to have strong and dense bones during childhood. A young adult who has a high bone mass is more likely to have a high bone mass later in life. Bone mass at its greatest it is called peak bone mass. A large decline in bone mass occurs in older adults. In women, it occurs about the time of menopause. During this time, it is important to practice good health habits, because if more bone is lost than what is replaced, the bones will become less healthy and more likely to break (fracture). If you find that you have a low bone mass, you may be able to prevent osteoporosis or further bone loss by changing your diet and lifestyle. HOW CAN I FIND OUT IF MY BONE MASS IS LOW? Bone mass can be measured with an X-ray test that is called a bone mineral density (BMD) test. This test is recommended for all women who are age 65 or older. It may also be recommended for men who are age 70 or older, or for people who are more likely to develop osteoporosis due to:  Having bones that break easily.  Having a long-term disease that weakens bones, such as kidney disease or rheumatoid arthritis.  Having menopause earlier than normal.  Taking medicine that weakens bones, such as steroids, thyroid hormones, or hormone treatment for breast cancer or prostate cancer.  Smoking.  Drinking three or more alcoholic drinks each day. WHAT ARE THE NUTRITIONAL RECOMMENDATIONS FOR HEALTHY BONES? To have healthy bones, you need  to get enough of the right minerals and vitamins. Most nutrition experts recommend getting these nutrients from the foods that you eat. Nutritional recommendations vary from person to person. Ask your health care provider what is healthy for you. Here are some general guidelines. Calcium Recommendations Calcium is the most important (essential) mineral for bone health. Most people can get enough calcium from their diet, but supplements may be recommended for people who are at risk for osteoporosis. Good sources of calcium include:  Dairy products, such as low-fat or nonfat milk, cheese, and yogurt.  Dark green leafy vegetables, such as bok choy and broccoli.  Calcium-fortified foods, such as orange juice, cereal, bread, soy beverages, and tofu products.  Nuts, such as almonds. Follow these recommended amounts for daily calcium intake:  Children, age 1-3: 700 mg.  Children, age 4-8: 1,000 mg.  Children, age 9-13: 1,300 mg.  Teens, age 14-18: 1,300 mg.  Adults, age 19-50: 1,000 mg.  Adults, age 51-70:  Men: 1,000 mg.  Women: 1,200 mg.  Adults, age 71 or older: 1,200 mg.  Pregnant and breastfeeding females:  Teens: 1,300 mg.  Adults: 1,000 mg. Vitamin D Recommendations Vitamin D is the most essential vitamin for bone health. It helps the body to absorb calcium. Sunlight stimulates the skin to make vitamin D, so be sure to get enough sunlight. If you live in a cold climate or you do not get outside often, your health care provider may recommend that you take vitamin D supplements. Good   sources of vitamin D in your diet include:  Egg yolks.  Saltwater fish.  Milk and cereal fortified with vitamin D. Follow these recommended amounts for daily vitamin D intake:  Children and teens, age 1-18: 600 international units.  Adults, age 50 or younger: 400-800 international units.  Adults, age 51 or older: 800-1,000 international units. Other Nutrients Other nutrients for bone  health include:  Phosphorus. This mineral is found in meat, poultry, dairy foods, nuts, and legumes. The recommended daily intake for adult men and adult women is 700 mg.  Magnesium. This mineral is found in seeds, nuts, dark green vegetables, and legumes. The recommended daily intake for adult men is 400-420 mg. For adult women, it is 310-320 mg.  Vitamin K. This vitamin is found in green leafy vegetables. The recommended daily intake is 120 mg for adult men and 90 mg for adult women. WHAT TYPE OF PHYSICAL ACTIVITY IS BEST FOR BUILDING AND MAINTAINING HEALTHY BONES? Weight-bearing and strength-building activities are important for building and maintaining peak bone mass. Weight-bearing activities cause muscles and bones to work against gravity. Strength-building activities increases muscle strength that supports bones. Weight-bearing and muscle-building activities include:  Walking and hiking.  Jogging and running.  Dancing.  Gym exercises.  Lifting weights.  Tennis and racquetball.  Climbing stairs.  Aerobics. Adults should get at least 30 minutes of moderate physical activity on most days. Children should get at least 60 minutes of moderate physical activity on most days. Ask your health care provide what type of exercise is best for you. WHERE CAN I FIND MORE INFORMATION? For more information, check out the following websites:  National Osteoporosis Foundation: http://nof.org/learn/basics  National Institutes of Health: http://www.niams.nih.gov/Health_Info/Bone/Bone_Health/bone_health_for_life.asp   This information is not intended to replace advice given to you by your health care provider. Make sure you discuss any questions you have with your health care provider.   Document Released: 05/20/2003 Document Revised: 07/14/2014 Document Reviewed: 03/04/2014 Elsevier Interactive Patient Education 2016 Elsevier Inc[employer]  

## 2015-11-24 NOTE — Progress Notes (Signed)
Subjective:    Patient ID: Nicole Wheeler, female    DOB: 1930/03/27, 82 y.o.   MRN: [id number]  Patient here today for follow up of chronic medical problems.  Outpatient Encounter Prescriptions as of 11/24/2015  Medication Sig  . acetaminophen (TYLENOL) 500 MG tablet Take 500 mg by mouth daily.   Marland Kitchen aspirin EC 81 MG tablet Take 81 mg by mouth daily.  Marland Kitchen CALCIUM PO Take 600 mg by mouth every morning.   . cholecalciferol (VITAMIN D) 1000 units tablet Take 1,000 Units by mouth daily.  . diphenhydramine-acetaminophen (TYLENOL PM) 25-500 MG TABS tablet Take 1 tablet by mouth at bedtime as needed.  . fish oil-omega-3 fatty acids 1000 MG capsule Take 1 capsule by mouth daily.  . hydrochlorothiazide (HYDRODIURIL) 25 MG tablet Take 1 tablet (25 mg total) by mouth daily.  Marland Kitchen levothyroxine (SYNTHROID, LEVOTHROID) 100 MCG tablet Take 1 tablet (100 mcg total) by mouth daily.  . Multiple Vitamins-Minerals (CENTRUM SILVER PO) Take by mouth.  . naproxen sodium (ANAPROX) 220 MG tablet Take 220 mg by mouth daily. Takes at night time daily   . raloxifene (EVISTA) 60 MG tablet TAKE 1 TABLET DAILY   No facility-administered encounter medications on file as of 11/24/2015.      Hypertension  This is a chronic problem. The current episode started more than 1 year ago. The problem is controlled. Pertinent negatives include no blurred vision, chest pain, neck pain, orthopnea, palpitations or shortness of breath. Risk factors for coronary artery disease include post-menopausal state and dyslipidemia. Past treatments include diuretics. The current treatment provides significant improvement. There are no compliance problems.  Hypertensive end-organ damage includes a thyroid problem.  Hyperlipidemia  This is a chronic problem. The current episode started more than 1 year ago. Recent lipid tests were reviewed and are normal. There are no known factors aggravating her hyperlipidemia. Pertinent negatives include no chest pain  or shortness of breath. Treatments tried: patient has stopped talking her crestor because of myalgia- was told to take QOD but she stopped it all together. Compliance problems include medication side effects.  Risk factors for coronary artery disease include post-menopausal, hypertension and dyslipidemia.  Thyroid Problem  Presents for follow-up visit. Patient reports no constipation, diarrhea, heat intolerance, palpitations or tremors. The symptoms have been stable. Her past medical history is significant for hyperlipidemia.  OSTEOPENIA: She is taking Evista 6o mg PO, no side effect reported.  Hip pain Has been complaining for over 2 months- has seen specialist- Dr. Nelva Bush and Dr. Arnoldo Morale. SHe has had a shot in her groin area which has helped a little and recently started therapy and has had 2 sessions. Therapy from yesterday increaed her pain today. STill takes ultram if needed.     Review of Systems  Constitutional: Negative for appetite change, chills and fever.  HENT: Negative for sore throat and trouble swallowing.   Eyes: Negative for blurred vision.  Respiratory: Negative for shortness of breath. Cough: nonproductive.   Cardiovascular: Negative for chest pain, palpitations and orthopnea.  Gastrointestinal: Negative for constipation and diarrhea.  Endocrine: Negative for heat intolerance.  Musculoskeletal: Negative for neck pain.  Neurological: Negative for tremors.  All other systems reviewed and are negative.      Objective:   Physical Exam  Constitutional: She is oriented to person, place, and time. She appears well-developed and well-nourished.  HENT:  Head: Normocephalic and atraumatic.  Mouth/Throat: Oropharynx is clear and moist.  Eyes: EOM are normal. Pupils are equal, round,  and reactive to light.  Neck: Trachea normal, normal range of motion and full passive range of motion without pain. Neck supple. No JVD present. Carotid bruit is not present. No thyromegaly present.   Cardiovascular: Normal rate, regular rhythm, normal heart sounds and intact distal pulses.  Exam reveals no gallop and no friction rub.   No murmur heard. Pulmonary/Chest: Effort normal and breath sounds normal.  Abdominal: Soft. Bowel sounds are normal. She exhibits no distension and no mass. There is no tenderness.  Musculoskeletal: Normal range of motion.  Lymphadenopathy:    She has no cervical adenopathy.  Neurological: She is alert and oriented to person, place, and time. She has normal reflexes.  Skin: Skin is warm and dry.  Psychiatric: She has a normal mood and affect. Her behavior is normal. Judgment and thought content normal.   BP 140/76   Pulse 73   Temp 97.6 F (36.4 C) (Oral)   Ht 5' 2"  (1.575 m)   Wt 161 lb (73 kg)   BMI 29.45 kg/m        Assessment & Plan:   1. Essential hypertension Do not add salt to diet - CMP14+EGFR - hydrochlorothiazide (HYDRODIURIL) 25 MG tablet; Take 1 tablet (25 mg total) by mouth daily.  Dispense: 90 tablet; Refill: 1  2. Hypothyroidism, unspecified hypothyroidism type - levothyroxine (SYNTHROID, LEVOTHROID) 100 MCG tablet; Take 1 tablet (100 mcg total) by mouth daily.  Dispense: 90 tablet; Refill: 2  3. Osteopenia Weight bearing exercises - raloxifene (EVISTA) 60 MG tablet; Take 1 tablet (60 mg total) by mouth daily.  Dispense: 90 tablet; Refill: 1  4. Hyperlipidemia Low fta diet - Lipid panel  5. Menopausal and postmenopausal disorder - DG [facility] DEXA    Labs pending Health maintenance reviewed Diet and exercise encouraged Continue all meds Follow up  In 3 months   [place], FNP

## 2015-12-14 ENCOUNTER — Other Ambulatory Visit: Payer: Self-pay | Admitting: Physician Assistant

## 2015-12-14 DIAGNOSIS — D485 Neoplasm of uncertain behavior of skin: Secondary | ICD-10-CM | POA: Diagnosis not present

## 2015-12-14 DIAGNOSIS — L570 Actinic keratosis: Secondary | ICD-10-CM | POA: Diagnosis not present

## 2015-12-14 DIAGNOSIS — L821 Other seborrheic keratosis: Secondary | ICD-10-CM | POA: Diagnosis not present

## 2015-12-14 DIAGNOSIS — L820 Inflamed seborrheic keratosis: Secondary | ICD-10-CM | POA: Diagnosis not present

## 2015-12-18 DIAGNOSIS — Z23 Encounter for immunization: Secondary | ICD-10-CM | POA: Diagnosis not present

## 2015-12-23 ENCOUNTER — Encounter: Payer: Self-pay | Admitting: Family Medicine

## 2015-12-23 ENCOUNTER — Ambulatory Visit (INDEPENDENT_AMBULATORY_CARE_PROVIDER_SITE_OTHER): Payer: Medicare Other | Admitting: Family Medicine

## 2015-12-23 VITALS — BP 135/76 | HR 76 | Temp 97.2°F | Ht 62.0 in | Wt 159.4 lb

## 2015-12-23 DIAGNOSIS — J189 Pneumonia, unspecified organism: Secondary | ICD-10-CM | POA: Diagnosis not present

## 2015-12-23 MED ORDER — AZITHROMYCIN 250 MG PO TABS: ORAL_TABLET | ORAL | 0 refills | Status: DC

## 2015-12-23 NOTE — Patient Instructions (Signed)
Great to see you!  Start the antibiotics today   Community-Acquired Pneumonia, Adult Pneumonia is an infection of the lungs. There are different types of pneumonia. One type can develop while a person is in a hospital. A different type, called community-acquired pneumonia, develops in people who are not, or have not recently been, in the hospital or other health care facility.  CAUSES Pneumonia may be caused by bacteria, viruses, or funguses. Community-acquired pneumonia is often caused by Streptococcus pneumonia bacteria. These bacteria are often passed from one person to another by breathing in droplets from the cough or sneeze of an infected person. RISK FACTORS The condition is more likely to develop in:  People who havechronic diseases, such as chronic obstructive pulmonary disease (COPD), asthma, congestive heart failure, cystic fibrosis, diabetes, or kidney disease.  People who haveearly-stage or late-stage HIV.  People who havesickle cell disease.  People who havehad their spleen removed (splenectomy).  People who havepoor Human resources officer.  People who havemedical conditions that increase the risk of breathing in (aspirating) secretions their own mouth and nose.   People who havea weakened immune system (immunocompromised).  People who smoke.  People whotravel to areas where pneumonia-causing germs commonly exist.  People whoare around animal habitats or animals that have pneumonia-causing germs, including birds, bats, rabbits, cats, and farm animals. SYMPTOMS Symptoms of this condition include:  Adry cough.  A wet (productive) cough.  Fever.  Sweating.  Chest pain, especially when breathing deeply or coughing.  Rapid breathing or difficulty breathing.  Shortness of breath.  Shaking chills.  Fatigue.  Muscle aches. DIAGNOSIS Your health care provider will take a medical history and perform a physical exam. You may also have other tests,  including:  Imaging studies of your chest, including X-rays.  Tests to check your blood oxygen level and other blood gases.  Other tests on blood, mucus (sputum), fluid around your lungs (pleural fluid), and urine. If your pneumonia is severe, other tests may be done to identify the specific cause of your illness. TREATMENT The type of treatment that you receive depends on many factors, such as the cause of your pneumonia, the medicines you take, and other medical conditions that you have. For most adults, treatment and recovery from pneumonia may occur at home. In some cases, treatment must happen in a hospital. Treatment may include:  Antibiotic medicines, if the pneumonia was caused by bacteria.  Antiviral medicines, if the pneumonia was caused by a virus.  Medicines that are given by mouth or through an IV tube.  Oxygen.  Respiratory therapy. Although rare, treating severe pneumonia may include:  Mechanical ventilation. This is done if you are not breathing well on your own and you cannot maintain a safe blood oxygen level.  Thoracentesis. This procedureremoves fluid around one lung or both lungs to help you breathe better. HOME CARE INSTRUCTIONS  Take over-the-counter and prescription medicines only as told by your health care provider.  Only takecough medicine if you are losing sleep. Understand that cough medicine can prevent your body's natural ability to remove mucus from your lungs.  If you were prescribed an antibiotic medicine, take it as told by your health care provider. Do not stop taking the antibiotic even if you start to feel better.  Sleep in a semi-upright position at night. Try sleeping in a reclining chair, or place a few pillows under your head.  Do not use tobacco products, including cigarettes, chewing tobacco, and e-cigarettes. If you need help quitting, ask your  health care provider.  Drink enough water to keep your urine clear or pale yellow. This  will help to thin out mucus secretions in your lungs. PREVENTION There are ways that you can decrease your risk of developing community-acquired pneumonia. Consider getting a pneumococcal vaccine if:  You are older than 80 years of age.  You are older than 80 years of age and are undergoing cancer treatment, have chronic lung disease, or have other medical conditions that affect your immune system. Ask your health care provider if this applies to you. There are different types and schedules of pneumococcal vaccines. Ask your health care provider which vaccination option is best for you. You may also prevent community-acquired pneumonia if you take these actions:  Get an influenza vaccine every year. Ask your health care provider which type of influenza vaccine is best for you.  Go to the dentist on a regular basis.  Wash your hands often. Use hand sanitizer if soap and water are not available. SEEK MEDICAL CARE IF:  You have a fever.  You are losing sleep because you cannot control your cough with cough medicine. SEEK IMMEDIATE MEDICAL CARE IF:  You have worsening shortness of breath.  You have increased chest pain.  Your sickness becomes worse, especially if you are an older adult or have a weakened immune system.  You cough up blood.   This information is not intended to replace advice given to you by your health care provider. Make sure you discuss any questions you have with your health care provider.   Document Released: 02/27/2005 Document Revised: 11/18/2014 Document Reviewed: 06/24/2014 Elsevier Interactive Patient Education [employer].

## 2015-12-23 NOTE — Progress Notes (Signed)
   HPI  Patient presents today Here with cough.  Patient explains that she's had a nagging lingering cough for 2-3 weeks.It's nonproductive  She had a flu shot on Monday, 4 days ago, the following day she developed worsening cough, shortness of breath, nasal congestion, chest congestion with some productive cough, and diffuse chest tightness with deep inspiration.  She's tolerating food and fluids normally but had difficulty sleeping last night due to cough.  She also feels like her head is floating, she has malaise.  PMH: Smoking status noted ROS: Per HPI  Objective: BP 135/76   Pulse 76   Temp 97.2 F (36.2 C) (Oral)   Ht 5\' 2"  (1.575 m)   Wt 159 lb 6.4 oz (72.3 kg)   SpO2 95%   BMI 29.15 kg/m  Gen: NAD, alert, cooperative with exam HEENT: NCAT, Nares clear bilaterally, oropharynx clear, TMs normal bilaterally CV: RRR, good S1/S2, no murmur Resp: good air movement, nonlabored, rhonchorous expiratory sounds specifically the left lower lobe but also some scattered to a lesser degree throughout Ext: No edema, warm Neuro: Alert and oriented, No gross deficits  Assessment and plan:  # community-acquired pneumonia Given her symptoms and lung exam I think it's most prudent to go ahead and treat her for community acquired pneumonia. Azithromycin Discussed supportive care for cough Discussed very low threshold for return   Meds ordered this encounter  Medications  . azithromycin (ZITHROMAX) 250 MG tablet    Sig: Take 2 tablets on day 1 and 1 tablet daily after that    Dispense:  6 tablet    Refill:  0    Laroy Apple, MD [facility] 12/23/2015, 10:11 AM

## 2015-12-29 ENCOUNTER — Ambulatory Visit (INDEPENDENT_AMBULATORY_CARE_PROVIDER_SITE_OTHER): Payer: Medicare Other

## 2015-12-29 DIAGNOSIS — Z780 Asymptomatic menopausal state: Secondary | ICD-10-CM | POA: Diagnosis not present

## 2016-03-27 ENCOUNTER — Other Ambulatory Visit: Payer: Self-pay | Admitting: Family Medicine

## 2016-03-27 DIAGNOSIS — Z1231 Encounter for screening mammogram for malignant neoplasm of breast: Secondary | ICD-10-CM

## 2016-04-05 DIAGNOSIS — D3131 Benign neoplasm of right choroid: Secondary | ICD-10-CM | POA: Diagnosis not present

## 2016-04-05 DIAGNOSIS — H401132 Primary open-angle glaucoma, bilateral, moderate stage: Secondary | ICD-10-CM | POA: Diagnosis not present

## 2016-04-05 DIAGNOSIS — H353131 Nonexudative age-related macular degeneration, bilateral, early dry stage: Secondary | ICD-10-CM | POA: Diagnosis not present

## 2016-04-05 DIAGNOSIS — H2512 Age-related nuclear cataract, left eye: Secondary | ICD-10-CM | POA: Diagnosis not present

## 2016-04-05 DIAGNOSIS — Z961 Presence of intraocular lens: Secondary | ICD-10-CM | POA: Diagnosis not present

## 2016-04-05 DIAGNOSIS — D3132 Benign neoplasm of left choroid: Secondary | ICD-10-CM | POA: Diagnosis not present

## 2016-05-03 ENCOUNTER — Ambulatory Visit
Admission: RE | Admit: 2016-05-03 | Discharge: 2016-05-03 | Disposition: A | Discharge status: Home / Self Care | Payer: Medicare Other | Source: Ambulatory Visit | Attending: Family Medicine | Admitting: Family Medicine

## 2016-05-03 DIAGNOSIS — Z1231 Encounter for screening mammogram for malignant neoplasm of breast: Secondary | ICD-10-CM | POA: Diagnosis not present

## 2016-05-17 DIAGNOSIS — H401132 Primary open-angle glaucoma, bilateral, moderate stage: Secondary | ICD-10-CM | POA: Diagnosis not present

## 2016-05-17 DIAGNOSIS — H2512 Age-related nuclear cataract, left eye: Secondary | ICD-10-CM | POA: Diagnosis not present

## 2016-05-17 DIAGNOSIS — D3132 Benign neoplasm of left choroid: Secondary | ICD-10-CM | POA: Diagnosis not present

## 2016-05-17 DIAGNOSIS — Z961 Presence of intraocular lens: Secondary | ICD-10-CM | POA: Diagnosis not present

## 2016-06-16 ENCOUNTER — Other Ambulatory Visit: Payer: Self-pay | Admitting: Nurse Practitioner

## 2016-06-16 DIAGNOSIS — M8580 Other specified disorders of bone density and structure, unspecified site: Secondary | ICD-10-CM

## 2016-06-19 DIAGNOSIS — H409 Unspecified glaucoma: Secondary | ICD-10-CM | POA: Diagnosis not present

## 2016-06-19 DIAGNOSIS — H2512 Age-related nuclear cataract, left eye: Secondary | ICD-10-CM | POA: Diagnosis not present

## 2016-06-21 ENCOUNTER — Other Ambulatory Visit: Payer: Self-pay | Admitting: Nurse Practitioner

## 2016-06-21 DIAGNOSIS — I10 Essential (primary) hypertension: Secondary | ICD-10-CM

## 2016-07-26 ENCOUNTER — Encounter: Payer: Self-pay | Admitting: Pediatrics

## 2016-07-26 ENCOUNTER — Ambulatory Visit (INDEPENDENT_AMBULATORY_CARE_PROVIDER_SITE_OTHER): Payer: Medicare Other | Admitting: Pediatrics

## 2016-07-26 VITALS — BP 178/76 | HR 71 | Temp 97.4°F | Ht 62.0 in | Wt 164.6 lb

## 2016-07-26 DIAGNOSIS — I10 Essential (primary) hypertension: Secondary | ICD-10-CM | POA: Diagnosis not present

## 2016-07-26 DIAGNOSIS — G5601 Carpal tunnel syndrome, right upper limb: Secondary | ICD-10-CM

## 2016-07-26 DIAGNOSIS — M71331 Other bursal cyst, right wrist: Secondary | ICD-10-CM

## 2016-07-26 NOTE — Progress Notes (Signed)
  Subjective:   Patient ID: Nicole Wheeler, female    DOB: 02/25/1931, 83 y.o.   MRN: [id number] CC: Cyst (Right wrist, noticed today)  HPI: Nicole Wheeler is a 83 y.o. female presenting for Cyst (Right wrist, noticed today)  Has been doing a lot of vacuuming, housework Takes ASA 81mg  daily, no other blood thinners Had to get credit card out from between chair and console about a week ago, had some minor bruising on the back of her arm at that time Minimal wrist pain Full ROM  HTN: taking meds daily No CP, SOB, HA  Has numbness/tingling in R hand at night Has a wrist splint at home, when she wears it symptoms improved. hasnt worn for a while. Not interested in new splint.   No fevers, feeling well otherwise   Relevant past medical, surgical, family and social history reviewed. Allergies and medications reviewed and updated. History  Smoking Status  . Former Smoker  . Quit date: 03/13/1978  Smokeless Tobacco  . Never Used   ROS: Per HPI   Objective:    BP (!) 178/76   Pulse 71   Temp 97.4 F (36.3 C) (Oral)   Ht 5\' 2"  (1.575 m)   Wt 164 lb 9.6 oz (74.7 kg)   BMI 30.11 kg/m   Wt Readings from Last 3 Encounters:  07/26/16 164 lb 9.6 oz (74.7 kg)  12/23/15 159 lb 6.4 oz (72.3 kg)  11/24/15 161 lb (73 kg)    Gen: NAD, alert, cooperative with exam, NCAT EYES: EOMI, no conjunctival injection, or no icterus CV: NRRR, normal S1/S2, no murmur, distal pulses 2+ b/l Resp: CTABL, no wheezes, normal WOB Ext: No edema, warm Neuro: Alert and oriented MSK: apprx 1 cm by 0.5cm easily mobile slightly fluctuant nodule under the skin of dorsum R wrist Skin: slight bluish bruising 2cm x 3 cm around cystic structure as above on dorsum R wrist   Assessment & Plan:  Nicole Wheeler was seen today for cyst.  Diagnoses and all orders for this visit:  Other bursal cyst, right wrist Easily mobile, slight bruising Minimal pain/discomfort in wrist No swelling in hand/arm Rest, ice prn Should  improve over next couple of weeks  Carpal tunnel syndrome of right wrist Use wrist splint every night Better when using splint  Essential hypertension  elevated today Worried about stormy weather, wrist bruising Usually 130s SBP at home Asymptomatic Will have pt cont current med, has upcoming f/u appt with PCP Check at home If remains elevated let me know  Follow up plan: As scheduled with PCP Assunta Found, MD [place]

## 2016-08-04 ENCOUNTER — Encounter: Payer: Self-pay | Admitting: Nurse Practitioner

## 2016-08-04 ENCOUNTER — Ambulatory Visit (INDEPENDENT_AMBULATORY_CARE_PROVIDER_SITE_OTHER): Payer: Medicare Other | Admitting: Nurse Practitioner

## 2016-08-04 VITALS — BP 132/72 | HR 72 | Temp 97.9°F | Ht 62.0 in | Wt 162.8 lb

## 2016-08-04 DIAGNOSIS — M8580 Other specified disorders of bone density and structure, unspecified site: Secondary | ICD-10-CM | POA: Diagnosis not present

## 2016-08-04 DIAGNOSIS — E7800 Pure hypercholesterolemia, unspecified: Secondary | ICD-10-CM | POA: Diagnosis not present

## 2016-08-04 DIAGNOSIS — E039 Hypothyroidism, unspecified: Secondary | ICD-10-CM

## 2016-08-04 DIAGNOSIS — I10 Essential (primary) hypertension: Secondary | ICD-10-CM

## 2016-08-04 MED ORDER — HYDROCHLOROTHIAZIDE 25 MG PO TABS: 25.0000 mg | ORAL_TABLET | Freq: Every day | ORAL | 1 refills | Status: DC

## 2016-08-04 MED ORDER — LEVOTHYROXINE SODIUM 100 MCG PO TABS: 100.0000 ug | ORAL_TABLET | Freq: Every day | ORAL | 2 refills | Status: DC

## 2016-08-04 MED ORDER — RALOXIFENE HCL 60 MG PO TABS: 60.0000 mg | ORAL_TABLET | Freq: Every day | ORAL | 1 refills | Status: DC

## 2016-08-04 NOTE — Patient Instructions (Signed)
Bone Health Bones protect organs, store calcium, and anchor muscles. Good health habits, such as eating nutritious foods and exercising regularly, are important for maintaining healthy bones. They can also help to prevent a condition that causes bones to lose density and become weak and brittle (osteoporosis). Why is bone mass important? Bone mass refers to the amount of bone tissue that you have. The higher your bone mass, the stronger your bones. An important step toward having healthy bones throughout life is to have strong and dense bones during childhood. A young adult who has a high bone mass is more likely to have a high bone mass later in life. Bone mass at its greatest it is called peak bone mass. A large decline in bone mass occurs in older adults. In women, it occurs about the time of menopause. During this time, it is important to practice good health habits, because if more bone is lost than what is replaced, the bones will become less healthy and more likely to break (fracture). If you find that you have a low bone mass, you may be able to prevent osteoporosis or further bone loss by changing your diet and lifestyle. How can I find out if my bone mass is low? Bone mass can be measured with an X-ray test that is called a bone mineral density (BMD) test. This test is recommended for all women who are age 65 or older. It may also be recommended for men who are age 70 or older, or for people who are more likely to develop osteoporosis due to:  Having bones that break easily.  Having a long-term disease that weakens bones, such as kidney disease or rheumatoid arthritis.  Having menopause earlier than normal.  Taking medicine that weakens bones, such as steroids, thyroid hormones, or hormone treatment for breast cancer or prostate cancer.  Smoking.  Drinking three or more alcoholic drinks each day. What are the nutritional recommendations for healthy bones? To have healthy bones, you need  to get enough of the right minerals and vitamins. Most nutrition experts recommend getting these nutrients from the foods that you eat. Nutritional recommendations vary from person to person. Ask your health care provider what is healthy for you. Here are some general guidelines. Calcium Recommendations  Calcium is the most important (essential) mineral for bone health. Most people can get enough calcium from their diet, but supplements may be recommended for people who are at risk for osteoporosis. Good sources of calcium include:  Dairy products, such as low-fat or nonfat milk, cheese, and yogurt.  Dark green leafy vegetables, such as bok choy and broccoli.  Calcium-fortified foods, such as orange juice, cereal, bread, soy beverages, and tofu products.  Nuts, such as almonds. Follow these recommended amounts for daily calcium intake:  Children, age 1?3: 700 mg.  Children, age 4?8: 1,000 mg.  Children, age 9?13: 1,300 mg.  Teens, age 14?18: 1,300 mg.  Adults, age 19?50: 1,000 mg.  Adults, age 51?70:  Men: 1,000 mg.  Women: 1,200 mg.  Adults, age 71 or older: 1,200 mg.  Pregnant and breastfeeding females:  Teens: 1,300 mg.  Adults: 1,000 mg. Vitamin D Recommendations  Vitamin D is the most essential vitamin for bone health. It helps the body to absorb calcium. Sunlight stimulates the skin to make vitamin D, so be sure to get enough sunlight. If you live in a cold climate or you do not get outside often, your health care provider may recommend that you take vitamin D   supplements. Good sources of vitamin D in your diet include:  Egg yolks.  Saltwater fish.  Milk and cereal fortified with vitamin D. Follow these recommended amounts for daily vitamin D intake:  Children and teens, age 1?18: 600 international units.  Adults, age 50 or younger: 400-800 international units.  Adults, age 51 or older: 800-1,000 international units. Other Nutrients  Other nutrients for bone  health include:  Phosphorus. This mineral is found in meat, poultry, dairy foods, nuts, and legumes. The recommended daily intake for adult men and adult women is 700 mg.  Magnesium. This mineral is found in seeds, nuts, dark green vegetables, and legumes. The recommended daily intake for adult men is 400?420 mg. For adult women, it is 310?320 mg.  Vitamin K. This vitamin is found in green leafy vegetables. The recommended daily intake is 120 mg for adult men and 90 mg for adult women. What type of physical activity is best for building and maintaining healthy bones? Weight-bearing and strength-building activities are important for building and maintaining peak bone mass. Weight-bearing activities cause muscles and bones to work against gravity. Strength-building activities increases muscle strength that supports bones. Weight-bearing and muscle-building activities include:  Walking and hiking.  Jogging and running.  Dancing.  Gym exercises.  Lifting weights.  Tennis and racquetball.  Climbing stairs.  Aerobics. Adults should get at least 30 minutes of moderate physical activity on most days. Children should get at least 60 minutes of moderate physical activity on most days. Ask your health care provide what type of exercise is best for you. Where can I find more information? For more information, check out the following websites:  National Osteoporosis Foundation: http://nof.org/learn/basics  National Institutes of Health: http://www.niams.nih.gov/Health_Info/Bone/Bone_Health/bone_health_for_life.asp This information is not intended to replace advice given to you by your health care provider. Make sure you discuss any questions you have with your health care provider. Document Released: 05/20/2003 Document Revised: 09/17/2015 Document Reviewed: 03/04/2014 Elsevier Interactive Patient Education  2017 [employer]  

## 2016-08-04 NOTE — Progress Notes (Signed)
 Subjective:    Patient ID: Nicole Wheeler, female    DOB: 03/18/1930, 83 y.o.   MRN: [id number]  HPI  Nicole Wheeler is here today for follow up of chronic medical problem.  Outpatient Encounter Prescriptions as of 08/04/2016  Medication Sig  . acetaminophen (TYLENOL) 500 MG tablet Take 500 mg by mouth daily.   . aspirin EC 81 MG tablet Take 81 mg by mouth daily.  . CALCIUM PO Take 600 mg by mouth every morning.   . cholecalciferol (VITAMIN D) 1000 units tablet Take 1,000 Units by mouth daily.  . diphenhydramine-acetaminophen (TYLENOL PM) 25-500 MG TABS tablet Take 1 tablet by mouth at bedtime as needed.  . fish oil-omega-3 fatty acids 1000 MG capsule Take 1 capsule by mouth daily.  . hydrochlorothiazide (HYDRODIURIL) 25 MG tablet TAKE 1 TABLET DAILY  . latanoprost (XALATAN) 0.005 % ophthalmic solution   . levothyroxine (SYNTHROID, LEVOTHROID) 100 MCG tablet Take 1 tablet (100 mcg total) by mouth daily.  . Multiple Vitamins-Minerals (CENTRUM SILVER PO) Take by mouth.  . naproxen sodium (ANAPROX) 220 MG tablet Take 220 mg by mouth daily. Takes at night time daily   . raloxifene (EVISTA) 60 MG tablet TAKE 1 TABLET DAILY  . dorzolamide-timolol (COSOPT) 22.3-6.8 MG/ML ophthalmic solution    No facility-administered encounter medications on file as of 08/04/2016.     1. Essential hypertension  No c/o chest pain , SOB or HA- dos not check blood pressure at home  2. Hypothyroidism, unspecified type  No problems that she is aware of  3. Osteopenia, unspecified location  does some weight bearing exercises a couple times a week- no c/o back pain  4. Pure hypercholesterolemia  Tries to watch diet    New complaints: Was seen on 07/26/16 with right wrist bursal cyst with some echymosis- still a little sore to touch.     Review of Systems  Constitutional: Negative for diaphoresis.  HENT: Negative.   Eyes: Negative for pain.  Respiratory: Negative for shortness of breath.     Cardiovascular: Negative for chest pain, palpitations and leg swelling.  Gastrointestinal: Negative for abdominal pain.  Endocrine: Negative for polydipsia.  Musculoskeletal: Positive for joint swelling (right wrist).  Skin: Negative for rash.  Neurological: Negative for dizziness, weakness and headaches.  Hematological: Does not bruise/bleed easily.  All other systems reviewed and are negative.      Objective:   Physical Exam  Constitutional: She is oriented to person, place, and time. She appears well-developed and well-nourished.  HENT:  Nose: Nose normal.  Mouth/Throat: Oropharynx is clear and moist.  Eyes: EOM are normal.  Neck: Trachea normal, normal range of motion and full passive range of motion without pain. Neck supple. No JVD present. Carotid bruit is not present. No thyromegaly present.  Cardiovascular: Normal rate, regular rhythm, normal heart sounds and intact distal pulses.  Exam reveals no gallop and no friction rub.   No murmur heard. Pulmonary/Chest: Effort normal and breath sounds normal.  Abdominal: Soft. Bowel sounds are normal. She exhibits no distension and no mass. There is no tenderness.  Musculoskeletal: Normal range of motion.  Right wrist contusion with mild edema- palpable cyst dorsal surface of wrist  Lymphadenopathy:    She has no cervical adenopathy.  Neurological: She is alert and oriented to person, place, and time. She has normal reflexes.  Skin: Skin is warm and dry.  Psychiatric: She has a normal mood and affect. Her behavior is normal. Judgment and thought content   normal.   BP 132/72   Pulse 72   Temp 97.9 F (36.6 C) (Oral)   Ht 5' 2" (1.575 m)   Wt 162 lb 12.8 oz (73.8 kg)   BMI 29.78 kg/m      Assessment & Plan:  1. Essential hypertension Low sodium diet - CMP14+EGFR - hydrochlorothiazide (HYDRODIURIL) 25 MG tablet; Take 1 tablet (25 mg total) by mouth daily.  Dispense: 90 tablet; Refill: 1  2. Hypothyroidism, unspecified  type - Thyroid Panel With TSH - levothyroxine (SYNTHROID, LEVOTHROID) 100 MCG tablet; Take 1 tablet (100 mcg total) by mouth daily.  Dispense: 90 tablet; Refill: 2  3. Osteopenia, unspecified location Weight bearing exercise encouraged - raloxifene (EVISTA) 60 MG tablet; Take 1 tablet (60 mg total) by mouth daily.  Dispense: 90 tablet; Refill: 1  4. Pure hypercholesterolemia Low fat diet encouraged - Lipid panel    Labs pending Health maintenance reviewed Diet and exercise encouraged Continue all meds Follow up  In 6 months   Mary-Margaret Martin, FNP   

## 2016-08-05 LAB — CMP14+EGFR
ALT: 14 IU/L (ref 0–32)
AST: 19 IU/L (ref 0–40)
Albumin/Globulin Ratio: 1.7 (ref 1.2–2.2)
Albumin: 4.1 g/dL (ref 3.5–4.7)
Alkaline Phosphatase: 92 IU/L (ref 39–117)
BUN/Creatinine Ratio: 23 (ref 12–28)
BUN: 29 mg/dL — AB (ref 8–27)
Bilirubin Total: 0.3 mg/dL (ref 0.0–1.2)
CALCIUM: 9.5 mg/dL (ref 8.7–10.3)
CHLORIDE: 102 mmol/L (ref 96–106)
CO2: 26 mmol/L (ref 18–29)
CREATININE: 1.24 mg/dL — AB (ref 0.57–1.00)
GFR calc non Af Amer: 40 mL/min/{1.73_m2} — ABNORMAL LOW (ref >59)
GFR, EST AFRICAN AMERICAN: 46 mL/min/{1.73_m2} — AB (ref >59)
GLUCOSE: 92 mg/dL (ref 65–99)
Globulin, Total: 2.4 g/dL (ref 1.5–4.5)
Potassium: 4.4 mmol/L (ref 3.5–5.2)
Sodium: 143 mmol/L (ref 134–144)
TOTAL PROTEIN: 6.5 g/dL (ref 6.0–8.5)

## 2016-08-05 LAB — LIPID PANEL
Chol/HDL Ratio: 3.8 ratio (ref 0.0–4.4)
Cholesterol, Total: 180 mg/dL (ref 100–199)
HDL: 48 mg/dL (ref >39)
LDL CALC: 113 mg/dL — AB (ref 0–99)
Triglycerides: 96 mg/dL (ref 0–149)
VLDL CHOLESTEROL CAL: 19 mg/dL (ref 5–40)

## 2016-08-05 LAB — THYROID PANEL WITH TSH
Free Thyroxine Index: 2.4 (ref 1.2–4.9)
T3 Uptake Ratio: 31 % (ref 24–39)
T4, Total: 7.8 ug/dL (ref 4.5–12.0)
TSH: 1.06 u[IU]/mL (ref 0.450–4.500)

## 2016-09-19 ENCOUNTER — Ambulatory Visit (INDEPENDENT_AMBULATORY_CARE_PROVIDER_SITE_OTHER): Payer: Medicare Other | Admitting: *Deleted

## 2016-09-19 VITALS — BP 137/73 | HR 58 | Ht 62.0 in | Wt 163.0 lb

## 2016-09-19 DIAGNOSIS — Z0000 Encounter for general adult medical examination without abnormal findings: Secondary | ICD-10-CM | POA: Diagnosis not present

## 2016-09-19 NOTE — Patient Instructions (Addendum)
  Ms. Poppell ,  Thank you for taking time to come for your Medicare Wellness Visit. I appreciate your ongoing commitment to your health goals. Please review the following plan we discussed and let me know if I can assist you in the future.   These are the goals we discussed: Goals    . Exercise 150 minutes per week (moderate activity)          Chair exercises daily and walk 30 minutes daily as tolereated      Check with your insurance company about the cost of Shingrix (shingles vaccine). Bring a copy of your Advance Directives (Living will, power of attorney) to our office.   This is a list of the screening recommended for you and due dates:  Health Maintenance  Topic Date Due  . Flu Shot  10/11/2016  . Mammogram  05/03/2017  . DEXA scan (bone density measurement)  01/03/2018  . Tetanus Vaccine  11/11/2020  . Pneumonia vaccines  Completed

## 2016-09-20 NOTE — Progress Notes (Signed)
Subjective:   Nicole Wheeler is a 83 y.o. female who presents for an Initial Medicare Annual Wellness Visit. Nicole Wheeler at home alone. She has 2 sons, 1 daughter and 4 grandchildren. She does not have any pets. She enjoys sudoku puzzles, word search, crosswords and sewing.   Review of Systems    Reports that her health is about the same as last year.  Cardiac Risk Factors include: advanced age (>73men, >71 women);dyslipidemia;sedentary lifestyle  Other systems negative    Objective:    Today's Vitals   09/19/16 1030  BP: 137/73  Pulse: (!) 58  Weight: 163 lb (73.9 kg)  Height: 5\' 2"  (1.575 m)   Body mass index is 29.81 kg/m.   Current Medications (verified) Outpatient Encounter Prescriptions as of 09/19/2016  Medication Sig  . acetaminophen (TYLENOL) 500 MG tablet Take 500 mg by mouth daily.   Marland Kitchen aspirin EC 81 MG tablet Take 81 mg by mouth daily.  . Cholecalciferol (VITAMIN D) 2000 units CAPS Take 1,000 Units by mouth daily.  . diphenhydramine-acetaminophen (TYLENOL PM) 25-500 MG TABS tablet Take 0.5 tablets by mouth at bedtime as needed.   . dorzolamide-timolol (COSOPT) 22.3-6.8 MG/ML ophthalmic solution   . fish oil-omega-3 fatty acids 1000 MG capsule Take 1 capsule by mouth daily.  . hydrochlorothiazide (HYDRODIURIL) 25 MG tablet Take 1 tablet (25 mg total) by mouth daily.  Marland Kitchen latanoprost (XALATAN) 0.005 % ophthalmic solution   . levothyroxine (SYNTHROID, LEVOTHROID) 100 MCG tablet Take 1 tablet (100 mcg total) by mouth daily.  . Multiple Vitamins-Minerals (CENTRUM SILVER PO) Take by mouth.  . naproxen sodium (ANAPROX) 220 MG tablet Take 220 mg by mouth daily. Takes at night time daily   . raloxifene (EVISTA) 60 MG tablet Take 1 tablet (60 mg total) by mouth daily.  . [DISCONTINUED] CALCIUM PO Take 600 mg by mouth every morning.    No facility-administered encounter medications on file as of 09/19/2016.     Allergies (verified) Crestor [rosuvastatin calcium]  and Lipitor [atorvastatin]   History: Past Medical History:  Diagnosis Date  . Arthritis   . Cataract   . Glaucoma   . Hyperlipidemia   . Hypertension   . Hypothyroidism    dr don Laurance Flatten   pcp  . Macular degeneration   . Osteopenia   . Shingles   . Spinal stenosis    Past Surgical History:  Procedure Laterality Date  . APPENDECTOMY    . EYE SURGERY Right    cataract removal  . LUMBAR LAMINECTOMY/DECOMPRESSION MICRODISCECTOMY  12/07/2011   Procedure: LUMBAR LAMINECTOMY/DECOMPRESSION MICRODISCECTOMY 1 LEVEL;  Surgeon: Ophelia Charter, MD;  Location: [facility];  Service: Neurosurgery;  Laterality: Right;  Right Lumbar Five-Sacral One Diskectomy  . throidectomy    . TUBAL LIGATION     Family History  Problem Relation Age of Onset  . Stroke Mother 68  . Hypertension Mother   . Stroke Father 21  . Hypertension Father   . Heart attack Sister 41       heart attack  . Breast cancer Maternal Aunt   . Healthy Daughter   . Healthy Son   . Healthy Son    Social History   Occupational History  . Not on file.   Social History Main Topics  . Smoking status: Former Smoker    Quit date: 03/13/1978  . Smokeless tobacco: Never Used  . Alcohol use Yes     Comment: occ  . Drug use: No  .  Sexual activity: No    Tobacco Counseling No tobacco use  Activities of Daily Living In your present state of health, do you have any difficulty performing the following activities: 09/19/2016  Hearing? Y  Vision? N  Difficulty concentrating or making decisions? Y  Walking or climbing stairs? N  Dressing or bathing? N  Doing errands, shopping? N  Preparing Food and eating ? N  Using the Toilet? N  In the past six months, have you accidently leaked urine? Y  Do you have problems with loss of bowel control? N  Managing your Medications? N  Managing your Finances? N  Housekeeping or managing your Housekeeping? N  Some recent data might be hidden    Immunizations and Health  Maintenance Immunization History  Administered Date(s) Administered  . Influenza Split 12/08/2011, 12/14/2012  . Influenza, High Dose Seasonal PF 01/08/2014  . Influenza-Unspecified 01/13/2015, 12/20/2015  . Pneumococcal Conjugate-13 06/15/2014  . Pneumococcal Polysaccharide-23 03/13/1998   There are no preventive care reminders to display for this patient.  Patient Care Team: Chevis Pretty, FNP as PCP - General (Nurse Practitioner) Newman Pies, MD as Consulting Physician (Neurosurgery) Clent Jacks, MD as Consulting Physician (Ophthalmology)  No hospita;izatons, ER visits, or surgeries this year     Assessment:   This is a routine wellness examination for Nicole Wheeler.   Hearing/Vision screen No deficits noted. Eye exam 07/2016  Dietary issues and exercise activities discussed: Current Exercise Habits: The patient does not participate in regular exercise at present, Exercise limited by: orthopedic condition(s) (multiple joint pain)  Diet: 3 good meals a day  Goals    . Exercise 150 minutes per week (moderate activity)          Chair exercises daily and walk 30 minutes daily as tolereated      Depression Screen PHQ 2/9 Scores 09/19/2016 08/04/2016 07/26/2016 12/23/2015 11/24/2015 08/17/2015 05/20/2015  PHQ - 2 Score 0 0 0 0 0 0 0  PHQ- 9 Score - - - - - - -    Fall Risk Fall Risk  09/19/2016 08/04/2016 07/26/2016 12/23/2015 11/24/2015  Falls in the past year? No No No No No  Number falls in past yr: - - - - -  Injury with Fall? - - - - -  Follow up - - - - -    Cognitive Function: MMSE - Mini Mental State Exam 09/19/2016 08/17/2015 08/17/2015  Orientation to time 5 5 5   Orientation to Place 5 5 5   Registration 3 3 -  Attention/ Calculation 5 5 -  Recall 3 2 -  Language- name 2 objects 2 2 -  Language- repeat 1 1 -  Language- follow 3 step command 3 3 -  Language- read & follow direction 1 1 -  Write a sentence 1 1 -  Copy design 1 1 -  Total score 30 29 -          Screening Tests Health Maintenance  Topic Date Due  . INFLUENZA VACCINE  10/11/2016  . MAMMOGRAM  05/03/2017  . DEXA SCAN  01/03/2018  . TETANUS/TDAP  11/11/2020  . PNA vac Low Risk Adult  Completed      Plan:  Check with insurance about Shingrix coverage Chair exercises daily and walk 30 minutes a day as tolerated Scheduled f/u with Shelah Lewandowsky on 02/14/17 Bring copy of Advance Directives   I have personally reviewed and noted the following in the patient's chart:   . Medical and social history . Use of alcohol,  tobacco or illicit drugs  . Current medications and supplements . Functional ability and status . Nutritional status . Physical activity . Advanced directives . List of other physicians . Hospitalizations, surgeries, and ER visits in previous 12 months . Vitals . Screenings to include cognitive, depression, and falls . Referrals and appointments  In addition, I have reviewed and discussed with patient certain preventive protocols, quality metrics, and best practice recommendations. A written personalized care plan for preventive services as well as general preventive health recommendations were provided to patient.     Ilean China, RN   09/20/2016   I have reviewed and agree with the above AWV documentation.   Mary-Margaret Hassell Done, FNP

## 2016-11-21 DIAGNOSIS — H401131 Primary open-angle glaucoma, bilateral, mild stage: Secondary | ICD-10-CM | POA: Diagnosis not present

## 2016-11-21 DIAGNOSIS — H353131 Nonexudative age-related macular degeneration, bilateral, early dry stage: Secondary | ICD-10-CM | POA: Diagnosis not present

## 2016-11-21 DIAGNOSIS — Z961 Presence of intraocular lens: Secondary | ICD-10-CM | POA: Diagnosis not present

## 2016-12-08 DIAGNOSIS — Z23 Encounter for immunization: Secondary | ICD-10-CM | POA: Diagnosis not present

## 2017-02-14 ENCOUNTER — Encounter: Payer: Self-pay | Admitting: Nurse Practitioner

## 2017-02-14 ENCOUNTER — Ambulatory Visit (INDEPENDENT_AMBULATORY_CARE_PROVIDER_SITE_OTHER): Payer: Medicare Other

## 2017-02-14 ENCOUNTER — Ambulatory Visit (INDEPENDENT_AMBULATORY_CARE_PROVIDER_SITE_OTHER): Payer: Medicare Other | Admitting: Nurse Practitioner

## 2017-02-14 VITALS — BP 137/70 | HR 65 | Temp 97.7°F | Ht 62.0 in | Wt 161.2 lb

## 2017-02-14 DIAGNOSIS — M8580 Other specified disorders of bone density and structure, unspecified site: Secondary | ICD-10-CM | POA: Diagnosis not present

## 2017-02-14 DIAGNOSIS — I10 Essential (primary) hypertension: Secondary | ICD-10-CM

## 2017-02-14 DIAGNOSIS — E039 Hypothyroidism, unspecified: Secondary | ICD-10-CM

## 2017-02-14 DIAGNOSIS — E7800 Pure hypercholesterolemia, unspecified: Secondary | ICD-10-CM

## 2017-02-14 MED ORDER — HYDROCHLOROTHIAZIDE 25 MG PO TABS: 25.0000 mg | ORAL_TABLET | Freq: Every day | ORAL | 1 refills | Status: DC

## 2017-02-14 MED ORDER — LEVOTHYROXINE SODIUM 100 MCG PO TABS: 100.0000 ug | ORAL_TABLET | Freq: Every day | ORAL | 2 refills | Status: DC

## 2017-02-14 MED ORDER — RALOXIFENE HCL 60 MG PO TABS: 60.0000 mg | ORAL_TABLET | Freq: Every day | ORAL | 1 refills | Status: DC

## 2017-02-14 NOTE — Progress Notes (Signed)
Subjective:    Patient ID: Nicole Wheeler, female    DOB: [birth year], 83 y.o.   MRN: [id number]  HPI   Nicole Wheeler is here today for follow up of chronic medical problem.  Outpatient Encounter Medications as of 02/14/2017  Medication Sig  . acetaminophen (TYLENOL) 500 MG tablet Take 500 mg by mouth daily.   Marland Kitchen aspirin EC 81 MG tablet Take 81 mg by mouth daily.  . Cholecalciferol (VITAMIN D) 2000 units CAPS Take 1,000 Units by mouth daily.  . diphenhydramine-acetaminophen (TYLENOL PM) 25-500 MG TABS tablet Take 0.5 tablets by mouth at bedtime as needed.   . dorzolamide-timolol (COSOPT) 22.3-6.8 MG/ML ophthalmic solution   . fish oil-omega-3 fatty acids 1000 MG capsule Take 1 capsule by mouth daily.  . hydrochlorothiazide (HYDRODIURIL) 25 MG tablet Take 1 tablet (25 mg total) by mouth daily.  Marland Kitchen latanoprost (XALATAN) 0.005 % ophthalmic solution   . levothyroxine (SYNTHROID, LEVOTHROID) 100 MCG tablet Take 1 tablet (100 mcg total) by mouth daily.  . Multiple Vitamins-Minerals (CENTRUM SILVER PO) Take by mouth.  . naproxen sodium (ANAPROX) 220 MG tablet Take 220 mg by mouth daily. Takes at night time daily   . raloxifene (EVISTA) 60 MG tablet Take 1 tablet (60 mg total) by mouth daily.     1. Essential hypertension  No c/o chest pain, SOB or headache. Does no check blood pressure at home. BP Readings from Last 3 Encounters:  02/14/17 137/70  09/19/16 137/73  08/04/16 132/72     2. Hypothyroidism, unspecified type  No problems  3. Osteopenia, unspecified location  Tries to do weight bearing exercises as often as possible  4. Pure hypercholesterolemia  Tries to watch diet    New complaints: Palpitations at night-lasting about 5-10 minutes. No sob  Social history: Lives alone   Review of Systems  Constitutional: Negative for activity change and appetite change.  HENT: Negative.   Eyes: Negative for pain.  Respiratory: Negative for shortness of breath.   Cardiovascular:  Negative for chest pain, palpitations and leg swelling.  Gastrointestinal: Negative for abdominal pain.  Endocrine: Negative for polydipsia.  Genitourinary: Negative.   Skin: Negative for rash.  Neurological: Negative for dizziness, weakness and headaches.  Hematological: Does not bruise/bleed easily.  Psychiatric/Behavioral: Negative.   All other systems reviewed and are negative.      Objective:   Physical Exam  Constitutional: She is oriented to person, place, and time. She appears well-developed and well-nourished.  HENT:  Nose: Nose normal.  Mouth/Throat: Oropharynx is clear and moist.  Eyes: EOM are normal.  Neck: Trachea normal, normal range of motion and full passive range of motion without pain. Neck supple. No JVD present. Carotid bruit is not present. No thyromegaly present.  Cardiovascular: Normal rate, regular rhythm, normal heart sounds and intact distal pulses. Exam reveals no gallop and no friction rub.  No murmur heard. Pulmonary/Chest: Effort normal and breath sounds normal.  Abdominal: Soft. Bowel sounds are normal. She exhibits no distension and no mass. There is no tenderness.  Musculoskeletal: Normal range of motion.  Lymphadenopathy:    She has no cervical adenopathy.  Neurological: She is alert and oriented to person, place, and time. She has normal reflexes.  Skin: Skin is warm and dry.  Psychiatric: She has a normal mood and affect. Her behavior is normal. Judgment and thought content normal.   BP 137/70   Pulse 65   Temp 97.7 F (36.5 C) (Oral)   Ht 5' 2"  (  1.575 m)   Wt 161 lb 3.2 oz (73.1 kg)   BMI 29.48 kg/m   EKG- NSR with occasional Emeline General, FNP Chest xray- unchanged from previous-Preliminary reading by Ronnald Collum, FNP  [facility]     Assessment & Plan:  1. Essential hypertension Low sodium diet - EKG 12-Lead - DG Chest 2 View; Future - hydrochlorothiazide (HYDRODIURIL) 25 MG tablet; Take 1 tablet (25 mg total) by mouth  daily.  Dispense: 90 tablet; Refill: 1 - CMP14+EGFR - CBC with Differential/Platelet  2. Hypothyroidism, unspecified type - levothyroxine (SYNTHROID, LEVOTHROID) 100 MCG tablet; Take 1 tablet (100 mcg total) by mouth daily.  Dispense: 90 tablet; Refill: 2 - Thyroid Panel With TSH  3. Osteopenia, unspecified location Weight bearing exercises when can tolerate - raloxifene (EVISTA) 60 MG tablet; Take 1 tablet (60 mg total) by mouth daily.  Dispense: 90 tablet; Refill: 1  4. Pure hypercholesterolemia Low fat diet - Lipid panel    Labs pending Health maintenance reviewed Diet and exercise encouraged Continue all meds Follow up  In 6 months   [place], FNP

## 2017-02-14 NOTE — Patient Instructions (Signed)

## 2017-02-15 LAB — CBC WITH DIFFERENTIAL/PLATELET
BASOS ABS: 0.1 10*3/uL (ref 0.0–0.2)
BASOS: 1 %
EOS (ABSOLUTE): 0.2 10*3/uL (ref 0.0–0.4)
Eos: 2 %
HEMATOCRIT: 38.9 % (ref 34.0–46.6)
HEMOGLOBIN: 12.3 g/dL (ref 11.1–15.9)
Immature Grans (Abs): 0 10*3/uL (ref 0.0–0.1)
Immature Granulocytes: 0 %
LYMPHS ABS: 1.6 10*3/uL (ref 0.7–3.1)
Lymphs: 21 %
MCH: 30.4 pg (ref 26.6–33.0)
MCHC: 31.6 g/dL (ref 31.5–35.7)
MCV: 96 fL (ref 79–97)
MONOS ABS: 0.6 10*3/uL (ref 0.1–0.9)
Monocytes: 8 %
NEUTROS ABS: 5.1 10*3/uL (ref 1.4–7.0)
Neutrophils: 68 %
Platelets: 278 10*3/uL (ref 150–379)
RBC: 4.05 x10E6/uL (ref 3.77–5.28)
RDW: 14.1 % (ref 12.3–15.4)
WBC: 7.5 10*3/uL (ref 3.4–10.8)

## 2017-02-15 LAB — CMP14+EGFR
A/G RATIO: 1.8 (ref 1.2–2.2)
ALBUMIN: 4.3 g/dL (ref 3.5–4.7)
ALT: 15 IU/L (ref 0–32)
AST: 22 IU/L (ref 0–40)
Alkaline Phosphatase: 86 IU/L (ref 39–117)
BUN / CREAT RATIO: 30 — AB (ref 12–28)
BUN: 31 mg/dL — ABNORMAL HIGH (ref 8–27)
Bilirubin Total: 0.2 mg/dL (ref 0.0–1.2)
CALCIUM: 9.4 mg/dL (ref 8.7–10.3)
CO2: 24 mmol/L (ref 20–29)
Chloride: 104 mmol/L (ref 96–106)
Creatinine, Ser: 1.04 mg/dL — ABNORMAL HIGH (ref 0.57–1.00)
GFR, EST AFRICAN AMERICAN: 57 mL/min/{1.73_m2} — AB (ref >59)
GFR, EST NON AFRICAN AMERICAN: 49 mL/min/{1.73_m2} — AB (ref >59)
GLOBULIN, TOTAL: 2.4 g/dL (ref 1.5–4.5)
Glucose: 91 mg/dL (ref 65–99)
POTASSIUM: 4.8 mmol/L (ref 3.5–5.2)
SODIUM: 143 mmol/L (ref 134–144)
Total Protein: 6.7 g/dL (ref 6.0–8.5)

## 2017-02-15 LAB — LIPID PANEL
Chol/HDL Ratio: 3.9 ratio (ref 0.0–4.4)
Cholesterol, Total: 185 mg/dL (ref 100–199)
HDL: 47 mg/dL (ref >39)
LDL CALC: 117 mg/dL — AB (ref 0–99)
TRIGLYCERIDES: 103 mg/dL (ref 0–149)
VLDL Cholesterol Cal: 21 mg/dL (ref 5–40)

## 2017-02-15 LAB — THYROID PANEL WITH TSH
FREE THYROXINE INDEX: 2.6 (ref 1.2–4.9)
T3 UPTAKE RATIO: 32 % (ref 24–39)
T4 TOTAL: 8.2 ug/dL (ref 4.5–12.0)
TSH: 1.02 u[IU]/mL (ref 0.450–4.500)

## 2017-03-29 DIAGNOSIS — H401132 Primary open-angle glaucoma, bilateral, moderate stage: Secondary | ICD-10-CM | POA: Diagnosis not present

## 2017-05-07 ENCOUNTER — Other Ambulatory Visit: Payer: Self-pay | Admitting: Family Medicine

## 2017-05-07 DIAGNOSIS — Z1231 Encounter for screening mammogram for malignant neoplasm of breast: Secondary | ICD-10-CM

## 2017-05-29 ENCOUNTER — Ambulatory Visit
Admission: RE | Admit: 2017-05-29 | Discharge: 2017-05-29 | Disposition: A | Discharge status: Home / Self Care | Payer: Medicare Other | Source: Ambulatory Visit | Attending: Family Medicine | Admitting: Family Medicine

## 2017-05-29 DIAGNOSIS — Z1231 Encounter for screening mammogram for malignant neoplasm of breast: Secondary | ICD-10-CM

## 2017-06-12 ENCOUNTER — Other Ambulatory Visit: Payer: Self-pay | Admitting: Physician Assistant

## 2017-06-12 DIAGNOSIS — D229 Melanocytic nevi, unspecified: Secondary | ICD-10-CM | POA: Diagnosis not present

## 2017-06-12 DIAGNOSIS — L821 Other seborrheic keratosis: Secondary | ICD-10-CM | POA: Diagnosis not present

## 2017-06-12 DIAGNOSIS — L820 Inflamed seborrheic keratosis: Secondary | ICD-10-CM | POA: Diagnosis not present

## 2017-06-12 DIAGNOSIS — L570 Actinic keratosis: Secondary | ICD-10-CM | POA: Diagnosis not present

## 2017-06-12 DIAGNOSIS — D485 Neoplasm of uncertain behavior of skin: Secondary | ICD-10-CM | POA: Diagnosis not present

## 2017-08-10 ENCOUNTER — Ambulatory Visit (INDEPENDENT_AMBULATORY_CARE_PROVIDER_SITE_OTHER): Payer: Medicare Other | Admitting: Nurse Practitioner

## 2017-08-10 ENCOUNTER — Encounter: Payer: Self-pay | Admitting: Nurse Practitioner

## 2017-08-10 VITALS — BP 130/75 | HR 62 | Temp 96.9°F | Ht 62.0 in | Wt 162.0 lb

## 2017-08-10 DIAGNOSIS — Z6829 Body mass index (BMI) 29.0-29.9, adult: Secondary | ICD-10-CM | POA: Diagnosis not present

## 2017-08-10 DIAGNOSIS — E039 Hypothyroidism, unspecified: Secondary | ICD-10-CM

## 2017-08-10 DIAGNOSIS — E7800 Pure hypercholesterolemia, unspecified: Secondary | ICD-10-CM | POA: Diagnosis not present

## 2017-08-10 DIAGNOSIS — M8580 Other specified disorders of bone density and structure, unspecified site: Secondary | ICD-10-CM

## 2017-08-10 DIAGNOSIS — I10 Essential (primary) hypertension: Secondary | ICD-10-CM | POA: Diagnosis not present

## 2017-08-10 DIAGNOSIS — J301 Allergic rhinitis due to pollen: Secondary | ICD-10-CM

## 2017-08-10 MED ORDER — FLUTICASONE PROPIONATE 50 MCG/ACT NA SUSP: 2.0000 | Freq: Every day | NASAL | 6 refills | Status: DC

## 2017-08-10 MED ORDER — HYDROCHLOROTHIAZIDE 25 MG PO TABS: 25.0000 mg | ORAL_TABLET | Freq: Every day | ORAL | 1 refills | Status: DC

## 2017-08-10 MED ORDER — LEVOTHYROXINE SODIUM 100 MCG PO TABS: 100.0000 ug | ORAL_TABLET | Freq: Every day | ORAL | 2 refills | Status: DC

## 2017-08-10 MED ORDER — RALOXIFENE HCL 60 MG PO TABS: 60.0000 mg | ORAL_TABLET | Freq: Every day | ORAL | 1 refills | Status: DC

## 2017-08-10 NOTE — Progress Notes (Signed)
Subjective:    Patient ID: Nicole Wheeler, female    DOB: 1931-02-12, 84 y.o.   MRN: [id number]   Chief Complaint: Medical Management of Chronic Issues   HPI:  1. Essential hypertension  No c/o chest pain, sob or headache. Does not check blood pressure at home. BP Readings from Last 3 Encounters:  02/14/17 137/70  09/19/16 137/73  08/04/16 132/72     2. Hypothyroidism, unspecified type  No problems that she is aware of. Denies fatigue  3. Osteopenia, unspecified location  Last dexascan was 12/29/15 with t score of -0.3. No c/o back pain.  4. Pure hypercholesterolemia  Tries to watch diet most days. Does very little exercise  5.      BMI 29.0-29.9          No recent weight changes   Outpatient Encounter Medications as of 08/10/2017  Medication Sig  . aspirin EC 81 MG tablet Take 81 mg by mouth daily.  . Cholecalciferol (VITAMIN D) 2000 units CAPS Take 1,000 Units by mouth daily.  . diphenhydramine-acetaminophen (TYLENOL PM) 25-500 MG TABS tablet Take 0.5 tablets by mouth at bedtime as needed.   . dorzolamide-timolol (COSOPT) 22.3-6.8 MG/ML ophthalmic solution   . fish oil-omega-3 fatty acids 1000 MG capsule Take 1 capsule by mouth daily.  . hydrochlorothiazide (HYDRODIURIL) 25 MG tablet Take 1 tablet (25 mg total) by mouth daily.  Marland Kitchen latanoprost (XALATAN) 0.005 % ophthalmic solution   . levothyroxine (SYNTHROID, LEVOTHROID) 100 MCG tablet Take 1 tablet (100 mcg total) by mouth daily.  . Multiple Vitamins-Minerals (CENTRUM SILVER PO) Take by mouth.  . Multiple Vitamins-Minerals (PRESERVISION/LUTEIN PO) Take by mouth.  . naproxen sodium (ANAPROX) 220 MG tablet Take 220 mg by mouth daily. Takes at night time daily   . raloxifene (EVISTA) 60 MG tablet Take 1 tablet (60 mg total) by mouth daily.       New complaints: Allergies bothering her  Social history: Lives alone- talks with family daily and has friednds she see often    Review of Systems  Constitutional:  Negative for activity change and appetite change.  HENT: Negative.   Eyes: Negative for pain.  Respiratory: Negative for shortness of breath.   Cardiovascular: Negative for chest pain, palpitations and leg swelling.  Gastrointestinal: Negative for abdominal pain.  Endocrine: Negative for polydipsia.  Genitourinary: Negative.   Skin: Negative for rash.  Neurological: Negative for dizziness, weakness and headaches.  Hematological: Does not bruise/bleed easily.  Psychiatric/Behavioral: Negative.   All other systems reviewed and are negative.      Objective:   Physical Exam  Constitutional: She is oriented to person, place, and time. She appears well-developed and well-nourished. No distress.  HENT:  Head: Normocephalic.  Nose: Nose normal.  Mouth/Throat: Oropharynx is clear and moist.  Eyes: Pupils are equal, round, and reactive to light. EOM are normal.  Neck: Normal range of motion. Neck supple. No JVD present. Carotid bruit is not present.  Cardiovascular: Normal rate, regular rhythm, normal heart sounds and intact distal pulses.  Pulmonary/Chest: Effort normal and breath sounds normal. No respiratory distress. She has no wheezes. She has no rales. She exhibits no tenderness.  Abdominal: Soft. Normal appearance, normal aorta and bowel sounds are normal. She exhibits no distension, no abdominal bruit, no pulsatile midline mass and no mass. There is no splenomegaly or hepatomegaly. There is no tenderness.  Musculoskeletal: Normal range of motion. She exhibits no edema.  Lymphadenopathy:    She has no cervical adenopathy.  Neurological: She is alert and oriented to person, place, and time. She has normal reflexes.  Skin: Skin is warm and dry.  Psychiatric: She has a normal mood and affect. Her behavior is normal. Judgment and thought content normal.  Nursing note and vitals reviewed.   BP 130/75   Pulse 62   Temp (!) 96.9 F (36.1 C) (Oral)   Ht 5' 2"  (1.575 m)   Wt 162 lb  (73.5 kg)   BMI 29.63 kg/m        Assessment & Plan:  Nicole Wheeler comes in today with chief complaint of Medical Management of Chronic Issues   Diagnosis and orders addressed:  1. Essential hypertension Low sodium diet - hydrochlorothiazide (HYDRODIURIL) 25 MG tablet; Take 1 tablet (25 mg total) by mouth daily.  Dispense: 90 tablet; Refill: 1 - CMP14+EGFR  2. Hypothyroidism, unspecified type - levothyroxine (SYNTHROID, LEVOTHROID) 100 MCG tablet; Take 1 tablet (100 mcg total) by mouth daily.  Dispense: 90 tablet; Refill: 2 - Thyroid Panel With TSH  3. Osteopenia, unspecified location Eight bearing exercises - raloxifene (EVISTA) 60 MG tablet; Take 1 tablet (60 mg total) by mouth daily.  Dispense: 90 tablet; Refill: 1  4. Pure hypercholesterolemia Low fat diet - Lipid panel  5. BMI 29.0-29.9,adult Discussed diet and exercise for person with BMI >25 Will recheck weight in 3-6 months  6. Allergic rhinitis flonase 2 sprays daily Proper use of nasal spray discusssed   Labs pending Health Maintenance reviewed Diet and exercise encouraged  Follow up plan: 6 months   [place], FNP

## 2017-08-10 NOTE — Patient Instructions (Signed)

## 2017-08-11 LAB — CMP14+EGFR
A/G RATIO: 1.9 (ref 1.2–2.2)
ALBUMIN: 4.2 g/dL (ref 3.5–4.7)
ALK PHOS: 90 IU/L (ref 39–117)
ALT: 14 IU/L (ref 0–32)
AST: 21 IU/L (ref 0–40)
BILIRUBIN TOTAL: 0.2 mg/dL (ref 0.0–1.2)
BUN / CREAT RATIO: 23 (ref 12–28)
BUN: 25 mg/dL (ref 8–27)
CHLORIDE: 102 mmol/L (ref 96–106)
CO2: 26 mmol/L (ref 20–29)
CREATININE: 1.08 mg/dL — AB (ref 0.57–1.00)
Calcium: 9.3 mg/dL (ref 8.7–10.3)
GFR calc Af Amer: 54 mL/min/{1.73_m2} — ABNORMAL LOW (ref >59)
GFR calc non Af Amer: 47 mL/min/{1.73_m2} — ABNORMAL LOW (ref >59)
GLOBULIN, TOTAL: 2.2 g/dL (ref 1.5–4.5)
Glucose: 85 mg/dL (ref 65–99)
POTASSIUM: 4.5 mmol/L (ref 3.5–5.2)
SODIUM: 142 mmol/L (ref 134–144)
Total Protein: 6.4 g/dL (ref 6.0–8.5)

## 2017-08-11 LAB — LIPID PANEL
CHOLESTEROL TOTAL: 184 mg/dL (ref 100–199)
Chol/HDL Ratio: 4 ratio (ref 0.0–4.4)
HDL: 46 mg/dL (ref >39)
LDL Calculated: 115 mg/dL — ABNORMAL HIGH (ref 0–99)
Triglycerides: 115 mg/dL (ref 0–149)
VLDL Cholesterol Cal: 23 mg/dL (ref 5–40)

## 2017-08-11 LAB — THYROID PANEL WITH TSH
Free Thyroxine Index: 2.9 (ref 1.2–4.9)
T3 UPTAKE RATIO: 34 % (ref 24–39)
T4 TOTAL: 8.5 ug/dL (ref 4.5–12.0)
TSH: 0.918 u[IU]/mL (ref 0.450–4.500)

## 2017-09-20 ENCOUNTER — Encounter: Payer: Self-pay | Admitting: *Deleted

## 2017-09-20 ENCOUNTER — Ambulatory Visit (INDEPENDENT_AMBULATORY_CARE_PROVIDER_SITE_OTHER): Payer: Medicare Other | Admitting: Nurse Practitioner

## 2017-09-20 ENCOUNTER — Ambulatory Visit (INDEPENDENT_AMBULATORY_CARE_PROVIDER_SITE_OTHER): Payer: Medicare Other

## 2017-09-20 ENCOUNTER — Ambulatory Visit (INDEPENDENT_AMBULATORY_CARE_PROVIDER_SITE_OTHER): Payer: Medicare Other | Admitting: *Deleted

## 2017-09-20 ENCOUNTER — Encounter: Payer: Self-pay | Admitting: Nurse Practitioner

## 2017-09-20 VITALS — BP 134/65 | HR 61 | Ht 60.75 in | Wt 163.0 lb

## 2017-09-20 VITALS — BP 134/65 | HR 61 | Temp 96.6°F | Ht 60.0 in | Wt 163.0 lb

## 2017-09-20 DIAGNOSIS — R05 Cough: Secondary | ICD-10-CM

## 2017-09-20 DIAGNOSIS — R053 Chronic cough: Secondary | ICD-10-CM

## 2017-09-20 DIAGNOSIS — Z0000 Encounter for general adult medical examination without abnormal findings: Secondary | ICD-10-CM | POA: Diagnosis not present

## 2017-09-20 DIAGNOSIS — R058 Other specified cough: Secondary | ICD-10-CM

## 2017-09-20 MED ORDER — METHYLPREDNISOLONE ACETATE 80 MG/ML IJ SUSP
80.0000 mg | Freq: Once | INTRAMUSCULAR | Status: AC
  Administered 2017-09-20: 80 mg via INTRAMUSCULAR

## 2017-09-20 NOTE — Progress Notes (Addendum)
Subjective:   Nicole Wheeler is a 84 y.o. female who presents for a Medicare Annual Wellness Visit. Jermiyah lives at home alone. Her husband passed in 2011. She has 3 children and is active in her church. 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 She plays bridge with friends regularly and also volunteers for [employer].    Review of Systems    Patient reports that her overall health is unchanged compared to last year.  Cardiac Risk Factors include: advanced age (>42men, >23 women);hypertension  Resp: cough for 2 months. Has some white sputum at times. Denies shortness of breath or chest pain.   HEENT: She does have a mild intermittent runny nose. Denies post nasal drainage or sore throat.   GI: Denies symptoms of GERD  All other systems negative       Current Medications (verified) Outpatient Encounter Medications as of 09/20/2017  Medication Sig  . aspirin EC 81 MG tablet Take 81 mg by mouth daily.  . Cholecalciferol (VITAMIN D) 2000 units CAPS Take 1,000 Units by mouth daily.  . diphenhydramine-acetaminophen (TYLENOL PM) 25-500 MG TABS tablet Take 0.5 tablets by mouth at bedtime as needed.   . dorzolamide-timolol (COSOPT) 22.3-6.8 MG/ML ophthalmic solution   . fish oil-omega-3 fatty acids 1000 MG capsule Take 1 capsule by mouth daily.  . hydrochlorothiazide (HYDRODIURIL) 25 MG tablet Take 1 tablet (25 mg total) by mouth daily.  Marland Kitchen latanoprost (XALATAN) 0.005 % ophthalmic solution   . levothyroxine (SYNTHROID, LEVOTHROID) 100 MCG tablet Take 1 tablet (100 mcg total) by mouth daily.  . Multiple Vitamins-Minerals (CENTRUM SILVER PO) Take by mouth.  . Multiple Vitamins-Minerals (PRESERVISION/LUTEIN PO) Take by mouth.  . naproxen sodium (ANAPROX) 220 MG tablet Take 220 mg by mouth daily. Takes at night time daily   . raloxifene (EVISTA) 60 MG tablet Take 1 tablet (60 mg total) by mouth daily.  . fluticasone (FLONASE) 50  MCG/ACT nasal spray Place 2 sprays into both nostrils daily.   No facility-administered encounter medications on file as of 09/20/2017.     Allergies (verified) Crestor [rosuvastatin calcium] and Lipitor [atorvastatin]   History: Past Medical History:  Diagnosis Date  . Arthritis   . Cataract   . Glaucoma   . Hyperlipidemia   . Hypertension   . Hypothyroidism    dr don Laurance Flatten   pcp  . Macular degeneration   . Osteopenia   . Shingles   . Spinal stenosis    Past Surgical History:  Procedure Laterality Date  . APPENDECTOMY    . EYE SURGERY Right    cataract removal  . LUMBAR LAMINECTOMY/DECOMPRESSION MICRODISCECTOMY  12/07/2011   Procedure: LUMBAR LAMINECTOMY/DECOMPRESSION MICRODISCECTOMY 1 LEVEL;  Surgeon: Ophelia Charter, MD;  Location: [facility];  Service: Neurosurgery;  Laterality: Right;  Right Lumbar Five-Sacral One Diskectomy  . throidectomy    . TUBAL LIGATION     Family History  Problem Relation Age of Onset  . Stroke Mother 75  . Hypertension Mother   . Stroke Father 68  . Hypertension Father   . Heart attack Sister 24       heart attack  . Breast cancer Maternal Aunt   . Healthy Daughter   . Healthy Son   . Healthy Son    Social History   Socioeconomic History  . Marital status: Widowed    Spouse name: Not on file  . Number of children: 3  .  Years of education: 57  . Highest education level: Some college, no degree  Occupational History  . 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 Financial resource strain: Not hard at all  . Food insecurity:    Worry: Never true    Inability: Never true  . Transportation needs:    Medical: No    Non-medical: No  Tobacco Use  . Smoking status: Former Smoker    Packs/day: 1.00    Years: 25.00    Pack years: 25.00    Types: Cigarettes    Last attempt to quit: 03/13/1978    Years since quitting: 39.5  . Smokeless tobacco: Never Used  Substance and Sexual Activity  . Alcohol use:  Yes    Comment: occ  . Drug use: No  . Sexual activity: Never  Lifestyle  . Physical activity:    Days per week: 3 days    Minutes per session: 60 min  . Stress: Not at all  Relationships  . Social connections:    Talks on phone: More than three times a week    Gets together: More than three times a week    Attends religious service: More than 4 times per year    Active member of club or organization: Yes    Attends meetings of clubs or organizations: More than 4 times per year    Relationship status: Widowed  Other Topics Concern  . Not on file  Social History Narrative  . Not on file    Tobacco Use No.  Clinical Intake:      How often do you need to have someone help you when you read instructions, pamphlets, or other written materials from your doctor or pharmacy?: 1 - Never What is the last grade level you completed in school?: one year of college  Interpreter Needed?: No  Information entered by :: Chong Sicilian, RN   Activities of Daily Living In your present state of health, do you have any difficulty performing the following activities: 09/20/2017  Hearing? Y  Comment has noticed some difficulty with hearing in crowds or in church  Vision? N  Comment has glaucoma but is being treated and seen regularly  Difficulty concentrating or making decisions? N  Walking or climbing stairs? N  Dressing or bathing? N  Doing errands, shopping? N  Preparing Food and eating ? N  Using the Toilet? N  In the past six months, have you accidently leaked urine? N  Do you have problems with loss of bowel control? N  Managing your Medications? N  Managing your Finances? N  Housekeeping or managing your Housekeeping? N  Some recent data might be hidden     Diet Has been eating smaller portions recently. Eats at home and eats out.   Exercise Current Exercise Habits: Home exercise routine, Type of exercise: walking, Time (Minutes): 30, Frequency (Times/Week): 4, Weekly  Exercise (Minutes/Week): 120, Intensity: Moderate, Exercise limited by: None identified   Depression Screen PHQ 2/9 Scores 09/20/2017 02/14/2017 09/19/2016 08/04/2016 07/26/2016 12/23/2015 11/24/2015  PHQ - 2 Score 0 0 0 0 0 0 0  PHQ- 9 Score - - - - - - -     Fall Risk Fall Risk  09/20/2017 02/14/2017 09/19/2016 08/04/2016 07/26/2016  Falls in the past year? No No No No No  Comment - - - - -  Number falls in past yr: - - - - -  Injury with Fall? - - - - -  Follow up - - - - -    Safety Is the patient's home free of loose throw rugs in walkways, pet beds, electrical cords, etc?   no      Grab bars in the bathroom? no      Walkin shower? yes      Shower Seat? no      Handrails on the stairs?   yes      Adequate lighting?   yes  Enters and exits through her garage that is attached to her basement. She has 10 steps into house but navigates them well. She does have handrails. She carries her laundry up and down the stairs as well. She is cautious and will take a few steps and sit the basket down.  Patient Care Team: Chevis Pretty, FNP as PCP - General (Nurse Practitioner) Newman Pies, MD as Consulting Physician (Neurosurgery) Clent Jacks, MD as Consulting Physician (Ophthalmology)   No hospitalizations, ER visits, or surgeries this past year.  Objective:    Today's Vitals   09/20/17 1019  BP: 134/65  Pulse: 61  Weight: 163 lb (73.9 kg)  Height: 5' 0.75" (1.543 m)   Body mass index is 31.05 kg/m.  Advanced Directives 09/20/2017 08/17/2015 09/17/2014 02/09/2014 12/06/2011  Does Patient Have a Medical Advance Directive? Yes Yes Yes Yes Patient has advance directive, copy not in chart  Type of Advance Directive Living will;Healthcare Power of Attorney Living will;Healthcare Power of [place] will [place]  Does patient want to make changes to medical advance directive? No - Patient declined No - Patient declined - - -  Copy of [place] in Chart? No - copy requested No - copy requested - - -    Hearing/Vision  normal or No deficits noted during visit.  Cognitive Function: MMSE - Mini Mental State Exam 09/20/2017 09/19/2016 08/17/2015 08/17/2015  Orientation to time 5 5 5 5   Orientation to Place 5 5 5 5   Registration 3 3 3  -  Attention/ Calculation 5 5 5  -  Recall 3 3 2  -  Language- name 2 objects 2 2 2  -  Language- repeat 1 1 1  -  Language- follow 3 step command 3 3 3  -  Language- read & follow direction 1 1 1  -  Write a sentence 1 1 1  -  Copy design 1 1 1  -  Total score 30 30 29  -       Normal Cognitive Function Screening: Yes    Immunizations and Health Maintenance Immunization History  Administered Date(s) Administered  . Influenza Split 12/08/2011, 12/14/2012  . Influenza, High Dose Seasonal PF 01/08/2014  . Influenza,inj,Quad PF,6+ Mos 12/11/2016  . Influenza-Unspecified 01/13/2015, 12/20/2015  . Pneumococcal Conjugate-13 06/15/2014  . Pneumococcal Polysaccharide-23 03/13/1998  . Tdap 12/08/2010   There are no preventive care reminders to display for this patient. Health Maintenance  Topic Date Due  . INFLUENZA VACCINE  10/11/2017  . DEXA SCAN  01/03/2018  . MAMMOGRAM  05/30/2018  . TETANUS/TDAP  12/07/2020  . PNA vac Low Risk Adult  Completed        Assessment:   This is a routine wellness examination for Tommie.    Plan:    Goals    . Exercise 150 min/wk Moderate Activity        Health Maintenance Recommendations: no recommendations at this time   Additional Screening Recommendations: Lung: Low Dose CT Chest recommended if Age 41-80 years, 30 pack-year currently smoking OR  have quit w/in 15years. Patient does not qualify. Hepatitis C Screening recommended: no  Today's Orders Orders Placed This Encounter  Procedures  . DG Chest 2 View    Standing Status:   Future    Number of Occurrences:   1    Standing Expiration Date:   11/22/2018    Order Specific Question:    Reason for Exam (SYMPTOM  OR DIAGNOSIS REQUIRED)    Answer:   persistent cough    Order Specific Question:   Preferred imaging location?    Answer:   Internal    Order Specific Question:   Radiology Contrast Protocol - do NOT remove file path    Answer:   [email or web address]    Keep f/u with Chevis Pretty, FNP and any other specialty appointments you may have Appt scheduled with PCP to evaluate cough Continue current medications Consider using back door to enter and exit since there are only 3 steps. Consider a new way to move laundry up and down the stairs or move laundry room upstairs.  Move carefully to avoid falls. Use assistive devices like a can or walker if needed. Aim for at least 150 minutes of moderate activity a week. This can be done with chair exercises if necessary. Read or work on puzzles daily Stay connected with friends and family  I have personally reviewed and noted the following in the patient's chart:   . Medical and social history . Use of alcohol, tobacco or illicit drugs  . Current medications and supplements . Functional ability and status . Nutritional status . Physical activity . Advanced directives . List of other physicians . Hospitalizations, surgeries, and ER visits in previous 12 months . Vitals . Screenings to include cognitive, depression, and falls . Referrals and appointments  In addition, I have reviewed and discussed with patient certain preventive protocols, quality metrics, and best practice recommendations. A written personalized care plan for preventive services as well as general preventive health recommendations were provided to patient.     Chong Sicilian, RN   09/20/2017   I have reviewed and agree with the above AWV documentation.   Mary-Margaret Hassell Done, FNP

## 2017-09-20 NOTE — Patient Instructions (Signed)
  Ms. Wooding, Thank you for taking time to come for your Medicare Wellness Visit. I appreciate your ongoing commitment to your health goals. Please review the following plan we discussed and let me know if I can assist you in the future.   These are the goals we discussed: Goals    . Exercise 150 min/wk Moderate Activity       This is a list of the screening recommended for you and due dates:  Health Maintenance  Topic Date Due  . Flu Shot  10/11/2017  . DEXA scan (bone density measurement)  01/03/2018  . Mammogram  05/30/2018  . Tetanus Vaccine  12/07/2020  . Pneumonia vaccines  Completed

## 2017-09-20 NOTE — Progress Notes (Signed)
   Subjective:    Patient ID: Nicole Wheeler, female    DOB: 1930-10-30, 84 y.o.   MRN: [id number]   Chief Complaint: Persistant cough   HPI Patient comes in today c/lo of a nagging cough. The cough started around mothers day weekend. After that still had cough and was given flonase which has helped. Cough is not constant but is usually productive. She did have a coughing spell and took some tums and that seemed to help.   Review of Systems  Constitutional: Negative for chills and fever.  HENT: Positive for postnasal drip and rhinorrhea. Negative for congestion.   Respiratory: Positive for cough (intermittent). Negative for shortness of breath.   Cardiovascular: Negative.   Genitourinary: Negative.   Neurological: Negative.   Psychiatric/Behavioral: Negative.   All other systems reviewed and are negative.      Objective:   Physical Exam  Constitutional: She is oriented to person, place, and time. She appears well-developed and well-nourished. No distress.  Neck: Normal range of motion. Neck supple.  Cardiovascular: Normal rate.  Pulmonary/Chest: Effort normal.  Lymphadenopathy:    She has no cervical adenopathy.  Neurological: She is alert and oriented to person, place, and time.  Skin: Skin is warm.  Psychiatric: She has a normal mood and affect. Her behavior is normal. Judgment and thought content normal.   BP 134/65   Pulse 61   Temp (!) 96.6 F (35.9 C) (Oral)   Ht 5' (1.524 m)   Wt 163 lb (73.9 kg)   BMI 31.83 kg/m       Assessment & Plan:  Nicole Wheeler in today with chief complaint of Persistant cough   1. Allergic cough Force fluids claritin OTC daily rto prn - methylPREDNISolone acetate (DEPO-MEDROL) injection 80 mg  Mary-Margaret Hassell Done, FNP

## 2017-09-20 NOTE — Patient Instructions (Signed)

## 2017-09-27 DIAGNOSIS — H02834 Dermatochalasis of left upper eyelid: Secondary | ICD-10-CM | POA: Diagnosis not present

## 2017-09-27 DIAGNOSIS — Z961 Presence of intraocular lens: Secondary | ICD-10-CM | POA: Diagnosis not present

## 2017-09-27 DIAGNOSIS — H401132 Primary open-angle glaucoma, bilateral, moderate stage: Secondary | ICD-10-CM | POA: Diagnosis not present

## 2017-09-27 DIAGNOSIS — H353131 Nonexudative age-related macular degeneration, bilateral, early dry stage: Secondary | ICD-10-CM | POA: Diagnosis not present

## 2017-09-27 DIAGNOSIS — H02831 Dermatochalasis of right upper eyelid: Secondary | ICD-10-CM | POA: Diagnosis not present

## 2017-12-29 DIAGNOSIS — Z23 Encounter for immunization: Secondary | ICD-10-CM | POA: Diagnosis not present

## 2018-01-23 ENCOUNTER — Ambulatory Visit (INDEPENDENT_AMBULATORY_CARE_PROVIDER_SITE_OTHER): Payer: Medicare Other | Admitting: Family Medicine

## 2018-01-23 VITALS — BP 162/78 | HR 68 | Temp 97.6°F | Ht 60.0 in | Wt 165.0 lb

## 2018-01-23 DIAGNOSIS — M6530 Trigger finger, unspecified finger: Secondary | ICD-10-CM

## 2018-01-23 DIAGNOSIS — I10 Essential (primary) hypertension: Secondary | ICD-10-CM | POA: Diagnosis not present

## 2018-01-23 NOTE — Progress Notes (Signed)
Subjective: CC: Hand pain PCP: Chevis Pretty, FNP [id number] P Holben is a 84 y.o. female presenting to clinic today for:  1. Hand pain Patient reports a 38-month history of locking of the middle and ring finger on the left hand.  She notes a palpable nodule below the ring finger of the left hand on the palmar side.  She reports that she actually has pain that radiates across the entire hand.  She has been taking half a tablet of Tylenol PM in the evening as well as a dose of Aleve each evening with little improvement in symptoms.  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  No sensation changes.  She has had to have injections in the right hand in the past.   ROS: Per HPI  Allergies  Allergen Reactions  . Crestor [Rosuvastatin Calcium]     Myalgia   . Lipitor [Atorvastatin]     Myalgia    Past Medical History:  Diagnosis Date  . Arthritis   . Cataract   . Glaucoma   . Hyperlipidemia   . Hypertension   . Hypothyroidism    dr don Laurance Flatten   pcp  . Macular degeneration   . Osteopenia   . Shingles   . Spinal stenosis     Current Outpatient Medications:  .  aspirin EC 81 MG tablet, Take 81 mg by mouth daily., Disp: , Rfl:  .  Cholecalciferol (VITAMIN D) 2000 units CAPS, Take 1,000 Units by mouth daily., Disp: , Rfl:  .  diphenhydramine-acetaminophen (TYLENOL PM) 25-500 MG TABS tablet, Take 0.5 tablets by mouth at bedtime as needed. , Disp: , Rfl:  .  dorzolamide-timolol (COSOPT) 22.3-6.8 MG/ML ophthalmic solution, , Disp: , Rfl:  .  fish oil-omega-3 fatty acids 1000 MG capsule, Take 1 capsule by mouth daily., Disp: , Rfl:  .  fluticasone (FLONASE) 50 MCG/ACT nasal spray, Place 2 sprays into both nostrils daily., Disp: 16 g, Rfl: 6 .  hydrochlorothiazide (HYDRODIURIL) 25 MG tablet, Take 1 tablet (25 mg total) by mouth daily., Disp: 90 tablet, Rfl: 1 .  latanoprost (XALATAN) 0.005 %  ophthalmic solution, , Disp: , Rfl:  .  levothyroxine (SYNTHROID, LEVOTHROID) 100 MCG tablet, Take 1 tablet (100 mcg total) by mouth daily., Disp: 90 tablet, Rfl: 2 .  Multiple Vitamins-Minerals (CENTRUM SILVER PO), Take by mouth., Disp: , Rfl:  .  Multiple Vitamins-Minerals (PRESERVISION/LUTEIN PO), Take by mouth., Disp: , Rfl:  .  naproxen sodium (ANAPROX) 220 MG tablet, Take 220 mg by mouth daily. Takes at night time daily , Disp: , Rfl:  .  raloxifene (EVISTA) 60 MG tablet, Take 1 tablet (60 mg total) by mouth daily., Disp: 90 tablet, Rfl: 1 Social History   Socioeconomic History  . Marital status: Widowed    Spouse name: Not on file  . Number of children: 3  . Years of education: 79  . Highest education level: Some college, no degree  Occupational History  . 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 Financial resource strain: Not hard at all  . Food insecurity:    Worry: Never true    Inability: Never true  . Transportation needs:    Medical: No    Non-medical: No  Tobacco Use  . Smoking status: Former Smoker    Packs/day: 1.00    Years: 25.00    Pack years:  25.00    Types: Cigarettes    Last attempt to quit: 03/13/1978    Years since quitting: 39.8  . Smokeless tobacco: Never Used  Substance and Sexual Activity  . Alcohol use: Yes    Comment: occ  . Drug use: No  . Sexual activity: Never  Lifestyle  . Physical activity:    Days per week: 3 days    Minutes per session: 60 min  . Stress: Not at all  Relationships  . Social connections:    Talks on phone: More than three times a week    Gets together: More than three times a week    Attends religious service: More than 4 times per year    Active member of club or organization: Yes    Attends meetings of clubs or organizations: More than 4 times per year    Relationship status: Widowed  . Intimate partner violence:    Fear of current or ex partner: No    Emotionally abused: No     Physically abused: No    Forced sexual activity: No  Other Topics Concern  . Not on file  Social History Narrative  . Not on file   Family History  Problem Relation Age of Onset  . Stroke Mother 51  . Hypertension Mother   . Stroke Father 86  . Hypertension Father   . Heart attack Sister 63       heart attack  . Breast cancer Maternal Aunt   . Healthy Daughter   . Healthy Son   . Healthy Son     Objective: Office vital signs reviewed. BP (!) 162/78   Pulse 68   Temp 97.6 F (36.4 C) (Oral)   Ht 5' (1.524 m)   Wt 165 lb (74.8 kg)   BMI 32.22 kg/m   Physical Examination:  General: Awake, alert, well nourished, No acute distress Extremities: warm, well perfused, No edema, cyanosis or clubbing; +2 pulses bilaterally MSK:   Left hand: Patient has full active range of motion of all fingers.  She does have a palpable nodule along the pulley system of the left ring finger.  No gross swelling or discoloration of the joints. Skin: dry; intact; no rashes or lesions Neuro: light touch sensation grossly in tact  Assessment/ Plan: 84 y.o. female   1. Trigger finger of left hand, unspecified finger I have recommended scheduled Tylenol for aching in the left hand.  I suspect that she has some component of arthritis given distribution of the pain along all joints within the left hand.  There is no gross swelling or discoloration to suggest an autoimmune process.  There is no preceding injury and therefore no x-ray was obtained.  With regards to the nodule in the palm of her left hand, this seems to be consistent with a trigger finger.  She has been set up for a corticosteroid injection with Dr. Warrick Parisian next week.  Handout provided explaining the nature of her diagnosis.  She will follow-up as needed as directed.  2. Elevated blood pressure reading in office with diagnosis of hypertension Compliant with hydrochlorothiazide.  Per her report during our visit today, she measures blood  pressures at home that are typically 130-140/70-80s.  Therefore medication has not been adjusted during today's visit.  I suspect that she is having a pain response and this is an objective finding of the pain.  She will follow-up with PCP for ongoing blood pressure checks.   Janora Norlander, DO Tristan Schroeder  [place] [phone]

## 2018-01-23 NOTE — Patient Instructions (Signed)
I suspect you have a component of arthritis within the left hand.  The not that you are feeling on your palm is something called a trigger finger.  This is what causes your finger to get stuck.  Again, we discussed that this is thickening of the tendon that attaches to the finger and hand.  We discussed scheduling Tylenol every 8 hours to help relieve some of the pain.  We were able to get you scheduled with Dr. Warrick Parisian, who can perform the steroid injection to the affected area.   Trigger Finger Trigger finger (stenosing tenosynovitis) is a condition that causes a finger to get stuck in a bent position. Each finger has a tough, cord-like tissue that connects muscle to bone (tendon), and each tendon is surrounded by a tunnel of tissue (tendon sheath). To move your finger, your tendon needs to slide freely through the sheath. Trigger finger happens when the tendon or the sheath thickens, making it difficult to move your finger. Trigger finger can affect any finger or a thumb. It may affect more than one finger. Mild cases may clear up with rest and medicine. Severe cases require more treatment. What are the causes? Trigger finger is caused by a thickened finger tendon or tendon sheath. The cause of this thickening is not known. What increases the risk? The following factors may make you more likely to develop this condition:  Doing activities that require a strong grip.  Having rheumatoid arthritis, gout, or diabetes.  Being 52-69 years old.  Being a woman.  What are the signs or symptoms? Symptoms of this condition include:  Pain when bending or straightening your finger.  Tenderness or swelling where your finger attaches to the palm of your hand.  A lump in the palm of your hand or on the inside of your finger.  Hearing a popping sound when you try to straighten your finger.  Feeling a popping, catching, or locking sensation when you try to straighten your finger.  Being unable to  straighten your finger.  How is this diagnosed? This condition is diagnosed based on your symptoms and a physical exam. How is this treated? This condition may be treated by:  Resting your finger and avoiding activities that make symptoms worse.  Wearing a finger splint to keep your finger in a slightly bent position.  Taking NSAIDs to relieve pain and swelling.  Injecting medicine (steroids) into the tendon sheath to reduce swelling and irritation. Injections may need to be repeated.  Having surgery to open the tendon sheath. This may be done if other treatments do not work and you cannot straighten your finger. You may need physical therapy after surgery.  Follow these instructions at home:  Use moist heat to help reduce pain and swelling as told by your health care provider.  Rest your finger and avoid activities that make pain worse. Return to normal activities as told by your health care provider.  If you have a splint, wear it as told by your health care provider.  Take over-the-counter and prescription medicines only as told by your health care provider.  Keep all follow-up visits as told by your health care provider. This is important. Contact a health care provider if:  Your symptoms are not improving with home care. Summary  Trigger finger (stenosing tenosynovitis) causes your finger to get stuck in a bent position, and it can make it difficult and painful to straighten your finger.  This condition develops when a finger tendon or tendon  sheath thickens.  Treatment starts with resting, wearing a splint, and taking NSAIDs.  In severe cases, surgery to open the tendon sheath may be needed. This information is not intended to replace advice given to you by your health care provider. Make sure you discuss any questions you have with your health care provider. Document Released: 12/18/2003 Document Revised: 02/08/2016 Document Reviewed: 02/08/2016 Elsevier Interactive  Patient Education  2017 [employer].

## 2018-01-30 ENCOUNTER — Ambulatory Visit (INDEPENDENT_AMBULATORY_CARE_PROVIDER_SITE_OTHER): Payer: Medicare Other | Admitting: Family Medicine

## 2018-01-30 ENCOUNTER — Encounter: Payer: Self-pay | Admitting: Family Medicine

## 2018-01-30 VITALS — BP 144/72 | HR 64 | Temp 96.9°F | Ht 60.0 in | Wt 164.4 lb

## 2018-01-30 DIAGNOSIS — M6530 Trigger finger, unspecified finger: Secondary | ICD-10-CM

## 2018-01-30 DIAGNOSIS — M65322 Trigger finger, left index finger: Secondary | ICD-10-CM

## 2018-01-30 MED ORDER — METHYLPREDNISOLONE ACETATE 80 MG/ML IJ SUSP
40.0000 mg | Freq: Once | INTRAMUSCULAR | Status: AC
  Administered 2018-01-30: 40 mg via INTRAMUSCULAR

## 2018-01-30 NOTE — Progress Notes (Signed)
BP (!) 144/72   Pulse 64   Temp (!) 96.9 F (36.1 C) (Oral)   Ht 5' (1.524 m)   Wt 164 lb 6.4 oz (74.6 kg)   BMI 32.11 kg/m    Subjective:    Patient ID: Nicole Wheeler, female    DOB: 08-25-1930, 84 y.o.   MRN: [id number]  HPI: Nicole Wheeler is a 84 y.o. female presenting on 01/30/2018 for trigger finger (Left hand. Patient requesting injection.)   HPI Trigger finger left hand Patient is coming in for a trigger finger of the left hand that has been bothering her more over the past month.  She says it finally is to the point where she would like to try an injection.  She does have pain and a knot over the anterior aspect of her hand overlying the tendon of the fourth digit of her left hand but also has a triggering finger on the third digit of her left hand.  She says occasionally she will get pain coming across the palmar surface of her hand just proximal to the metacarpal phalangeal joints but most of the time is just overlying where she has the knot on that one tendon.  She denies any fevers or chills or redness or warmth.  Relevant past medical, surgical, family and social history reviewed and updated as indicated. Interim medical history since our last visit reviewed. Allergies and medications reviewed and updated.  Review of Systems  Constitutional: Negative for chills and fever.  Eyes: Negative for visual disturbance.  Respiratory: Negative for chest tightness and shortness of breath.   Cardiovascular: Negative for chest pain and leg swelling.  Musculoskeletal: Positive for arthralgias. Negative for back pain and gait problem.  Skin: Negative for rash.  Neurological: Negative for light-headedness and headaches.  All other systems reviewed and are negative.   Per HPI unless specifically indicated above   Allergies as of 01/30/2018      Reactions   Crestor [rosuvastatin Calcium]    Myalgia   Lipitor [atorvastatin]    Myalgia      Medication List        Accurate as of 01/30/18 10:01 AM. Always use your most recent med list.          aspirin EC 81 MG tablet Take 81 mg by mouth daily.   CENTRUM SILVER PO Take by mouth.   PRESERVISION/LUTEIN PO Take by mouth.   diphenhydramine-acetaminophen 25-500 MG Tabs tablet Commonly known as:  TYLENOL PM Take 0.5 tablets by mouth at bedtime as needed.   dorzolamide-timolol 22.3-6.8 MG/ML ophthalmic solution Commonly known as:  COSOPT   fish oil-omega-3 fatty acids 1000 MG capsule Take 1 capsule by mouth daily.   hydrochlorothiazide 25 MG tablet Commonly known as:  HYDRODIURIL Take 1 tablet (25 mg total) by mouth daily.   latanoprost 0.005 % ophthalmic solution Commonly known as:  XALATAN   levothyroxine 100 MCG tablet Commonly known as:  SYNTHROID, LEVOTHROID Take 1 tablet (100 mcg total) by mouth daily.   naproxen sodium 220 MG tablet Commonly known as:  ALEVE Take 220 mg by mouth daily. Takes at night time daily   raloxifene 60 MG tablet Commonly known as:  EVISTA Take 1 tablet (60 mg total) by mouth daily.   Vitamin D 50 MCG (2000 UT) Caps Take 1,000 Units by mouth daily.          Objective:    BP (!) 144/72   Pulse 64   Temp (!) 96.9  F (36.1 C) (Oral)   Ht 5' (1.524 m)   Wt 164 lb 6.4 oz (74.6 kg)   BMI 32.11 kg/m   Wt Readings from Last 3 Encounters:  01/30/18 164 lb 6.4 oz (74.6 kg)  01/23/18 165 lb (74.8 kg)  09/20/17 163 lb (73.9 kg)    Physical Exam  Constitutional: She is oriented to person, place, and time. She appears well-developed and well-nourished. No distress.  Eyes: Conjunctivae are normal.  Musculoskeletal: Normal range of motion. She exhibits no edema.       Hands: Neurological: She is alert and oriented to person, place, and time.  Skin: Skin is warm and dry. She is not diaphoretic.  Nursing note and vitals reviewed.   Trigger finger left injection: Consent form signed. Risk factors of bleeding and infection discussed with patient and  patient is agreeable towards injection. Patient prepped with Betadine.  Anterior approach towards injection used. Injected 40 mg of Depo-Medrol and 1 mL of 2% lidocaine. Patient tolerated procedure well and no side effects from noted. Minimal to no bleeding. Simple bandage applied after.     Assessment & Plan:   Problem List Items Addressed This Visit    None    Visit Diagnoses    Trigger finger of left hand, unspecified finger    -  Primary   Relevant Medications   methylPREDNISolone acetate (DEPO-MEDROL) injection 40 mg (Completed)      Did trigger finger injection and will manage from there. Follow up plan: Return if symptoms worsen or fail to improve.  Counseling provided for all of the vaccine components No orders of the defined types were placed in this encounter.   Caryl Pina, MD [facility] 01/30/2018, 10:01 AM

## 2018-03-15 ENCOUNTER — Ambulatory Visit (INDEPENDENT_AMBULATORY_CARE_PROVIDER_SITE_OTHER): Payer: Medicare HMO | Admitting: Nurse Practitioner

## 2018-03-15 ENCOUNTER — Encounter: Payer: Self-pay | Admitting: Nurse Practitioner

## 2018-03-15 VITALS — BP 133/65 | HR 67 | Temp 97.1°F | Ht 60.0 in | Wt 162.0 lb

## 2018-03-15 DIAGNOSIS — E039 Hypothyroidism, unspecified: Secondary | ICD-10-CM

## 2018-03-15 DIAGNOSIS — Z6829 Body mass index (BMI) 29.0-29.9, adult: Secondary | ICD-10-CM

## 2018-03-15 DIAGNOSIS — M8580 Other specified disorders of bone density and structure, unspecified site: Secondary | ICD-10-CM | POA: Diagnosis not present

## 2018-03-15 DIAGNOSIS — I10 Essential (primary) hypertension: Secondary | ICD-10-CM | POA: Diagnosis not present

## 2018-03-15 DIAGNOSIS — E7800 Pure hypercholesterolemia, unspecified: Secondary | ICD-10-CM

## 2018-03-15 MED ORDER — HYDROCHLOROTHIAZIDE 25 MG PO TABS: 25.0000 mg | ORAL_TABLET | Freq: Every day | ORAL | 1 refills | Status: DC

## 2018-03-15 MED ORDER — LEVOTHYROXINE SODIUM 100 MCG PO TABS: 100.0000 ug | ORAL_TABLET | Freq: Every day | ORAL | 2 refills | Status: DC

## 2018-03-15 MED ORDER — RALOXIFENE HCL 60 MG PO TABS: 60.0000 mg | ORAL_TABLET | Freq: Every day | ORAL | 1 refills | Status: DC

## 2018-03-15 NOTE — Patient Instructions (Signed)
Fall Prevention in the Home, Adult  Falls can cause injuries. They can happen to people of all ages. There are many things you can do to make your home safe and to help prevent falls. Ask for help when making these changes, if needed.  What actions can I take to prevent falls?  General Instructions  · Use good lighting in all rooms. Replace any light bulbs that burn out.  · Turn on the lights when you go into a dark area. Use night-lights.  · Keep items that you use often in easy-to-reach places. Lower the shelves around your home if necessary.  · Set up your furniture so you have a clear path. Avoid moving your furniture around.  · Do not have throw rugs and other things on the floor that can make you trip.  · Avoid walking on wet floors.  · If any of your floors are uneven, fix them.  · Add color or contrast paint or tape to clearly mark and help you see:  ? Any grab bars or handrails.  ? First and last steps of stairways.  ? Where the edge of each step is.  · If you use a stepladder:  ? Make sure that it is fully opened. Do not climb a closed stepladder.  ? Make sure that both sides of the stepladder are locked into place.  ? Ask someone to hold the stepladder for you while you use it.  · If there are any pets around you, be aware of where they are.  What can I do in the bathroom?         · Keep the floor dry. Clean up any water that spills onto the floor as soon as it happens.  · Remove soap buildup in the tub or shower regularly.  · Use non-skid mats or decals on the floor of the tub or shower.  · Attach bath mats securely with double-sided, non-slip rug tape.  · If you need to sit down in the shower, use a plastic, non-slip stool.  · Install grab bars by the toilet and in the tub and shower. Do not use towel bars as grab bars.  What can I do in the bedroom?  · Make sure that you have a light by your bed that is easy to reach.  · Do not use any sheets or blankets that are too big for your bed. They should  not hang down onto the floor.  · Have a firm chair that has side arms. You can use this for support while you get dressed.  What can I do in the kitchen?  · Clean up any spills right away.  · If you need to reach something above you, use a strong step stool that has a grab bar.  · Keep electrical cords out of the way.  · Do not use floor polish or wax that makes floors slippery. If you must use wax, use non-skid floor wax.  What can I do with my stairs?  · Do not leave any items on the stairs.  · Make sure that you have a light switch at the top of the stairs and the bottom of the stairs. If you do not have them, ask someone to add them for you.  · Make sure that there are handrails on both sides of the stairs, and use them. Fix handrails that are broken or loose. Make sure that handrails are as long as the stairways.  ·   Install non-slip stair treads on all stairs in your home.  · Avoid having throw rugs at the top or bottom of the stairs. If you do have throw rugs, attach them to the floor with carpet tape.  · Choose a carpet that does not hide the edge of the steps on the stairway.  · Check any carpeting to make sure that it is firmly attached to the stairs. Fix any carpet that is loose or worn.  What can I do on the outside of my home?  · Use bright outdoor lighting.  · Regularly fix the edges of walkways and driveways and fix any cracks.  · Remove anything that might make you trip as you walk through a door, such as a raised step or threshold.  · Trim any bushes or trees on the path to your home.  · Regularly check to see if handrails are loose or broken. Make sure that both sides of any steps have handrails.  · Install guardrails along the edges of any raised decks and porches.  · Clear walking paths of anything that might make someone trip, such as tools or rocks.  · Have any leaves, snow, or ice cleared regularly.  · Use sand or salt on walking paths during winter.  · Clean up any spills in your garage right  away. This includes grease or oil spills.  What other actions can I take?  · Wear shoes that:  ? Have a low heel. Do not wear high heels.  ? Have rubber bottoms.  ? Are comfortable and fit you well.  ? Are closed at the toe. Do not wear open-toe sandals.  · Use tools that help you move around (mobility aids) if they are needed. These include:  ? Canes.  ? Walkers.  ? Scooters.  ? Crutches.  · Review your medicines with your doctor. Some medicines can make you feel dizzy. This can increase your chance of falling.  Ask your doctor what other things you can do to help prevent falls.  Where to find more information  · Centers for Disease Control and Prevention, [employer]AD[employer]: https://cdc.gov  · National Institute on Aging: https://go4life.nia.nih.gov  Contact a doctor if:  · You are afraid of falling at home.  · You feel weak, drowsy, or dizzy at home.  · You fall at home.  Summary  · There are many simple things that you can do to make your home safe and to help prevent falls.  · Ways to make your home safe include removing tripping hazards and installing grab bars in the bathroom.  · Ask for help when making these changes in your home.  This information is not intended to replace advice given to you by your health care provider. Make sure you discuss any questions you have with your health care provider.  Document Released: 12/24/2008 Document Revised: 10/12/2016 Document Reviewed: 10/12/2016  Elsevier Interactive Patient Education © 2019 [employer]vier Inc.

## 2018-03-15 NOTE — Progress Notes (Signed)
Subjective:    Patient ID: Nicole Wheeler, female    DOB: 1930-03-25, 85 y.o.   MRN: [id number]   Chief Complaint: Medical Management of Chronic Issues   HPI:  1. Essential hypertension  No c/o chest pain, sob or headache. Does not check blood pressure at home. BP Readings from Last 3 Encounters:  03/15/18 133/65  01/30/18 (!) 144/72  01/23/18 (!) 162/78     2. Hypothyroidism, unspecified type  No problems that aware of  3. Osteopenia, unspecified location  Last dexascan was done 12/29/15 with tscore of -1.2. she does not do much exercise. She is oing to start doing silver sneakers at the [facility]  4. Pure hypercholesterolemia  Does not watch diet  5. BMI 29.0-29.9,adult  no recent weight changes    Outpatient Encounter Medications as of 03/15/2018  Medication Sig  . aspirin EC 81 MG tablet Take 81 mg by mouth daily.  . Cholecalciferol (VITAMIN D) 2000 units CAPS Take 1,000 Units by mouth daily.  . diphenhydramine-acetaminophen (TYLENOL PM) 25-500 MG TABS tablet Take 0.5 tablets by mouth at bedtime as needed.   . dorzolamide-timolol (COSOPT) 22.3-6.8 MG/ML ophthalmic solution   . fish oil-omega-3 fatty acids 1000 MG capsule Take 1 capsule by mouth daily.  . hydrochlorothiazide (HYDRODIURIL) 25 MG tablet Take 1 tablet (25 mg total) by mouth daily.  Marland Kitchen latanoprost (XALATAN) 0.005 % ophthalmic solution   . levothyroxine (SYNTHROID, LEVOTHROID) 100 MCG tablet Take 1 tablet (100 mcg total) by mouth daily.  . Multiple Vitamins-Minerals (CENTRUM SILVER PO) Take by mouth.  . Multiple Vitamins-Minerals (PRESERVISION/LUTEIN PO) Take by mouth.  . naproxen sodium (ANAPROX) 220 MG tablet Take 220 mg by mouth daily. Takes at night time daily   . raloxifene (EVISTA) 60 MG tablet Take 1 tablet (60 mg total) by mouth daily.       New complaints: None today  Social history: Lives by herself. Family checks on her daily   Review of Systems  Constitutional: Negative for activity change  and appetite change.  HENT: Negative.   Eyes: Negative for pain.  Respiratory: Negative for shortness of breath.   Cardiovascular: Negative for chest pain, palpitations and leg swelling.  Gastrointestinal: Negative for abdominal pain.  Endocrine: Negative for polydipsia.  Genitourinary: Negative.   Skin: Negative for rash.  Neurological: Negative for dizziness, weakness and headaches.  Hematological: Does not bruise/bleed easily.  Psychiatric/Behavioral: Negative.   All other systems reviewed and are negative.      Objective:   Physical Exam Vitals signs and nursing note reviewed.  Constitutional:      General: She is not in acute distress.    Appearance: Normal appearance. She is well-developed.  HENT:     Head: Normocephalic.     Nose: Nose normal.  Eyes:     Pupils: Pupils are equal, round, and reactive to light.  Neck:     Musculoskeletal: Normal range of motion and neck supple.     Vascular: No carotid bruit or JVD.  Cardiovascular:     Rate and Rhythm: Normal rate and regular rhythm.     Heart sounds: Normal heart sounds.  Pulmonary:     Effort: Pulmonary effort is normal. No respiratory distress.     Breath sounds: Normal breath sounds. No wheezing or rales.  Chest:     Chest wall: No tenderness.  Abdominal:     General: Bowel sounds are normal. There is no distension or abdominal bruit.     Palpations: Abdomen is  soft. There is no hepatomegaly, splenomegaly, mass or pulsatile mass.     Tenderness: There is no abdominal tenderness.  Musculoskeletal: Normal range of motion.  Lymphadenopathy:     Cervical: No cervical adenopathy.  Skin:    General: Skin is warm and dry.  Neurological:     Mental Status: She is alert and oriented to person, place, and time.     Deep Tendon Reflexes: Reflexes are normal and symmetric.  Psychiatric:        Behavior: Behavior normal.        Thought Content: Thought content normal.        Judgment: Judgment normal.     BP  133/65   Pulse 67   Temp (!) 97.1 F (36.2 C) (Oral)   Ht 5' (1.524 m)   Wt 162 lb (73.5 kg)   BMI 31.64 kg/m        Assessment & Plan:  SUDA FORBESS comes in today with chief complaint of Medical Management of Chronic Issues   Diagnosis and orders addressed:  1. Essential hypertension Low sodium diet - hydrochlorothiazide (HYDRODIURIL) 25 MG tablet; Take 1 tablet (25 mg total) by mouth daily.  Dispense: 90 tablet; Refill: 1 - CMP14+EGFR  2. Hypothyroidism, unspecified type - levothyroxine (SYNTHROID, LEVOTHROID) 100 MCG tablet; Take 1 tablet (100 mcg total) by mouth daily.  Dispense: 90 tablet; Refill: 2 - Thyroid Panel With TSH  3. Osteopenia, unspecified location Weight bearing exercising - raloxifene (EVISTA) 60 MG tablet; Take 1 tablet (60 mg total) by mouth daily.  Dispense: 90 tablet; Refill: 1  4. Pure hypercholesterolemia Low fat diet - Lipid panel  5. BMI 29.0-29.9,adult Discussed diet and exercise for person with BMI >25 Will recheck weight in 3-6 months   Labs pending Health Maintenance reviewed Diet and exercise encouraged  Follow up plan: 3 months   Mary-Margaret Hassell Done, FNP

## 2018-03-16 LAB — LIPID PANEL
CHOL/HDL RATIO: 4.3 ratio (ref 0.0–4.4)
CHOLESTEROL TOTAL: 200 mg/dL — AB (ref 100–199)
HDL: 47 mg/dL (ref >39)
LDL Calculated: 124 mg/dL — ABNORMAL HIGH (ref 0–99)
TRIGLYCERIDES: 147 mg/dL (ref 0–149)
VLDL CHOLESTEROL CAL: 29 mg/dL (ref 5–40)

## 2018-03-16 LAB — CMP14+EGFR
ALK PHOS: 86 IU/L (ref 39–117)
ALT: 19 IU/L (ref 0–32)
AST: 25 IU/L (ref 0–40)
Albumin/Globulin Ratio: 1.9 (ref 1.2–2.2)
Albumin: 4.2 g/dL (ref 3.5–4.7)
BUN/Creatinine Ratio: 27 (ref 12–28)
BUN: 28 mg/dL — AB (ref 8–27)
Bilirubin Total: 0.2 mg/dL (ref 0.0–1.2)
CHLORIDE: 102 mmol/L (ref 96–106)
CO2: 26 mmol/L (ref 20–29)
Calcium: 9.3 mg/dL (ref 8.7–10.3)
Creatinine, Ser: 1.03 mg/dL — ABNORMAL HIGH (ref 0.57–1.00)
GFR, EST AFRICAN AMERICAN: 57 mL/min/{1.73_m2} — AB (ref >59)
GFR, EST NON AFRICAN AMERICAN: 50 mL/min/{1.73_m2} — AB (ref >59)
GLOBULIN, TOTAL: 2.2 g/dL (ref 1.5–4.5)
Glucose: 97 mg/dL (ref 65–99)
POTASSIUM: 3.9 mmol/L (ref 3.5–5.2)
SODIUM: 143 mmol/L (ref 134–144)
TOTAL PROTEIN: 6.4 g/dL (ref 6.0–8.5)

## 2018-03-16 LAB — THYROID PANEL WITH TSH
Free Thyroxine Index: 2.5 (ref 1.2–4.9)
T3 Uptake Ratio: 31 % (ref 24–39)
T4, Total: 8.1 ug/dL (ref 4.5–12.0)
TSH: 0.802 u[IU]/mL (ref 0.450–4.500)

## 2018-04-03 DIAGNOSIS — H401132 Primary open-angle glaucoma, bilateral, moderate stage: Secondary | ICD-10-CM | POA: Diagnosis not present

## 2018-04-22 ENCOUNTER — Other Ambulatory Visit: Payer: Self-pay | Admitting: Family Medicine

## 2018-04-22 DIAGNOSIS — Z1231 Encounter for screening mammogram for malignant neoplasm of breast: Secondary | ICD-10-CM

## 2018-06-07 ENCOUNTER — Ambulatory Visit: Payer: Medicare Other

## 2018-07-16 ENCOUNTER — Ambulatory Visit: Payer: Medicare Other

## 2018-07-30 ENCOUNTER — Other Ambulatory Visit: Payer: Self-pay

## 2018-07-31 ENCOUNTER — Ambulatory Visit (INDEPENDENT_AMBULATORY_CARE_PROVIDER_SITE_OTHER): Payer: Medicare HMO | Admitting: Nurse Practitioner

## 2018-07-31 ENCOUNTER — Encounter: Payer: Self-pay | Admitting: Nurse Practitioner

## 2018-07-31 VITALS — BP 138/74 | HR 63 | Temp 97.4°F | Ht 60.0 in | Wt 162.0 lb

## 2018-07-31 DIAGNOSIS — E7800 Pure hypercholesterolemia, unspecified: Secondary | ICD-10-CM

## 2018-07-31 DIAGNOSIS — E039 Hypothyroidism, unspecified: Secondary | ICD-10-CM

## 2018-07-31 DIAGNOSIS — M8580 Other specified disorders of bone density and structure, unspecified site: Secondary | ICD-10-CM

## 2018-07-31 DIAGNOSIS — I10 Essential (primary) hypertension: Secondary | ICD-10-CM

## 2018-07-31 DIAGNOSIS — Z6829 Body mass index (BMI) 29.0-29.9, adult: Secondary | ICD-10-CM | POA: Diagnosis not present

## 2018-07-31 LAB — CMP14+EGFR
ALT: 16 IU/L (ref 0–32)
AST: 22 IU/L (ref 0–40)
Albumin/Globulin Ratio: 1.8 (ref 1.2–2.2)
Albumin: 4 g/dL (ref 3.6–4.6)
Alkaline Phosphatase: 84 IU/L (ref 39–117)
BUN/Creatinine Ratio: 24 (ref 12–28)
BUN: 28 mg/dL — ABNORMAL HIGH (ref 8–27)
Bilirubin Total: 0.2 mg/dL (ref 0.0–1.2)
CO2: 26 mmol/L (ref 20–29)
Calcium: 9.6 mg/dL (ref 8.7–10.3)
Chloride: 102 mmol/L (ref 96–106)
Creatinine, Ser: 1.15 mg/dL — ABNORMAL HIGH (ref 0.57–1.00)
GFR calc Af Amer: 50 mL/min/{1.73_m2} — ABNORMAL LOW (ref >59)
GFR calc non Af Amer: 43 mL/min/{1.73_m2} — ABNORMAL LOW (ref >59)
Globulin, Total: 2.2 g/dL (ref 1.5–4.5)
Glucose: 76 mg/dL (ref 65–99)
Potassium: 4.6 mmol/L (ref 3.5–5.2)
Sodium: 140 mmol/L (ref 134–144)
Total Protein: 6.2 g/dL (ref 6.0–8.5)

## 2018-07-31 LAB — LIPID PANEL
Chol/HDL Ratio: 4.4 ratio (ref 0.0–4.4)
Cholesterol, Total: 191 mg/dL (ref 100–199)
HDL: 43 mg/dL (ref >39)
LDL Calculated: 126 mg/dL — ABNORMAL HIGH (ref 0–99)
Triglycerides: 109 mg/dL (ref 0–149)
VLDL Cholesterol Cal: 22 mg/dL (ref 5–40)

## 2018-07-31 MED ORDER — RALOXIFENE HCL 60 MG PO TABS: 60.0000 mg | ORAL_TABLET | Freq: Every day | ORAL | 1 refills | Status: DC

## 2018-07-31 MED ORDER — LEVOTHYROXINE SODIUM 100 MCG PO TABS: 100.0000 ug | ORAL_TABLET | Freq: Every day | ORAL | 2 refills | Status: DC

## 2018-07-31 MED ORDER — HYDROCHLOROTHIAZIDE 25 MG PO TABS: 25.0000 mg | ORAL_TABLET | Freq: Every day | ORAL | 1 refills | Status: DC

## 2018-07-31 NOTE — Progress Notes (Signed)
Subjective:    Patient ID: Nicole Wheeler, female    DOB: 1930/08/22, 85 y.o.   MRN: [id number]   Chief Complaint: Medical Management of Chronic Issues (Swelling to left foot in last month at night time)    HPI:  1. Essential hypertension No c/o chest pain, sob or headache. Does not check blood pressure at home. BP Readings from Last 3 Encounters:  07/31/18 138/74  03/15/18 133/65  01/30/18 (!) 144/72     2. Pure hypercholesterolemia Tries to watch diet but does very little exercsie.  3. Hypothyroidism, unspecified type No problems that she is aware of.  4. Osteopenia, unspecified location Last dexascan was done 12/29/15 with t score of -1.5. she has aged out of bone density in the future.  5. BMI 29.0-29.9,adult No recent weight changes    Outpatient Encounter Medications as of 07/31/2018  Medication Sig  . aspirin EC 81 MG tablet Take 81 mg by mouth daily.  . Cholecalciferol (VITAMIN D) 2000 units CAPS Take 1,000 Units by mouth daily.  . diphenhydramine-acetaminophen (TYLENOL PM) 25-500 MG TABS tablet Take 0.5 tablets by mouth at bedtime as needed.   . dorzolamide-timolol (COSOPT) 22.3-6.8 MG/ML ophthalmic solution   . fish oil-omega-3 fatty acids 1000 MG capsule Take 1 capsule by mouth daily.  . hydrochlorothiazide (HYDRODIURIL) 25 MG tablet Take 1 tablet (25 mg total) by mouth daily.  Marland Kitchen latanoprost (XALATAN) 0.005 % ophthalmic solution   . levothyroxine (SYNTHROID, LEVOTHROID) 100 MCG tablet Take 1 tablet (100 mcg total) by mouth daily.  . Multiple Vitamins-Minerals (CENTRUM SILVER PO) Take by mouth.  . Multiple Vitamins-Minerals (PRESERVISION/LUTEIN PO) Take by mouth.  . naproxen sodium (ANAPROX) 220 MG tablet Take 220 mg by mouth daily. Takes at night time daily   . raloxifene (EVISTA) 60 MG tablet Take 1 tablet (60 mg total) by mouth daily.       New complaints: None today  Social history: Lives alone- her family checks on her daily.     Review of  Systems  Constitutional: Negative for activity change and appetite change.  HENT: Negative.   Eyes: Negative for pain.  Respiratory: Negative for shortness of breath.   Cardiovascular: Negative for chest pain, palpitations and leg swelling.  Gastrointestinal: Negative for abdominal pain.  Endocrine: Negative for polydipsia.  Genitourinary: Negative.   Skin: Negative for rash.  Neurological: Negative for dizziness, weakness and headaches.  Hematological: Does not bruise/bleed easily.  Psychiatric/Behavioral: Negative.   All other systems reviewed and are negative.      Objective:   Physical Exam Vitals signs and nursing note reviewed.  Constitutional:      General: She is not in acute distress.    Appearance: Normal appearance. She is well-developed.  HENT:     Head: Normocephalic.     Nose: Nose normal.  Eyes:     Pupils: Pupils are equal, round, and reactive to light.  Neck:     Musculoskeletal: Normal range of motion and neck supple.     Vascular: No carotid bruit or JVD.  Cardiovascular:     Rate and Rhythm: Normal rate and regular rhythm.     Heart sounds: Normal heart sounds.  Pulmonary:     Effort: Pulmonary effort is normal. No respiratory distress.     Breath sounds: Normal breath sounds. No wheezing or rales.  Chest:     Chest wall: No tenderness.  Abdominal:     General: Bowel sounds are normal. There is no distension or abdominal  bruit.     Palpations: Abdomen is soft. There is no hepatomegaly, splenomegaly, mass or pulsatile mass.     Tenderness: There is no abdominal tenderness.  Musculoskeletal: Normal range of motion.     Right lower leg: Edema (1+) present.     Left lower leg: Edema (1+) present.  Lymphadenopathy:     Cervical: No cervical adenopathy.  Skin:    General: Skin is warm and dry.  Neurological:     Mental Status: She is alert and oriented to person, place, and time.     Deep Tendon Reflexes: Reflexes are normal and symmetric.   Psychiatric:        Behavior: Behavior normal.        Thought Content: Thought content normal.        Judgment: Judgment normal.    BP 138/74   Pulse 63   Temp (!) 97.4 F (36.3 C) (Oral)   Ht 5' (1.524 m)   Wt 162 lb (73.5 kg)   BMI 31.64 kg/m         Assessment & Plan:  Nicole Wheeler comes in today with chief complaint of Medical Management of Chronic Issues (Swelling to left foot in last month at night time)   Diagnosis and orders addressed:  1. Essential hypertension Low sodium diet - hydrochlorothiazide (HYDRODIURIL) 25 MG tablet; Take 1 tablet (25 mg total) by mouth daily.  Dispense: 90 tablet; Refill: 1 - CMP14+EGFR  2. Pure hypercholesterolemia Low fat diet - Lipid panel  3. Hypothyroidism, unspecified type - levothyroxine (SYNTHROID) 100 MCG tablet; Take 1 tablet (100 mcg total) by mouth daily.  Dispense: 90 tablet; Refill: 2  4. Osteopenia, unspecified location Weight bearing exercises - raloxifene (EVISTA) 60 MG tablet; Take 1 tablet (60 mg total) by mouth daily.  Dispense: 90 tablet; Refill: 1  5. BMI 29.0-29.9,adult Discussed diet and exercise for person with BMI >25 Will recheck weight in 3-6 months   Labs pending Health Maintenance reviewed Diet and exercise encouraged  Follow up plan: 3 months   Mary-Margaret Hassell Done, FNP

## 2018-08-21 DIAGNOSIS — H401132 Primary open-angle glaucoma, bilateral, moderate stage: Secondary | ICD-10-CM | POA: Diagnosis not present

## 2018-08-21 DIAGNOSIS — H02834 Dermatochalasis of left upper eyelid: Secondary | ICD-10-CM | POA: Diagnosis not present

## 2018-08-21 DIAGNOSIS — Z961 Presence of intraocular lens: Secondary | ICD-10-CM | POA: Diagnosis not present

## 2018-08-21 DIAGNOSIS — H02831 Dermatochalasis of right upper eyelid: Secondary | ICD-10-CM | POA: Diagnosis not present

## 2018-08-21 DIAGNOSIS — H353132 Nonexudative age-related macular degeneration, bilateral, intermediate dry stage: Secondary | ICD-10-CM | POA: Diagnosis not present

## 2018-09-09 ENCOUNTER — Other Ambulatory Visit: Payer: Self-pay

## 2018-09-09 ENCOUNTER — Ambulatory Visit
Admission: RE | Admit: 2018-09-09 | Discharge: 2018-09-09 | Disposition: A | Discharge status: Home / Self Care | Payer: Medicare HMO | Source: Ambulatory Visit | Attending: Family Medicine | Admitting: Family Medicine

## 2018-09-09 DIAGNOSIS — Z1231 Encounter for screening mammogram for malignant neoplasm of breast: Secondary | ICD-10-CM | POA: Diagnosis not present

## 2018-10-14 DIAGNOSIS — H401132 Primary open-angle glaucoma, bilateral, moderate stage: Secondary | ICD-10-CM | POA: Diagnosis not present

## 2018-10-21 ENCOUNTER — Telehealth: Payer: Self-pay | Admitting: Nurse Practitioner

## 2018-10-21 NOTE — Chronic Care Management (AMB) (Signed)
°  Chronic Care Management   Outreach Note  10/21/2018 Name: Nicole Wheeler MRN: [id number] DOB: [birth year]  Referred by: Chevis Pretty, FNP Reason for referral : Chronic Care Management (Initial CCM outreach was unsuccessful. )   An unsuccessful telephone outreach was attempted today. The patient was referred to the case management team by for assistance with chronic care management and care coordination.   Follow Up Plan: The care management team will reach out to the patient again over the next 7 days.   [place]  [email or web address]   ??[phone]

## 2018-10-29 NOTE — Chronic Care Management (AMB) (Signed)
Chronic Care Management   Note  10/29/2018 Name: Nicole Wheeler MRN: [id number] DOB: Jan 07, 1931  Nicole Wheeler is a 85 y.o. year old female who is a primary care patient of Chevis Pretty, [place]. I reached out to [name] by phone today in response to a referral sent by [name]'s health plan.    [name] was given information about Chronic Care Management services today including:  1. CCM service includes personalized support from designated clinical staff supervised by her physician, including individualized plan of care and coordination with other care providers 2. 24/7 contact phone numbers for assistance for urgent and routine care needs. 3. Service will only be billed when office clinical staff spend 20 minutes or more in a month to coordinate care. 4. Only one practitioner may furnish and bill the service in a calendar month. 5. The patient may stop CCM services at any time (effective at the end of the month) by phone call to the office staff. 6. The patient will be responsible for cost sharing (co-pay) of up to 20% of the service fee (after annual deductible is met).  Patient did not agree to enrollment in care management services and does not wish to consider at this time.  Follow up plan: The patient has been provided with contact information for the chronic care management team and has been advised to call with any health related questions or concerns.   [place]  [email or web address]   ??[phone]

## 2018-11-04 DIAGNOSIS — H401132 Primary open-angle glaucoma, bilateral, moderate stage: Secondary | ICD-10-CM | POA: Diagnosis not present

## 2018-12-06 ENCOUNTER — Telehealth: Payer: Medicare HMO

## 2019-01-08 DIAGNOSIS — H401132 Primary open-angle glaucoma, bilateral, moderate stage: Secondary | ICD-10-CM | POA: Diagnosis not present

## 2019-03-24 ENCOUNTER — Encounter: Payer: Self-pay | Admitting: Nurse Practitioner

## 2019-03-24 ENCOUNTER — Ambulatory Visit (INDEPENDENT_AMBULATORY_CARE_PROVIDER_SITE_OTHER): Payer: Medicare HMO | Admitting: Nurse Practitioner

## 2019-03-24 ENCOUNTER — Other Ambulatory Visit: Payer: Self-pay

## 2019-03-24 ENCOUNTER — Ambulatory Visit (INDEPENDENT_AMBULATORY_CARE_PROVIDER_SITE_OTHER): Payer: Medicare HMO

## 2019-03-24 VITALS — BP 158/74 | HR 67 | Temp 98.0°F | Resp 20 | Ht 60.0 in | Wt 164.0 lb

## 2019-03-24 DIAGNOSIS — E039 Hypothyroidism, unspecified: Secondary | ICD-10-CM | POA: Diagnosis not present

## 2019-03-24 DIAGNOSIS — I10 Essential (primary) hypertension: Secondary | ICD-10-CM | POA: Diagnosis not present

## 2019-03-24 DIAGNOSIS — E7800 Pure hypercholesterolemia, unspecified: Secondary | ICD-10-CM | POA: Diagnosis not present

## 2019-03-24 DIAGNOSIS — M8580 Other specified disorders of bone density and structure, unspecified site: Secondary | ICD-10-CM

## 2019-03-24 DIAGNOSIS — Z6829 Body mass index (BMI) 29.0-29.9, adult: Secondary | ICD-10-CM

## 2019-03-24 DIAGNOSIS — M25562 Pain in left knee: Secondary | ICD-10-CM | POA: Diagnosis not present

## 2019-03-24 DIAGNOSIS — M1712 Unilateral primary osteoarthritis, left knee: Secondary | ICD-10-CM | POA: Diagnosis not present

## 2019-03-24 DIAGNOSIS — M25462 Effusion, left knee: Secondary | ICD-10-CM | POA: Diagnosis not present

## 2019-03-24 MED ORDER — LISINOPRIL 20 MG PO TABS: 20.0000 mg | ORAL_TABLET | Freq: Every day | ORAL | 1 refills | Status: DC

## 2019-03-24 MED ORDER — RALOXIFENE HCL 60 MG PO TABS: 60.0000 mg | ORAL_TABLET | Freq: Every day | ORAL | 1 refills | Status: DC

## 2019-03-24 MED ORDER — MELOXICAM 15 MG PO TABS: 15.0000 mg | ORAL_TABLET | Freq: Every day | ORAL | 0 refills | Status: DC

## 2019-03-24 MED ORDER — LEVOTHYROXINE SODIUM 100 MCG PO TABS: 100.0000 ug | ORAL_TABLET | Freq: Every day | ORAL | 2 refills | Status: DC

## 2019-03-24 MED ORDER — HYDROCHLOROTHIAZIDE 25 MG PO TABS: 25.0000 mg | ORAL_TABLET | Freq: Every day | ORAL | 1 refills | Status: DC

## 2019-03-24 NOTE — Progress Notes (Signed)
Subjective:    Patient ID: Nicole Wheeler, female    DOB: Feb 02, 1931, 86 y.o.   MRN: [id number]   Chief Complaint: Medical Management of Chronic Issues    HPI:  1. Essential hypertension No c/o chest pain, sob or headache. Does  check blood pressure at home. Blood pressure was 696'V sysyolic before taking her meds this morning. BP Readings from Last 3 Encounters:  03/24/19 (!) 158/74  07/31/18 138/74  03/15/18 133/65     2. Pure hypercholesterolemia Does try to watch diet but does very little exercise. Lab Results  Component Value Date   CHOL 191 07/31/2018   HDL 43 07/31/2018   LDLCALC 126 (H) 07/31/2018   TRIG 109 07/31/2018   CHOLHDL 4.4 07/31/2018     3. Hypothyroidism, unspecified type No problems that aware of  4. Osteopenia, unspecified location deniess back pain. Is aged out of dexascans  5. BMI 29.0-29.9,adult No recent weight changes Wt Readings from Last 3 Encounters:  03/24/19 164 lb (74.4 kg)  07/31/18 162 lb (73.5 kg)  03/15/18 162 lb (73.5 kg)   BMI Readings from Last 3 Encounters:  03/24/19 32.03 kg/m  07/31/18 31.64 kg/m  03/15/18 31.64 kg/m       Outpatient Encounter Medications as of 03/24/2019  Medication Sig  . aspirin EC 81 MG tablet Take 81 mg by mouth daily.  . Cholecalciferol (VITAMIN D) 2000 units CAPS Take 1,000 Units by mouth daily.  . diphenhydramine-acetaminophen (TYLENOL PM) 25-500 MG TABS tablet Take 0.5 tablets by mouth at bedtime as needed.   . dorzolamide-timolol (COSOPT) 22.3-6.8 MG/ML ophthalmic solution   . fish oil-omega-3 fatty acids 1000 MG capsule Take 1 capsule by mouth daily.  . hydrochlorothiazide (HYDRODIURIL) 25 MG tablet Take 1 tablet (25 mg total) by mouth daily.  Marland Kitchen latanoprost (XALATAN) 0.005 % ophthalmic solution   . levothyroxine (SYNTHROID) 100 MCG tablet Take 1 tablet (100 mcg total) by mouth daily.  . Multiple Vitamins-Minerals (CENTRUM SILVER PO) Take by mouth.  . Multiple Vitamins-Minerals  (PRESERVISION/LUTEIN PO) Take by mouth.  . naproxen sodium (ANAPROX) 220 MG tablet Take 220 mg by mouth daily. Takes at night time daily   . raloxifene (EVISTA) 60 MG tablet Take 1 tablet (60 mg total) by mouth daily.     Past Surgical History:  Procedure Laterality Date  . APPENDECTOMY    . EYE SURGERY Right    cataract removal  . LUMBAR LAMINECTOMY/DECOMPRESSION MICRODISCECTOMY  12/07/2011   Procedure: LUMBAR LAMINECTOMY/DECOMPRESSION MICRODISCECTOMY 1 LEVEL;  Surgeon: Ophelia Charter, MD;  Location: [facility];  Service: Neurosurgery;  Laterality: Right;  Right Lumbar Five-Sacral One Diskectomy  . throidectomy    . TUBAL LIGATION      Family History  Problem Relation Age of Onset  . Stroke Mother 39  . Hypertension Mother   . Stroke Father 82  . Hypertension Father   . Heart attack Sister 4       heart attack  . Breast cancer Maternal Aunt   . Healthy Daughter   . Healthy Son   . Healthy Son     New complaints: *patient c/o of left knee pain. Pain is mainly in the back of her knee. Has been waking her up at night. Some mornings she cn hardly walk on it. She has used Biofreeze and heat which helps some.  Social history: Lives by herself. Family checks on her daily  Controlled substance contract: n/a    Review of Systems  Constitutional: Negative  for diaphoresis.  Eyes: Negative for pain.  Respiratory: Negative for shortness of breath.   Cardiovascular: Negative for chest pain, palpitations and leg swelling.  Gastrointestinal: Negative for abdominal pain.  Endocrine: Negative for polydipsia.  Musculoskeletal: Positive for arthralgias (left knee pain).  Skin: Negative for rash.  Neurological: Negative for dizziness, weakness and headaches.  Hematological: Does not bruise/bleed easily.  All other systems reviewed and are negative.      Objective:   Physical Exam Vitals and nursing note reviewed.  Constitutional:      General: She is not in acute  distress.    Appearance: Normal appearance. She is well-developed.  HENT:     Head: Normocephalic.     Nose: Nose normal.  Eyes:     Pupils: Pupils are equal, round, and reactive to light.  Neck:     Vascular: No carotid bruit or JVD.  Cardiovascular:     Rate and Rhythm: Normal rate and regular rhythm.     Heart sounds: Normal heart sounds.  Pulmonary:     Effort: Pulmonary effort is normal. No respiratory distress.     Breath sounds: Normal breath sounds. No wheezing or rales.  Chest:     Chest wall: No tenderness.  Abdominal:     General: Bowel sounds are normal. There is no distension or abdominal bruit.     Palpations: Abdomen is soft. There is no hepatomegaly, splenomegaly, mass or pulsatile mass.     Tenderness: There is no abdominal tenderness.  Musculoskeletal:        General: Normal range of motion.     Cervical back: Normal range of motion and neck supple.     Comments: Gait slow and steady Mild effusion of left knee  No patella tenderness All ligaments intact FROM with pain on full extension posteriorly  Lymphadenopathy:     Cervical: No cervical adenopathy.  Skin:    General: Skin is warm and dry.  Neurological:     Mental Status: She is alert and oriented to person, place, and time.     Deep Tendon Reflexes: Reflexes are normal and symmetric.  Psychiatric:        Behavior: Behavior normal.        Thought Content: Thought content normal.        Judgment: Judgment normal.     BP (!) 158/74   Pulse 67   Temp 98 F (36.7 C) (Temporal)   Resp 20   Ht 5' (1.524 m)   Wt 164 lb (74.4 kg)   SpO2 96%   BMI 32.03 kg/m   Knee xray- osteoarthrthritis on bil knees- possible bone fragment on medial side of left knee joint. Preliminary reading by Ronnald Collum, FNP  [facility]      Assessment & Plan:  Nicole Wheeler comes in today with chief complaint of Medical Management of Chronic Issues   Diagnosis and orders addressed:  1. Essential hypertension Low sodium  diet - hydrochlorothiazide (HYDRODIURIL) 25 MG tablet; Take 1 tablet (25 mg total) by mouth daily.  Dispense: 90 tablet; Refill: 1 - CMP14+EGFR - lisinopril (ZESTRIL) 20 MG tablet; Take 1 tablet (20 mg total) by mouth daily.  Dispense: 90 tablet; Refill: 1  2. Pure hypercholesterolemia Low fat diet - Lipid panel  3. Hypothyroidism, unspecified type - levothyroxine (SYNTHROID) 100 MCG tablet; Take 1 tablet (100 mcg total) by mouth daily.  Dispense: 90 tablet; Refill: 2  4. Osteopenia, unspecified location Weight bearing exercises when can tolerate - raloxifene (EVISTA)  60 MG tablet; Take 1 tablet (60 mg total) by mouth daily.  Dispense: 90 tablet; Refill: 1  5. BMI 29.0-29.9,adult Discussed diet and exercise for person with BMI >25 Will recheck weight in 3-6 months  6. Acute pain of left knee Rest Moist heat if helps - DG Knee 1-2 Views Left; Future - meloxicam (MOBIC) 15 MG tablet; Take 1 tablet (15 mg total) by mouth daily.  Dispense: 30 tablet; Refill: 0   Labs pending Health Maintenance reviewed Diet and exercise encouraged  Follow up plan: 3 months   Mary-Margaret Hassell Done, FNP

## 2019-03-24 NOTE — Patient Instructions (Signed)

## 2019-03-25 LAB — LIPID PANEL
Chol/HDL Ratio: 4.3 ratio (ref 0.0–4.4)
Cholesterol, Total: 192 mg/dL (ref 100–199)
HDL: 45 mg/dL (ref >39)
LDL Chol Calc (NIH): 125 mg/dL — ABNORMAL HIGH (ref 0–99)
Triglycerides: 125 mg/dL (ref 0–149)
VLDL Cholesterol Cal: 22 mg/dL (ref 5–40)

## 2019-03-25 LAB — CMP14+EGFR
ALT: 19 IU/L (ref 0–32)
AST: 22 IU/L (ref 0–40)
Albumin/Globulin Ratio: 1.8 (ref 1.2–2.2)
Albumin: 3.9 g/dL (ref 3.6–4.6)
Alkaline Phosphatase: 84 IU/L (ref 39–117)
BUN/Creatinine Ratio: 21 (ref 12–28)
BUN: 26 mg/dL (ref 8–27)
Bilirubin Total: <0.2 mg/dL (ref 0.0–1.2)
CO2: 26 mmol/L (ref 20–29)
Calcium: 9.4 mg/dL (ref 8.7–10.3)
Chloride: 101 mmol/L (ref 96–106)
Creatinine, Ser: 1.23 mg/dL — ABNORMAL HIGH (ref 0.57–1.00)
GFR calc Af Amer: 46 mL/min/{1.73_m2} — ABNORMAL LOW (ref >59)
GFR calc non Af Amer: 40 mL/min/{1.73_m2} — ABNORMAL LOW (ref >59)
Globulin, Total: 2.2 g/dL (ref 1.5–4.5)
Glucose: 101 mg/dL — ABNORMAL HIGH (ref 65–99)
Potassium: 4.3 mmol/L (ref 3.5–5.2)
Sodium: 140 mmol/L (ref 134–144)
Total Protein: 6.1 g/dL (ref 6.0–8.5)

## 2019-05-16 ENCOUNTER — Other Ambulatory Visit: Payer: Self-pay | Admitting: Physician Assistant

## 2019-05-16 DIAGNOSIS — D485 Neoplasm of uncertain behavior of skin: Secondary | ICD-10-CM | POA: Diagnosis not present

## 2019-05-16 DIAGNOSIS — L570 Actinic keratosis: Secondary | ICD-10-CM | POA: Diagnosis not present

## 2019-05-19 ENCOUNTER — Telehealth: Payer: Self-pay | Admitting: Nurse Practitioner

## 2019-05-19 MED ORDER — LOSARTAN POTASSIUM 100 MG PO TABS: 100.0000 mg | ORAL_TABLET | Freq: Every day | ORAL | 3 refills | Status: DC

## 2019-05-19 NOTE — Telephone Encounter (Signed)
Pt aware and voiced understanding 

## 2019-05-19 NOTE — Telephone Encounter (Signed)
Please advise if any side effects patient should look for.

## 2019-05-19 NOTE — Telephone Encounter (Signed)
Should not cause any side effects

## 2019-05-19 NOTE — Telephone Encounter (Signed)
Stop lisinopril- changed to losartan 100mg  daily- rx sent to pharmacy

## 2019-05-19 NOTE — Telephone Encounter (Signed)
Patient states she has been on her lisinopril x 6 weeks and she has had a cough and would like to switch to another medication.  Requesting a 30 day rx sent into [facility].  Would also to know about the side effects to expect on the new medication. BP reading below.  3/1- 147/41 3/2- 157/67 & 132/74 3/3- 151/70 3/8- 136/69  Please advise

## 2019-05-23 ENCOUNTER — Telehealth: Payer: Self-pay | Admitting: Nurse Practitioner

## 2019-05-23 NOTE — Chronic Care Management (AMB) (Signed)
  Chronic Care Management   Note  05/23/2019 Name: Nicole Wheeler MRN: [id number] DOB: June 21, 1930  [name] is a 86 y.o. year old female who is a primary care patient of Chevis Pretty, [place]. I reached out to [name] by phone today in response to a referral sent by [name]'s health plan.     [name] was given information about Chronic Care Management services today including:  1. CCM service includes personalized support from designated clinical staff supervised by her physician, including individualized plan of care and coordination with other care providers 2. 24/7 contact phone numbers for assistance for urgent and routine care needs. 3. Service will only be billed when office clinical staff spend 20 minutes or more in a month to coordinate care. 4. Only one practitioner may furnish and bill the service in a calendar month. 5. The patient may stop CCM services at any time (effective at the end of the month) by phone call to the office staff. 6. The patient will be responsible for cost sharing (co-pay) of up to 20% of the service fee (after annual deductible is met).  Patient agreed to services and verbal consent obtained.   Follow up plan: Telephone appointment with care management team member scheduled for: 09/10/2019.  [place], [place] [id number] Direct Dial: [phone] [email or web address]

## 2019-06-10 DIAGNOSIS — M1712 Unilateral primary osteoarthritis, left knee: Secondary | ICD-10-CM | POA: Diagnosis not present

## 2019-06-10 DIAGNOSIS — M25562 Pain in left knee: Secondary | ICD-10-CM | POA: Diagnosis not present

## 2019-06-24 ENCOUNTER — Encounter: Payer: Self-pay | Admitting: Nurse Practitioner

## 2019-06-24 ENCOUNTER — Ambulatory Visit (INDEPENDENT_AMBULATORY_CARE_PROVIDER_SITE_OTHER): Payer: Medicare HMO | Admitting: Nurse Practitioner

## 2019-06-24 DIAGNOSIS — I10 Essential (primary) hypertension: Secondary | ICD-10-CM

## 2019-06-24 DIAGNOSIS — E7800 Pure hypercholesterolemia, unspecified: Secondary | ICD-10-CM

## 2019-06-24 DIAGNOSIS — Z6829 Body mass index (BMI) 29.0-29.9, adult: Secondary | ICD-10-CM

## 2019-06-24 DIAGNOSIS — M8580 Other specified disorders of bone density and structure, unspecified site: Secondary | ICD-10-CM | POA: Diagnosis not present

## 2019-06-24 DIAGNOSIS — E039 Hypothyroidism, unspecified: Secondary | ICD-10-CM | POA: Diagnosis not present

## 2019-06-24 MED ORDER — LOSARTAN POTASSIUM 100 MG PO TABS: 100.0000 mg | ORAL_TABLET | Freq: Every day | ORAL | 1 refills | Status: DC

## 2019-06-24 MED ORDER — HYDROCHLOROTHIAZIDE 25 MG PO TABS: 25.0000 mg | ORAL_TABLET | Freq: Every day | ORAL | 1 refills | Status: DC

## 2019-06-24 MED ORDER — LEVOTHYROXINE SODIUM 100 MCG PO TABS: 100.0000 ug | ORAL_TABLET | Freq: Every day | ORAL | 1 refills | Status: DC

## 2019-06-24 NOTE — Progress Notes (Signed)
Virtual Visit via telephone Note Due to COVID-19 pandemic this visit was conducted virtually. This visit type was conducted due to national recommendations for restrictions regarding the COVID-19 Pandemic (e.g. social distancing, sheltering in place) in an effort to limit this patient's exposure and mitigate transmission in our community. All issues noted in this document were discussed and addressed.  A physical exam was not performed with this format.  I connected with Nicole Wheeler on 06/24/19 at 9:30 by telephone and verified that I am speaking with the correct person using two identifiers. Nicole Wheeler is currently located at home and no one is currently with her during visit. The provider, Mary-Margaret Hassell Done, FNP is located in their office at time of visit.  I discussed the limitations, risks, security and privacy concerns of performing an evaluation and management service by telephone and the availability of in person appointments. I also discussed with the patient that there may be a patient responsible charge related to this service. The patient expressed understanding and agreed to proceed.   History and Present Illness:   Chief Complaint: Medical Management of Chronic Issues    HPI:  1. Essential hypertension No c/o chest pain, sob or headache. Does  check blood pressure at home occasionally. Blood pressure readings are up and down. Is down when she takes her meds. BP Readings from Last 3 Encounters:  03/24/19 (!) 158/74  07/31/18 138/74  03/15/18 133/65     2. Pure hypercholesterolemia Does not watch diet and does very little exercise. She does say as active as possible. Lab Results  Component Value Date   CHOL 192 03/24/2019   HDL 45 03/24/2019   LDLCALC 125 (H) 03/24/2019   TRIG 125 03/24/2019   CHOLHDL 4.3 03/24/2019     3. Hypothyroidism, unspecified type No problems that she is aware of.  4. Osteopenia, unspecified location She has aged out of future  dexascans. She is currently on evista and since it is not bothering her she will just continue to take  5. BMI 29.0-29.9,adult No recent weight changes Wt Readings from Last 3 Encounters:  03/24/19 164 lb (74.4 kg)  07/31/18 162 lb (73.5 kg)  03/15/18 162 lb (73.5 kg)   BMI Readings from Last 3 Encounters:  03/24/19 32.03 kg/m  07/31/18 31.64 kg/m  03/15/18 31.64 kg/m       Outpatient Encounter Medications as of 06/24/2019  Medication Sig  . aspirin EC 81 MG tablet Take 81 mg by mouth daily.  . Cholecalciferol (VITAMIN D) 2000 units CAPS Take 1,000 Units by mouth daily.  . diphenhydramine-acetaminophen (TYLENOL PM) 25-500 MG TABS tablet Take 0.5 tablets by mouth at bedtime as needed.   . dorzolamide-timolol (COSOPT) 22.3-6.8 MG/ML ophthalmic solution   . fish oil-omega-3 fatty acids 1000 MG capsule Take 1 capsule by mouth daily.  . hydrochlorothiazide (HYDRODIURIL) 25 MG tablet Take 1 tablet (25 mg total) by mouth daily.  Marland Kitchen latanoprost (XALATAN) 0.005 % ophthalmic solution   . levothyroxine (SYNTHROID) 100 MCG tablet Take 1 tablet (100 mcg total) by mouth daily.  Marland Kitchen losartan (COZAAR) 100 MG tablet Take 1 tablet (100 mg total) by mouth daily.  . meloxicam (MOBIC) 15 MG tablet Take 1 tablet (15 mg total) by mouth daily.  . Multiple Vitamins-Minerals (CENTRUM SILVER PO) Take by mouth.  . Multiple Vitamins-Minerals (PRESERVISION/LUTEIN PO) Take by mouth.  . naproxen sodium (ANAPROX) 220 MG tablet Take 220 mg by mouth daily. Takes at night time daily   . raloxifene (  EVISTA) 60 MG tablet Take 1 tablet (60 mg total) by mouth daily.     Past Surgical History:  Procedure Laterality Date  . APPENDECTOMY    . EYE SURGERY Right    cataract removal  . LUMBAR LAMINECTOMY/DECOMPRESSION MICRODISCECTOMY  12/07/2011   Procedure: LUMBAR LAMINECTOMY/DECOMPRESSION MICRODISCECTOMY 1 LEVEL;  Surgeon: Ophelia Charter, MD;  Location: [facility];  Service: Neurosurgery;  Laterality: Right;   Right Lumbar Five-Sacral One Diskectomy  . throidectomy    . TUBAL LIGATION      Family History  Problem Relation Age of Onset  . Stroke Mother 79  . Hypertension Mother   . Stroke Father 10  . Hypertension Father   . Heart attack Sister 38       heart attack  . Breast cancer Maternal Aunt   . Healthy Daughter   . Healthy Son   . Healthy Son     New complaints: None today other then allergies  Social history: Lives by herself- her daughter checks on her daily  Controlled substance contract: n/a    Review of Systems  Constitutional: Negative for diaphoresis and weight loss.  Eyes: Negative for blurred vision, double vision and pain.  Respiratory: Negative for shortness of breath.   Cardiovascular: Negative for chest pain, palpitations, orthopnea and leg swelling.  Gastrointestinal: Negative for abdominal pain.  Skin: Negative for rash.  Neurological: Negative for dizziness, sensory change, loss of consciousness, weakness and headaches.  Endo/Heme/Allergies: Negative for polydipsia. Does not bruise/bleed easily.  Psychiatric/Behavioral: Negative for memory loss. The patient does not have insomnia.   All other systems reviewed and are negative.    Observations/Objective: Alert and oriented- answers all questions appropriately No distress    Assessment and Plan: Nicole Wheeler comes in today with chief complaint of Medical Management of Chronic Issues   Diagnosis and orders addressed:  1. Essential hypertension Low sodium diet - CBC with Differential/Platelet; Future - CMP14+EGFR; Future - losartan (COZAAR) 100 MG tablet; Take 1 tablet (100 mg total) by mouth daily.  Dispense: 90 tablet; Refill: 1 - hydrochlorothiazide (HYDRODIURIL) 25 MG tablet; Take 1 tablet (25 mg total) by mouth daily.  Dispense: 90 tablet; Refill: 1  2. Pure hypercholesterolemia Low fat diet - Lipid panel; Future  3. Hypothyroidism, unspecified type - Thyroid Panel With TSH; Future  - levothyroxine (SYNTHROID) 100 MCG tablet; Take 1 tablet (100 mcg total) by mouth daily.  Dispense: 90 tablet; Refill: 1  4. Osteopenia, unspecified location Patient is going wean off evista  5. BMI 29.0-29.9,adult Discussed diet and exercise for person with BMI >25 Will recheck weight in 3-6 months   Labs pending Health Maintenance reviewed Diet and exercise encouraged   Follow Up Instructions: 3 months    I discussed the assessment and treatment plan with the patient. The patient was provided an opportunity to ask questions and all were answered. The patient agreed with the plan and demonstrated an understanding of the instructions.   The patient was advised to call back or seek an in-person evaluation if the symptoms worsen or if the condition fails to improve as anticipated.  The above assessment and management plan was discussed with the patient. The patient verbalized understanding of and has agreed to the management plan. Patient is aware to call the clinic if symptoms persist or worsen. Patient is aware when to return to the clinic for a follow-up visit. Patient educated on when it is appropriate to go to the emergency department.   Time  call ended:  9:48 I provided 18 minutes of non-face-to-face time during this encounter.    Mary-Margaret Hassell Done, FNP

## 2019-06-27 ENCOUNTER — Other Ambulatory Visit: Payer: Medicare HMO

## 2019-06-27 ENCOUNTER — Other Ambulatory Visit: Payer: Self-pay

## 2019-06-27 DIAGNOSIS — E039 Hypothyroidism, unspecified: Secondary | ICD-10-CM

## 2019-06-27 DIAGNOSIS — I10 Essential (primary) hypertension: Secondary | ICD-10-CM

## 2019-06-27 DIAGNOSIS — E7800 Pure hypercholesterolemia, unspecified: Secondary | ICD-10-CM

## 2019-06-28 LAB — CMP14+EGFR
ALT: 15 IU/L (ref 0–32)
AST: 21 IU/L (ref 0–40)
Albumin/Globulin Ratio: 1.8 (ref 1.2–2.2)
Albumin: 4.1 g/dL (ref 3.6–4.6)
Alkaline Phosphatase: 82 IU/L (ref 39–117)
BUN/Creatinine Ratio: 21 (ref 12–28)
BUN: 31 mg/dL — ABNORMAL HIGH (ref 8–27)
Bilirubin Total: 0.2 mg/dL (ref 0.0–1.2)
CO2: 23 mmol/L (ref 20–29)
Calcium: 9.6 mg/dL (ref 8.7–10.3)
Chloride: 102 mmol/L (ref 96–106)
Creatinine, Ser: 1.46 mg/dL — ABNORMAL HIGH (ref 0.57–1.00)
GFR calc Af Amer: 37 mL/min/{1.73_m2} — ABNORMAL LOW (ref >59)
GFR calc non Af Amer: 32 mL/min/{1.73_m2} — ABNORMAL LOW (ref >59)
Globulin, Total: 2.3 g/dL (ref 1.5–4.5)
Glucose: 90 mg/dL (ref 65–99)
Potassium: 4.4 mmol/L (ref 3.5–5.2)
Sodium: 143 mmol/L (ref 134–144)
Total Protein: 6.4 g/dL (ref 6.0–8.5)

## 2019-06-28 LAB — CBC WITH DIFFERENTIAL/PLATELET
Basophils Absolute: 0.1 10*3/uL (ref 0.0–0.2)
Basos: 1 %
EOS (ABSOLUTE): 0.3 10*3/uL (ref 0.0–0.4)
Eos: 4 %
Hematocrit: 38.4 % (ref 34.0–46.6)
Hemoglobin: 12.9 g/dL (ref 11.1–15.9)
Immature Grans (Abs): 0 10*3/uL (ref 0.0–0.1)
Immature Granulocytes: 0 %
Lymphocytes Absolute: 1.4 10*3/uL (ref 0.7–3.1)
Lymphs: 20 %
MCH: 31.2 pg (ref 26.6–33.0)
MCHC: 33.6 g/dL (ref 31.5–35.7)
MCV: 93 fL (ref 79–97)
Monocytes Absolute: 0.6 10*3/uL (ref 0.1–0.9)
Monocytes: 9 %
Neutrophils Absolute: 4.6 10*3/uL (ref 1.4–7.0)
Neutrophils: 66 %
Platelets: 256 10*3/uL (ref 150–450)
RBC: 4.13 x10E6/uL (ref 3.77–5.28)
RDW: 12 % (ref 11.7–15.4)
WBC: 7 10*3/uL (ref 3.4–10.8)

## 2019-06-28 LAB — THYROID PANEL WITH TSH
Free Thyroxine Index: 3 (ref 1.2–4.9)
T3 Uptake Ratio: 33 % (ref 24–39)
T4, Total: 9 ug/dL (ref 4.5–12.0)
TSH: 1.29 u[IU]/mL (ref 0.450–4.500)

## 2019-06-28 LAB — LIPID PANEL
Chol/HDL Ratio: 3.9 ratio (ref 0.0–4.4)
Cholesterol, Total: 183 mg/dL (ref 100–199)
HDL: 47 mg/dL (ref >39)
LDL Chol Calc (NIH): 118 mg/dL — ABNORMAL HIGH (ref 0–99)
Triglycerides: 98 mg/dL (ref 0–149)
VLDL Cholesterol Cal: 18 mg/dL (ref 5–40)

## 2019-06-30 ENCOUNTER — Ambulatory Visit (INDEPENDENT_AMBULATORY_CARE_PROVIDER_SITE_OTHER): Payer: Medicare HMO | Admitting: *Deleted

## 2019-06-30 DIAGNOSIS — Z0000 Encounter for general adult medical examination without abnormal findings: Secondary | ICD-10-CM | POA: Diagnosis not present

## 2019-06-30 NOTE — Progress Notes (Signed)
MEDICARE ANNUAL WELLNESS VISIT  06/30/2019  Telephone Visit Disclaimer This Medicare AWV was conducted by telephone due to national recommendations for restrictions regarding the COVID-19 Pandemic (e.g. social distancing).  I verified, using two identifiers, that I am speaking with Nicole Wheeler or their authorized healthcare agent. I discussed the limitations, risks, security, and privacy concerns of performing an evaluation and management service by telephone and the potential availability of an in-person appointment in the future. The patient expressed understanding and agreed to proceed.   Subjective:  Nicole Wheeler is a 86 y.o. female patient of Chevis Pretty, [place] who had a Medicare Annual Wellness Visit today via telephone. Nicole Wheeler is retired and lives alone. She is widowed and has 3 grown children. She reports that she is socially active and does interact with friends/family regularly. She is involved in her church. She is not physically active and enjoys sewing, quilting, and word puzzles.   Patient Care Team: Chevis Pretty, FNP as PCP - General (Nurse Practitioner) Newman Pies, MD as Consulting Physician (Neurosurgery) Clent Jacks, MD as Consulting Physician (Ophthalmology) Ilean China, RN as Registered Nurse  Advanced Directives 06/30/2019 09/20/2017 08/17/2015 09/17/2014 02/09/2014 12/06/2011  Does Patient Have a Medical Advance Directive? Yes Yes Yes Yes Yes Patient has advance directive, copy not in chart  Type of Advance Directive Living will;Healthcare Power of Attorney Living will;Healthcare Power of [place] will [place]  Does patient want to make changes to medical advance directive? - No - Patient declined No - Patient declined - - -  Copy of [place] in Chart? No - copy requested No - copy requested No - copy requested - [facility] Utilization Over the  Past 12 Months: # of hospitalizations or ER visits: 0 # of surgeries: 0  Review of Systems    Patient reports that her overall health is unchanged compared to last year.  History obtained from the patient and patient chart.   Patient Reported Readings (BP, Pulse, CBG, Weight, etc) none  Pain Assessment Pain : No/denies pain     Current Medications & Allergies (verified) Allergies as of 06/30/2019      Reactions   Ace Inhibitors Cough   Crestor [rosuvastatin Calcium]    Myalgia   Lipitor [atorvastatin]    Myalgia      Medication List       Accurate as of June 30, 2019  2:49 PM. If you have any questions, ask your nurse or doctor.        aspirin EC 81 MG tablet Take 81 mg by mouth daily.   CENTRUM SILVER PO Take by mouth.   PRESERVISION/LUTEIN PO Take by mouth.   diphenhydramine-acetaminophen 25-500 MG Tabs tablet Commonly known as: TYLENOL PM Take 0.5 tablets by mouth at bedtime as needed.   dorzolamide-timolol 22.3-6.8 MG/ML ophthalmic solution Commonly known as: COSOPT   fish oil-omega-3 fatty acids 1000 MG capsule Take 1 capsule by mouth daily.   hydrochlorothiazide 25 MG tablet Commonly known as: HYDRODIURIL Take 1 tablet (25 mg total) by mouth daily.   latanoprost 0.005 % ophthalmic solution Commonly known as: XALATAN   levothyroxine 100 MCG tablet Commonly known as: SYNTHROID Take 1 tablet (100 mcg total) by mouth daily.   losartan 100 MG tablet Commonly known as: COZAAR Take 1 tablet (100 mg total) by mouth daily.   meloxicam 15 MG tablet Commonly known as: MOBIC Take  1 tablet (15 mg total) by mouth daily.   naproxen sodium 220 MG tablet Commonly known as: ALEVE Take 220 mg by mouth daily. Takes at night time daily   Vitamin D 50 MCG (2000 UT) Caps Take 1,000 Units by mouth daily.       History (reviewed): Past Medical History:  Diagnosis Date  . Arthritis   . Cataract   . Glaucoma   . Hyperlipidemia   . Hypertension   .  Hypothyroidism    dr don Laurance Flatten   pcp  . Macular degeneration   . Osteopenia   . Shingles   . Spinal stenosis    Past Surgical History:  Procedure Laterality Date  . APPENDECTOMY    . EYE SURGERY Right    cataract removal  . LUMBAR LAMINECTOMY/DECOMPRESSION MICRODISCECTOMY  12/07/2011   Procedure: LUMBAR LAMINECTOMY/DECOMPRESSION MICRODISCECTOMY 1 LEVEL;  Surgeon: Ophelia Charter, MD;  Location: [facility];  Service: Neurosurgery;  Laterality: Right;  Right Lumbar Five-Sacral One Diskectomy  . throidectomy    . TUBAL LIGATION     Family History  Problem Relation Age of Onset  . Stroke Mother 71  . Hypertension Mother   . Stroke Father 22  . Hypertension Father   . Heart attack Sister 53       heart attack  . Breast cancer Maternal Aunt   . Healthy Daughter   . Healthy Son   . Healthy Son    Social History   Socioeconomic History  . Marital status: Widowed    Spouse name: Not on file  . Number of children: 3  . Years of education: 30  . Highest education level: Some college, no degree  Occupational History  . Occupation: Retired    Comment: Veterinary surgeon work  Tobacco Use  . Smoking status: Former Smoker    Packs/day: 1.00    Years: 25.00    Pack years: 25.00    Types: Cigarettes    Quit date: 03/13/1978    Years since quitting: 41.3  . Smokeless tobacco: Never Used  Substance and Sexual Activity  . Alcohol use: Yes    Comment: occasional glass of wine  . Drug use: No  . Sexual activity: Never  Other Topics Concern  . Not on file  Social History Narrative   Nicole Wheeler is retired and lives alone. She is widowed and has 3 grown children. She enjoys sewing, quilting, and word puzzles. She is involved in her church.    Social Determinants of Health   Financial Resource Strain:   . Difficulty of Paying Living Expenses:   Food Insecurity:   . Worried About Charity fundraiser in the Last Year:   . Arboriculturist in the Last Year:   Transportation Needs:   . Consulting civil engineer (Medical):   Marland Kitchen Lack of Transportation (Non-Medical):   Physical Activity:   . Days of Exercise per Week:   . Minutes of Exercise per Session:   Stress:   . Feeling of Stress :   Social Connections:   . Frequency of Communication with Friends and Family:   . Frequency of Social Gatherings with Friends and Family:   . Attends Religious Services:   . Active Member of Clubs or Organizations:   . Attends Archivist Meetings:   Marland Kitchen Marital Status:     Activities of Daily Living In your present state of health, do you have any difficulty performing the following activities: 06/30/2019  Hearing? N  Vision? N  Difficulty concentrating or making decisions? N  Walking or climbing stairs? N  Dressing or bathing? N  Doing errands, shopping? N  Preparing Food and eating ? N  Using the Toilet? N  In the past six months, have you accidently leaked urine? Y  Comment stress incontinence  Do you have problems with loss of bowel control? N  Managing your Medications? N  Managing your Finances? N  Housekeeping or managing your Housekeeping? N  Some recent data might be hidden    Patient Education/ Literacy What is the last grade level you completed in school?: 13  Exercise Current Exercise Habits: The patient does not participate in regular exercise at present, Exercise limited by: orthopedic condition(s)(knee pain)  Diet Patient reports consuming 2-3 meals a day and 0 snack(s) a day Patient reports that her primary diet is: Regular Patient reports that she does have regular access to food.   Depression Screen PHQ 2/9 Scores 06/30/2019 06/24/2019 03/24/2019 07/31/2018 03/15/2018 01/30/2018 01/23/2018  PHQ - 2 Score 0 0 0 0 0 0 0  PHQ- 9 Score - - - - - - 0     Fall Risk Fall Risk  06/30/2019 06/24/2019 03/24/2019 07/31/2018 03/15/2018  Falls in the past year? 0 0 0 0 0  Comment - - - - -  Number falls in past yr: - - - - -  Injury with Fall? - - - - -  Follow up - -  - - -     Objective:  Nicole Wheeler seemed alert and oriented and she participated appropriately during our telephone visit.  Blood Pressure Weight BMI  BP Readings from Last 3 Encounters:  03/24/19 (!) 158/74  07/31/18 138/74  03/15/18 133/65   Wt Readings from Last 3 Encounters:  03/24/19 164 lb (74.4 kg)  07/31/18 162 lb (73.5 kg)  03/15/18 162 lb (73.5 kg)   BMI Readings from Last 1 Encounters:  03/24/19 32.03 kg/m    *Unable to obtain current vital signs, weight, and BMI due to telephone visit type  Hearing/Vision  . Nicole Wheeler did not seem to have difficulty with hearing/understanding during the telephone conversation . Reports that she has had a formal eye exam by an eye care professional within the past year . Reports that she has not had a formal hearing evaluation within the past year *Unable to fully assess hearing and vision during telephone visit type  Cognitive Function: 6CIT Screen 06/30/2019  What Year? 0 points  What month? 0 points  What time? 0 points  Count back from 20 0 points  Months in reverse 0 points  Repeat phrase 0 points  Total Score 0   (Normal:0-7, Significant for Dysfunction: >8)  Normal Cognitive Function Screening: Yes   Immunization & Health Maintenance Record Immunization History  Administered Date(s) Administered  . Influenza Split 12/08/2011, 12/14/2012  . Influenza, High Dose Seasonal PF 01/08/2014  . Influenza,inj,Quad PF,6+ Mos 12/11/2016  . Influenza-Unspecified 01/13/2015, 12/20/2015, 01/01/2018, 12/16/2018  . PFIZER SARS-COV-2 Vaccination 04/04/2019, 04/25/2019  . Pneumococcal Conjugate-13 06/15/2014  . Pneumococcal Polysaccharide-23 03/13/1998  . Tdap 12/08/2010    Health Maintenance  Topic Date Due  . DEXA SCAN  06/29/2020 (Originally 01/03/2018)  . MAMMOGRAM  09/09/2019  . INFLUENZA VACCINE  10/12/2019  . TETANUS/TDAP  12/07/2020  . COVID-19 Vaccine  Completed  . PNA vac Low Risk Adult  Completed         Assessment  This is a routine wellness examination for Nicole P  Stann Wheeler.  Health Maintenance: Due or Overdue There are no preventive care reminders to display for this patient.  Nicole Wheeler does not need a referral for Community Assistance: Care Management:   no Social Work:    no Prescription Assistance:  no Nutrition/Diabetes Education:  no   Plan:  Personalized Goals Goals Addressed            This Visit's Progress   . Patient Stated       06/30/2019 AWV Goal: Keep All Scheduled Appointments  Over the next year, patient will attend all scheduled appointments with their PCP and any specialists that they see.       Personalized Health Maintenance & Screening Recommendations    Lung Cancer Screening Recommended: no (Low Dose CT Chest recommended if Age 75-80 years, 30 pack-year currently smoking OR have quit w/in past 15 years) Hepatitis C Screening recommended: no HIV Screening recommended: np Advanced Directives: Written information was not prepared per patient's request.  Referrals & Orders No orders of the defined types were placed in this encounter.   Follow-up Plan . Follow-up with Chevis Pretty, FNP as planned   I have personally reviewed and noted the following in the patient's chart:   . Medical and social history . Use of alcohol, tobacco or illicit drugs  . Current medications and supplements . Functional ability and status . Nutritional status . Physical activity . Advanced directives . List of other physicians . Hospitalizations, surgeries, and ER visits in previous 12 months . Vitals . Screenings to include cognitive, depression, and falls . Referrals and appointments  In addition, I have reviewed and discussed with Nicole Wheeler certain preventive protocols, quality metrics, and best practice recommendations. A written personalized care plan for preventive services as well as general preventive health recommendations is available  and can be mailed to the patient at her request.      Oretha Milch [id number]

## 2019-07-01 ENCOUNTER — Telehealth: Payer: Self-pay | Admitting: Nurse Practitioner

## 2019-07-01 NOTE — Telephone Encounter (Signed)
Please mail copy of recent labs

## 2019-07-02 NOTE — Telephone Encounter (Signed)
Mailed labs to pt

## 2019-07-08 DIAGNOSIS — Z961 Presence of intraocular lens: Secondary | ICD-10-CM | POA: Diagnosis not present

## 2019-07-08 DIAGNOSIS — H353132 Nonexudative age-related macular degeneration, bilateral, intermediate dry stage: Secondary | ICD-10-CM | POA: Diagnosis not present

## 2019-07-08 DIAGNOSIS — H02831 Dermatochalasis of right upper eyelid: Secondary | ICD-10-CM | POA: Diagnosis not present

## 2019-07-08 DIAGNOSIS — H02834 Dermatochalasis of left upper eyelid: Secondary | ICD-10-CM | POA: Diagnosis not present

## 2019-07-08 DIAGNOSIS — H401132 Primary open-angle glaucoma, bilateral, moderate stage: Secondary | ICD-10-CM | POA: Diagnosis not present

## 2019-07-14 IMAGING — DX DG CHEST 2V
2 series · 2 of 2 positions shown · non-contrast
Comparison: 11/26/2014

CLINICAL DATA: Hypertension

EXAM:
CHEST  2 VIEW

[chest pa]
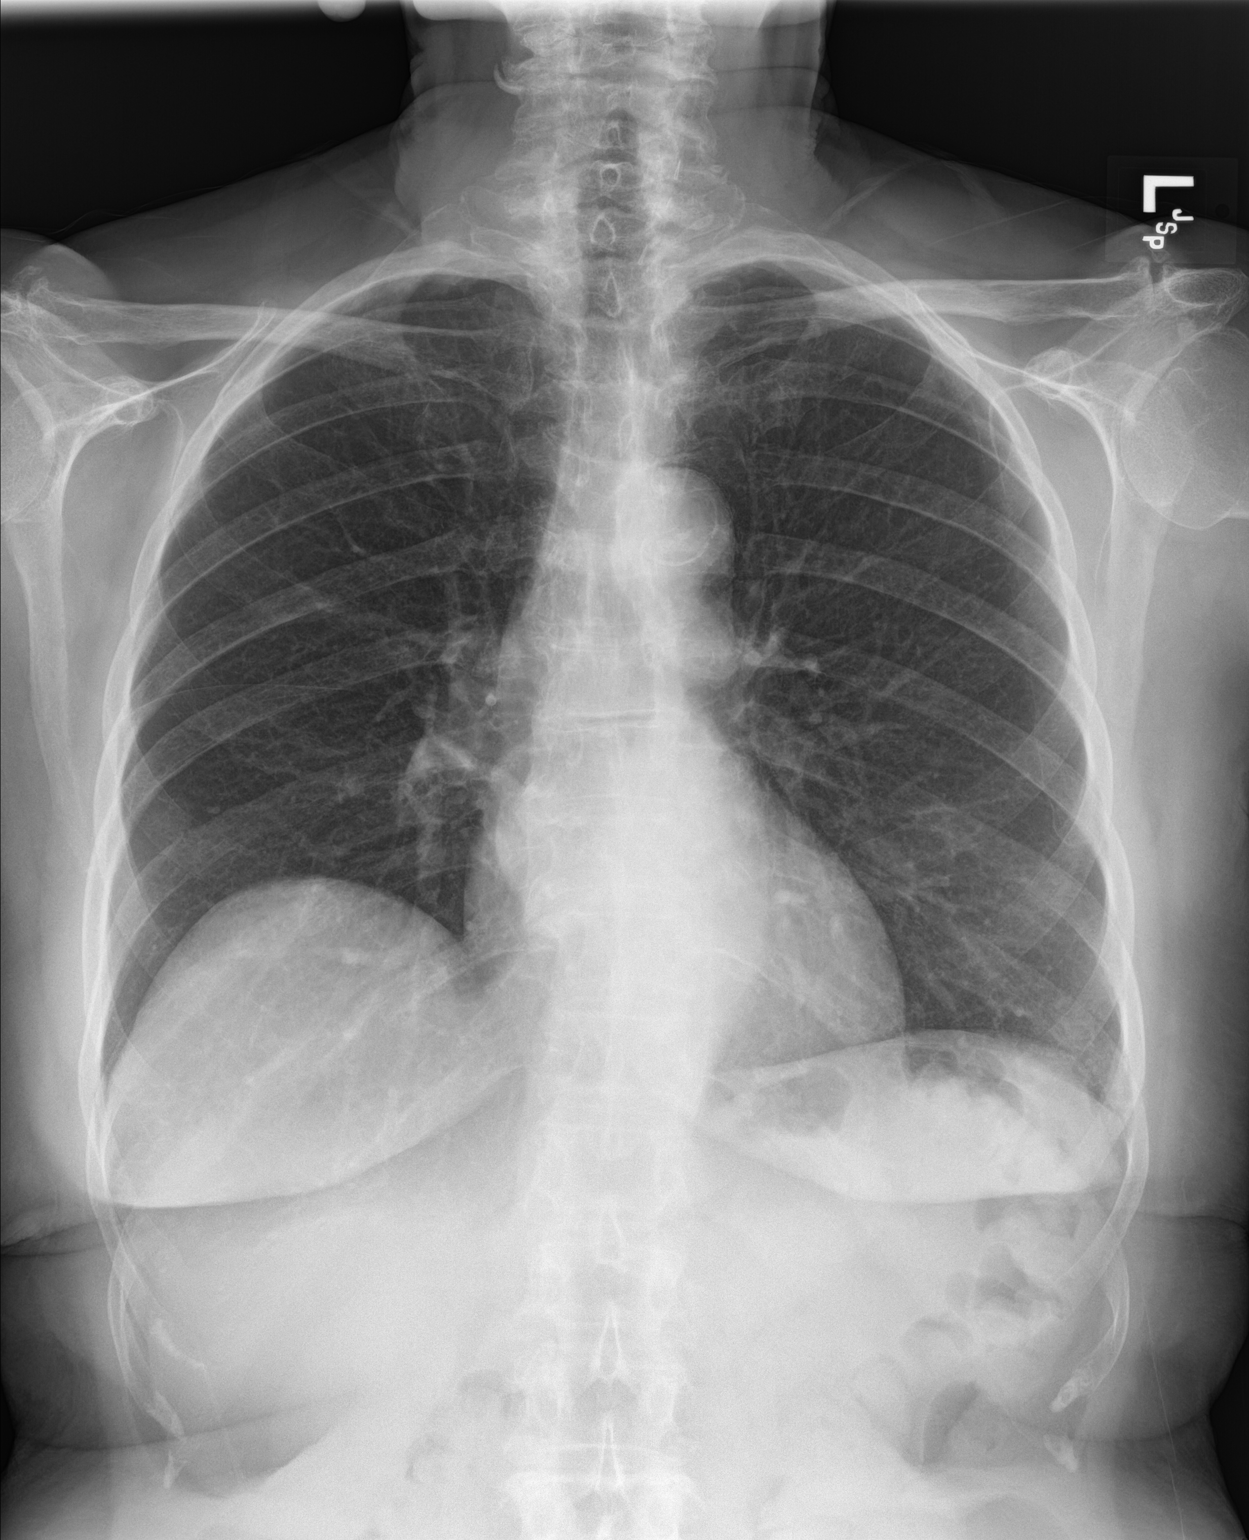

[chest lat]
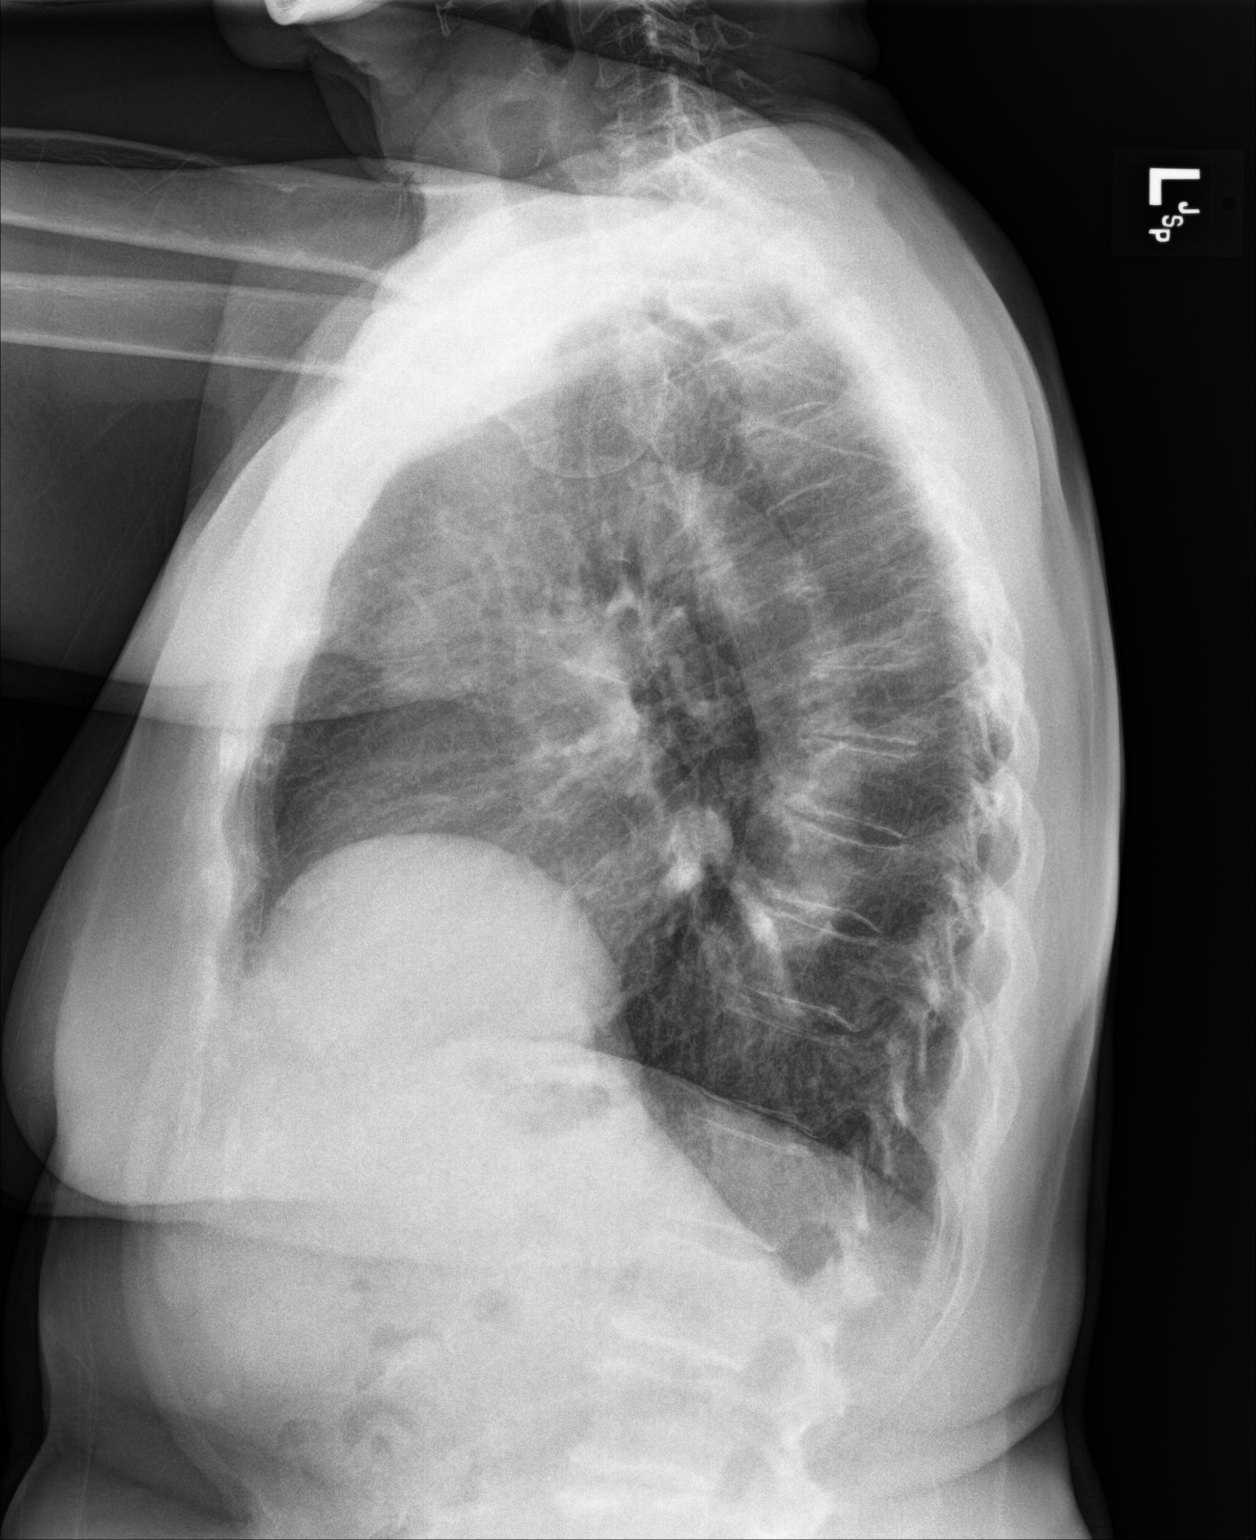

[2 of 2 positions shown; findings below may reference images not displayed]

FINDINGS: The heart size and mediastinal contours are within normal limits.
Both lungs are clear. The visualized skeletal structures are
unremarkable.
IMPRESSION: No active cardiopulmonary disease.

## 2019-07-30 DIAGNOSIS — M1712 Unilateral primary osteoarthritis, left knee: Secondary | ICD-10-CM | POA: Insufficient documentation

## 2019-07-31 DIAGNOSIS — M1712 Unilateral primary osteoarthritis, left knee: Secondary | ICD-10-CM | POA: Diagnosis not present

## 2019-08-05 DIAGNOSIS — M25511 Pain in right shoulder: Secondary | ICD-10-CM | POA: Diagnosis not present

## 2019-08-05 DIAGNOSIS — M47812 Spondylosis without myelopathy or radiculopathy, cervical region: Secondary | ICD-10-CM | POA: Diagnosis not present

## 2019-08-05 DIAGNOSIS — M542 Cervicalgia: Secondary | ICD-10-CM | POA: Diagnosis not present

## 2019-08-07 DIAGNOSIS — M1712 Unilateral primary osteoarthritis, left knee: Secondary | ICD-10-CM | POA: Diagnosis not present

## 2019-08-14 DIAGNOSIS — M1712 Unilateral primary osteoarthritis, left knee: Secondary | ICD-10-CM | POA: Diagnosis not present

## 2019-09-10 ENCOUNTER — Telehealth: Payer: Medicare HMO

## 2019-09-11 DIAGNOSIS — M542 Cervicalgia: Secondary | ICD-10-CM | POA: Insufficient documentation

## 2019-09-11 DIAGNOSIS — M25511 Pain in right shoulder: Secondary | ICD-10-CM | POA: Insufficient documentation

## 2019-09-12 ENCOUNTER — Telehealth: Payer: Self-pay | Admitting: Nurse Practitioner

## 2019-09-12 NOTE — Chronic Care Management (AMB) (Signed)
  Chronic Care Management   Note  09/12/2019 Name: Nicole Wheeler MRN: [id number] DOB: 04-08-1930  Nicole Wheeler is a 86 y.o. year old female who is a primary care patient of Chevis Pretty, [place] and is actively engaged with the care management team. I reached out to [name] by phone today to assist with re-scheduling an initial visit with the RN Case Manager.  Follow up plan: Patient declines further follow up and engagement by the care management team. Appropriate care team members and provider have been notified via electronic communication.   [place], [place] [address] Direct Dial: [phone] [email or web address] Website: [email or web address]

## 2019-09-16 ENCOUNTER — Other Ambulatory Visit: Payer: Self-pay | Admitting: Nurse Practitioner

## 2019-09-16 DIAGNOSIS — Z1231 Encounter for screening mammogram for malignant neoplasm of breast: Secondary | ICD-10-CM

## 2019-09-24 ENCOUNTER — Ambulatory Visit: Payer: Self-pay | Admitting: Nurse Practitioner

## 2019-09-29 ENCOUNTER — Encounter: Payer: Self-pay | Admitting: Nurse Practitioner

## 2019-09-29 ENCOUNTER — Ambulatory Visit (INDEPENDENT_AMBULATORY_CARE_PROVIDER_SITE_OTHER): Payer: Medicare HMO | Admitting: Nurse Practitioner

## 2019-09-29 ENCOUNTER — Other Ambulatory Visit: Payer: Self-pay

## 2019-09-29 VITALS — BP 151/71 | HR 69 | Temp 98.5°F | Resp 20 | Ht 60.0 in | Wt 160.0 lb

## 2019-09-29 DIAGNOSIS — E7800 Pure hypercholesterolemia, unspecified: Secondary | ICD-10-CM | POA: Diagnosis not present

## 2019-09-29 DIAGNOSIS — I10 Essential (primary) hypertension: Secondary | ICD-10-CM

## 2019-09-29 DIAGNOSIS — E039 Hypothyroidism, unspecified: Secondary | ICD-10-CM

## 2019-09-29 DIAGNOSIS — Z6829 Body mass index (BMI) 29.0-29.9, adult: Secondary | ICD-10-CM | POA: Diagnosis not present

## 2019-09-29 DIAGNOSIS — M8580 Other specified disorders of bone density and structure, unspecified site: Secondary | ICD-10-CM

## 2019-09-29 MED ORDER — HYDROCHLOROTHIAZIDE 25 MG PO TABS: 25.0000 mg | ORAL_TABLET | Freq: Every day | ORAL | 1 refills | Status: DC

## 2019-09-29 MED ORDER — LOSARTAN POTASSIUM 100 MG PO TABS: 100.0000 mg | ORAL_TABLET | Freq: Every day | ORAL | 1 refills | Status: DC

## 2019-09-29 MED ORDER — LEVOTHYROXINE SODIUM 100 MCG PO TABS: 100.0000 ug | ORAL_TABLET | Freq: Every day | ORAL | 1 refills | Status: DC

## 2019-09-29 NOTE — Progress Notes (Signed)
Subjective:    Patient ID: Nicole Wheeler, female    DOB: 1930-11-09, 86 y.o.   MRN: [id number]   Chief Complaint: No chief complaint on file.    HPI:  1. Essential hypertension BP Readings from Last 3 Encounters:  09/29/19 (!) 151/71  03/24/19 (!) 158/74  07/31/18 138/74   Takes pressure at home regularly ~140's/70's. Takes medication as prescribed. Avoids extra salt in foods. Denies chest pain, weakness, or headache.   2. Hypothyroidism, unspecified type Takes medication as prescribed.  Lab Results  Component Value Date   TSH 1.290 06/27/2019     3. Osteopenia, unspecified location Takes medication as prescribed. Has steroid injections for pain control in knees and shoulders. Last bone density in 2015. Patient does not want to do anymore.  4. Pure hypercholesterolemia Lab Results  Component Value Date   CHOL 183 06/27/2019   HDL 47 06/27/2019   LDLCALC 118 (H) 06/27/2019   TRIG 98 06/27/2019   CHOLHDL 3.9 06/27/2019   Does not take cholesterol medication. Attempted to take red yeast rice supp but is afraid it will cause muscle pain like statins have.   5. BMI 29.0-29.9,adult Weight is down 4 lbs. BMI Readings from Last 3 Encounters:  09/29/19 31.25 kg/m  03/24/19 32.03 kg/m  07/31/18 31.64 kg/m   Wt Readings from Last 3 Encounters:  09/29/19 160 lb (72.6 kg)  03/24/19 164 lb (74.4 kg)  07/31/18 162 lb (73.5 kg)   Denies formal exercise. Keeps up with house work, has stairs in home.     Outpatient Encounter Medications as of 09/29/2019  Medication Sig   aspirin EC 81 MG tablet Take 81 mg by mouth daily.   Cholecalciferol (VITAMIN D) 2000 units CAPS Take 1,000 Units by mouth daily.   diphenhydramine-acetaminophen (TYLENOL PM) 25-500 MG TABS tablet Take 0.5 tablets by mouth at bedtime as needed.    dorzolamide-timolol (COSOPT) 22.3-6.8 MG/ML ophthalmic solution    fish oil-omega-3 fatty acids 1000 MG capsule Take 1 capsule by mouth daily.    hydrochlorothiazide (HYDRODIURIL) 25 MG tablet Take 1 tablet (25 mg total) by mouth daily.   latanoprost (XALATAN) 0.005 % ophthalmic solution    levothyroxine (SYNTHROID) 100 MCG tablet Take 1 tablet (100 mcg total) by mouth daily.   losartan (COZAAR) 100 MG tablet Take 1 tablet (100 mg total) by mouth daily.   meloxicam (MOBIC) 15 MG tablet Take 1 tablet (15 mg total) by mouth daily.   Multiple Vitamins-Minerals (CENTRUM SILVER PO) Take by mouth.   Multiple Vitamins-Minerals (PRESERVISION/LUTEIN PO) Take by mouth.   naproxen sodium (ANAPROX) 220 MG tablet Take 220 mg by mouth daily. Takes at night time daily    No facility-administered encounter medications on file as of 09/29/2019.    Past Surgical History:  Procedure Laterality Date   APPENDECTOMY     EYE SURGERY Right    cataract removal   LUMBAR LAMINECTOMY/DECOMPRESSION MICRODISCECTOMY  12/07/2011   Procedure: LUMBAR LAMINECTOMY/DECOMPRESSION MICRODISCECTOMY 1 LEVEL;  Surgeon: Ophelia Charter, MD;  Location: [facility];  Service: Neurosurgery;  Laterality: Right;  Right Lumbar Five-Sacral One Diskectomy   throidectomy     TUBAL LIGATION      Family History  Problem Relation Age of Onset   Stroke Mother 59   Hypertension Mother    Stroke Father 61   Hypertension Father    Heart attack Sister 76       heart attack   Breast cancer Maternal Aunt    Healthy  Daughter    Healthy Son    Healthy Son     New complaints: No new complaints.   Social history: Lives at home alone, has close family with children and grandchildren, was playing bridge before COVID.   Controlled substance contract: n.a    Review of Systems  HENT: Negative.   Eyes: Negative.   Respiratory: Negative.   Cardiovascular: Negative.   Gastrointestinal: Negative.   Endocrine: Negative.   Genitourinary: Negative.   Musculoskeletal: Negative.   Skin: Negative.   Allergic/Immunologic: Negative.   Neurological: Negative.    Hematological: Negative.   Psychiatric/Behavioral: Negative.   All other systems reviewed and are negative.      Objective:   Physical Exam Vitals and nursing note reviewed.  Constitutional:      Appearance: Normal appearance.  HENT:     Head: Normocephalic and atraumatic.     Right Ear: Tympanic membrane, ear canal and external ear normal.     Left Ear: Tympanic membrane, ear canal and external ear normal.     Nose: Nose normal.     Mouth/Throat:     Mouth: Mucous membranes are moist.     Pharynx: Oropharynx is clear.  Eyes:     Extraocular Movements: Extraocular movements intact.     Pupils: Pupils are equal, round, and reactive to light.  Cardiovascular:     Rate and Rhythm: Normal rate and regular rhythm.     Pulses: Normal pulses.     Heart sounds: Normal heart sounds.  Pulmonary:     Effort: Pulmonary effort is normal.     Breath sounds: Normal breath sounds.  Abdominal:     General: Bowel sounds are normal.  Musculoskeletal:        General: Normal range of motion.     Cervical back: Normal range of motion and neck supple.  Skin:    General: Skin is warm and dry.     Capillary Refill: Capillary refill takes less than 2 seconds.  Neurological:     General: No focal deficit present.     Mental Status: She is alert and oriented to person, place, and time. Mental status is at baseline.  Psychiatric:        Mood and Affect: Mood normal.        Behavior: Behavior normal.        Thought Content: Thought content normal.        Judgment: Judgment normal.     BP (!) 151/71    Pulse 69    Temp 98.5 F (36.9 C) (Temporal)    Resp 20    Ht 5' (1.524 m)    Wt 160 lb (72.6 kg)    SpO2 97%    BMI 31.25 kg/m       Assessment & Plan:  Nicole Wheeler comes in today with chief complaint of No chief complaint on file.   Diagnosis and orders addressed:  1. Essential hypertension Monitor blood pressure regularly. Take medication as prescribed. Follow a heart healthy, low  salt diet. Exercise as tolerated.   2. Hypothyroidism, unspecified type Continue taking medication as prescribed.   3. Osteopenia, unspecified location Please follow up with a bone density test, this test should be completed every 2 years. Take supplements as prescribed for bone health.   4. Pure hypercholesterolemia Continue taking medication as prescribed. Avoid foods that are fried and high in fat. Avoid fast food. Patient may try red yeast rice OTC.  5. BMI 29.0-29.9,adult Exercise as  tolerated. Join the [employer] to have a safe place to exercise. Walking is great cardiovascular exercise for heart health and helps lower cholesterol levels.     Meds ordered this encounter  Medications   hydrochlorothiazide (HYDRODIURIL) 25 MG tablet    Sig: Take 1 tablet (25 mg total) by mouth daily.    Dispense:  90 tablet    Refill:  1    Order Specific Question:   Supervising Provider    Answer:   Caryl Pina A [[id number]]   losartan (COZAAR) 100 MG tablet    Sig: Take 1 tablet (100 mg total) by mouth daily.    Dispense:  90 tablet    Refill:  1    Order Specific Question:   Supervising Provider    Answer:   Caryl Pina A [[id number]]   levothyroxine (SYNTHROID) 100 MCG tablet    Sig: Take 1 tablet (100 mcg total) by mouth daily.    Dispense:  90 tablet    Refill:  1    Order Specific Question:   Supervising Provider    Answer:   Caryl Pina A [[id number]]   Orders Placed This Encounter  Procedures   Lipid panel   CBC with Differential/Platelet   CMP14+EGFR     Labs pending Health Maintenance reviewed Diet and exercise encouraged  Follow up plan: 3 month follow up   [place], FNP

## 2019-09-29 NOTE — Patient Instructions (Signed)
DASH Eating Plan DASH stands for "Dietary Approaches to Stop Hypertension." The DASH eating plan is a healthy eating plan that has been shown to reduce high blood pressure (hypertension). It may also reduce your risk for type 2 diabetes, heart disease, and stroke. The DASH eating plan may also help with weight loss. What are tips for following this plan?  General guidelines  Avoid eating more than 2,300 mg (milligrams) of salt (sodium) a day. If you have hypertension, you may need to reduce your sodium intake to 1,500 mg a day.  Limit alcohol intake to no more than 1 drink a day for nonpregnant women and 2 drinks a day for men. One drink equals 12 oz of beer, 5 oz of wine, or 1 oz of hard liquor.  Work with your health care provider to maintain a healthy body weight or to lose weight. Ask what an ideal weight is for you.  Get at least 30 minutes of exercise that causes your heart to beat faster (aerobic exercise) most days of the week. Activities may include walking, swimming, or biking.  Work with your health care provider or diet and nutrition specialist (dietitian) to adjust your eating plan to your individual calorie needs. Reading food labels   Check food labels for the amount of sodium per serving. Choose foods with less than 5 percent of the Daily Value of sodium. Generally, foods with less than 300 mg of sodium per serving fit into this eating plan.  To find whole grains, look for the word "whole" as the first word in the ingredient list. Shopping  Buy products labeled as "low-sodium" or "no salt added."  Buy fresh foods. Avoid canned foods and premade or frozen meals. Cooking  Avoid adding salt when cooking. Use salt-free seasonings or herbs instead of table salt or sea salt. Check with your health care provider or pharmacist before using salt substitutes.  Do not fry foods. Cook foods using healthy methods such as baking, boiling, grilling, and broiling instead.  Cook with  heart-healthy oils, such as olive, canola, soybean, or sunflower oil. Meal planning  Eat a balanced diet that includes: ? 5 or more servings of fruits and vegetables each day. At each meal, try to fill half of your plate with fruits and vegetables. ? Up to 6-8 servings of whole grains each day. ? Less than 6 oz of lean meat, poultry, or fish each day. A 3-oz serving of meat is about the same size as a deck of cards. One egg equals 1 oz. ? 2 servings of low-fat dairy each day. ? A serving of nuts, seeds, or beans 5 times each week. ? Heart-healthy fats. Healthy fats called Omega-3 fatty acids are found in foods such as flaxseeds and coldwater fish, like sardines, salmon, and mackerel.  Limit how much you eat of the following: ? Canned or prepackaged foods. ? Food that is high in trans fat, such as fried foods. ? Food that is high in saturated fat, such as fatty meat. ? Sweets, desserts, sugary drinks, and other foods with added sugar. ? Full-fat dairy products.  Do not salt foods before eating.  Try to eat at least 2 vegetarian meals each week.  Eat more home-cooked food and less restaurant, buffet, and fast food.  When eating at a restaurant, ask that your food be prepared with less salt or no salt, if possible. What foods are recommended? The items listed may not be a complete list. Talk with your dietitian about   what dietary choices are best for you. Grains Whole-grain or whole-wheat bread. Whole-grain or whole-wheat pasta. Brown rice. Oatmeal. Quinoa. Bulgur. Whole-grain and low-sodium cereals. Pita bread. Low-fat, low-sodium crackers. Whole-wheat flour tortillas. Vegetables Fresh or frozen vegetables (raw, steamed, roasted, or grilled). Low-sodium or reduced-sodium tomato and vegetable juice. Low-sodium or reduced-sodium tomato sauce and tomato paste. Low-sodium or reduced-sodium canned vegetables. Fruits All fresh, dried, or frozen fruit. Canned fruit in natural juice (without  added sugar). Meat and other protein foods Skinless chicken or turkey. Ground chicken or turkey. Pork with fat trimmed off. Fish and seafood. Egg whites. Dried beans, peas, or lentils. Unsalted nuts, nut butters, and seeds. Unsalted canned beans. Lean cuts of beef with fat trimmed off. Low-sodium, lean deli meat. Dairy Low-fat (1%) or fat-free (skim) milk. Fat-free, low-fat, or reduced-fat cheeses. Nonfat, low-sodium ricotta or cottage cheese. Low-fat or nonfat yogurt. Low-fat, low-sodium cheese. Fats and oils Soft margarine without trans fats. Vegetable oil. Low-fat, reduced-fat, or light mayonnaise and salad dressings (reduced-sodium). Canola, safflower, olive, soybean, and sunflower oils. Avocado. Seasoning and other foods Herbs. Spices. Seasoning mixes without salt. Unsalted popcorn and pretzels. Fat-free sweets. What foods are not recommended? The items listed may not be a complete list. Talk with your dietitian about what dietary choices are best for you. Grains Baked goods made with fat, such as croissants, muffins, or some breads. Dry pasta or rice meal packs. Vegetables Creamed or fried vegetables. Vegetables in a cheese sauce. Regular canned vegetables (not low-sodium or reduced-sodium). Regular canned tomato sauce and paste (not low-sodium or reduced-sodium). Regular tomato and vegetable juice (not low-sodium or reduced-sodium). Pickles. Olives. Fruits Canned fruit in a light or heavy syrup. Fried fruit. Fruit in cream or butter sauce. Meat and other protein foods Fatty cuts of meat. Ribs. Fried meat. Bacon. Sausage. Bologna and other processed lunch meats. Salami. Fatback. Hotdogs. Bratwurst. Salted nuts and seeds. Canned beans with added salt. Canned or smoked fish. Whole eggs or egg yolks. Chicken or turkey with skin. Dairy Whole or 2% milk, cream, and half-and-half. Whole or full-fat cream cheese. Whole-fat or sweetened yogurt. Full-fat cheese. Nondairy creamers. Whipped toppings.  Processed cheese and cheese spreads. Fats and oils Butter. Stick margarine. Lard. Shortening. Ghee. Bacon fat. Tropical oils, such as coconut, palm kernel, or palm oil. Seasoning and other foods Salted popcorn and pretzels. Onion salt, garlic salt, seasoned salt, table salt, and sea salt. Worcestershire sauce. Tartar sauce. Barbecue sauce. Teriyaki sauce. Soy sauce, including reduced-sodium. Steak sauce. Canned and packaged gravies. Fish sauce. Oyster sauce. Cocktail sauce. Horseradish that you find on the shelf. Ketchup. Mustard. Meat flavorings and tenderizers. Bouillon cubes. Hot sauce and Tabasco sauce. Premade or packaged marinades. Premade or packaged taco seasonings. Relishes. Regular salad dressings. Where to find more information:  National Heart, Lung, and Blood Institute: www.nhlbi.nih.gov  American Heart Association: www.heart.org Summary  The DASH eating plan is a healthy eating plan that has been shown to reduce high blood pressure (hypertension). It may also reduce your risk for type 2 diabetes, heart disease, and stroke.  With the DASH eating plan, you should limit salt (sodium) intake to 2,300 mg a day. If you have hypertension, you may need to reduce your sodium intake to 1,500 mg a day.  When on the DASH eating plan, aim to eat more fresh fruits and vegetables, whole grains, lean proteins, low-fat dairy, and heart-healthy fats.  Work with your health care provider or diet and nutrition specialist (dietitian) to adjust your eating plan to your   individual calorie needs. This information is not intended to replace advice given to you by your health care provider. Make sure you discuss any questions you have with your health care provider. Document Revised: 02/09/2017 Document Reviewed: 02/21/2016 Elsevier Patient Education  2020 Elsevier Inc.  

## 2019-09-30 ENCOUNTER — Ambulatory Visit
Admission: RE | Admit: 2019-09-30 | Discharge: 2019-09-30 | Disposition: A | Discharge status: Home / Self Care | Payer: PRIVATE HEALTH INSURANCE | Source: Ambulatory Visit | Attending: Nurse Practitioner | Admitting: Nurse Practitioner

## 2019-09-30 DIAGNOSIS — Z1231 Encounter for screening mammogram for malignant neoplasm of breast: Secondary | ICD-10-CM

## 2019-09-30 LAB — CMP14+EGFR
ALT: 16 IU/L (ref 0–32)
AST: 20 IU/L (ref 0–40)
Albumin/Globulin Ratio: 1.9 (ref 1.2–2.2)
Albumin: 4.4 g/dL (ref 3.6–4.6)
Alkaline Phosphatase: 84 IU/L (ref 48–121)
BUN/Creatinine Ratio: 28 (ref 12–28)
BUN: 28 mg/dL — ABNORMAL HIGH (ref 8–27)
Bilirubin Total: 0.2 mg/dL (ref 0.0–1.2)
CO2: 26 mmol/L (ref 20–29)
Calcium: 9.7 mg/dL (ref 8.7–10.3)
Chloride: 100 mmol/L (ref 96–106)
Creatinine, Ser: 1.01 mg/dL — ABNORMAL HIGH (ref 0.57–1.00)
GFR calc Af Amer: 58 mL/min/{1.73_m2} — ABNORMAL LOW (ref >59)
GFR calc non Af Amer: 51 mL/min/{1.73_m2} — ABNORMAL LOW (ref >59)
Globulin, Total: 2.3 g/dL (ref 1.5–4.5)
Glucose: 92 mg/dL (ref 65–99)
Potassium: 4.4 mmol/L (ref 3.5–5.2)
Sodium: 140 mmol/L (ref 134–144)
Total Protein: 6.7 g/dL (ref 6.0–8.5)

## 2019-09-30 LAB — CBC WITH DIFFERENTIAL/PLATELET
Basophils Absolute: 0.1 10*3/uL (ref 0.0–0.2)
Basos: 1 %
EOS (ABSOLUTE): 0.3 10*3/uL (ref 0.0–0.4)
Eos: 4 %
Hematocrit: 35.7 % (ref 34.0–46.6)
Hemoglobin: 11.8 g/dL (ref 11.1–15.9)
Immature Grans (Abs): 0 10*3/uL (ref 0.0–0.1)
Immature Granulocytes: 0 %
Lymphocytes Absolute: 1.7 10*3/uL (ref 0.7–3.1)
Lymphs: 20 %
MCH: 31 pg (ref 26.6–33.0)
MCHC: 33.1 g/dL (ref 31.5–35.7)
MCV: 94 fL (ref 79–97)
Monocytes Absolute: 0.7 10*3/uL (ref 0.1–0.9)
Monocytes: 9 %
Neutrophils Absolute: 5.7 10*3/uL (ref 1.4–7.0)
Neutrophils: 66 %
Platelets: 301 10*3/uL (ref 150–450)
RBC: 3.81 x10E6/uL (ref 3.77–5.28)
RDW: 12 % (ref 11.7–15.4)
WBC: 8.6 10*3/uL (ref 3.4–10.8)

## 2019-09-30 LAB — LIPID PANEL
Chol/HDL Ratio: 5 ratio — ABNORMAL HIGH (ref 0.0–4.4)
Cholesterol, Total: 216 mg/dL — ABNORMAL HIGH (ref 100–199)
HDL: 43 mg/dL (ref >39)
LDL Chol Calc (NIH): 146 mg/dL — ABNORMAL HIGH (ref 0–99)
Triglycerides: 149 mg/dL (ref 0–149)
VLDL Cholesterol Cal: 27 mg/dL (ref 5–40)

## 2019-10-29 DIAGNOSIS — Z0100 Encounter for examination of eyes and vision without abnormal findings: Secondary | ICD-10-CM | POA: Diagnosis not present

## 2019-11-18 DIAGNOSIS — M25511 Pain in right shoulder: Secondary | ICD-10-CM | POA: Diagnosis not present

## 2019-12-15 ENCOUNTER — Telehealth: Payer: Self-pay

## 2019-12-15 NOTE — Telephone Encounter (Signed)
Patient complains of body aches, headache, cough and fever that started over the weekend.  Patient states that she has had a negative COVID test and would like to come in to see MMM.  Advised patient that she is having COVID symptoms and there can be false negative test and that she would either need to be seen in the [facility] or do a tele/video visit.  Appt made for televisit with Mary-Margaret Hassell Done, FNP Tuesday 12/16/19 at 12:30 advised patient she may be contacted earlier or later than appointment time

## 2019-12-15 NOTE — Telephone Encounter (Signed)
  Incoming Patient Call  12/15/2019  What symptoms do you have? Aches, headache low grade fever  How long have you been sick? Since sat  Have you been seen for this problem? No, she had covid test and negative   If your provider decides to give you a prescription, which pharmacy would you like for it to be sent to? [facility]    Patient informed that this information will be sent to the clinical staff for review and that they should receive a follow up call.

## 2019-12-16 ENCOUNTER — Ambulatory Visit (INDEPENDENT_AMBULATORY_CARE_PROVIDER_SITE_OTHER): Payer: Medicare HMO | Admitting: Nurse Practitioner

## 2019-12-16 ENCOUNTER — Encounter: Payer: Self-pay | Admitting: Nurse Practitioner

## 2019-12-16 DIAGNOSIS — M791 Myalgia, unspecified site: Secondary | ICD-10-CM | POA: Diagnosis not present

## 2019-12-16 DIAGNOSIS — R5081 Fever presenting with conditions classified elsewhere: Secondary | ICD-10-CM | POA: Diagnosis not present

## 2019-12-16 DIAGNOSIS — R509 Fever, unspecified: Secondary | ICD-10-CM

## 2019-12-16 DIAGNOSIS — R059 Cough, unspecified: Secondary | ICD-10-CM

## 2019-12-16 NOTE — Addendum Note (Signed)
Addended by: Rigoberto Noel C on: 12/16/2019 10:59 AM   Modules accepted: Orders

## 2019-12-16 NOTE — Progress Notes (Signed)
   Virtual Visit via telephone Note Due to COVID-19 pandemic this visit was conducted virtually. This visit type was conducted due to national recommendations for restrictions regarding the COVID-19 Pandemic (e.g. social distancing, sheltering in place) in an effort to limit this patient's exposure and mitigate transmission in our community. All issues noted in this document were discussed and addressed.  A physical exam was not performed with this format.  I connected with Nicole Wheeler on 12/16/19 at 9:05 by telephone and verified that I am speaking with the correct person using two identifiers. Nicole Wheeler is currently located at home and no one is currently with her during visit. The provider, Mary-Margaret Hassell Done, FNP is located in their office at time of visit.  I discussed the limitations, risks, security and privacy concerns of performing an evaluation and management service by telephone and the availability of in person appointments. I also discussed with the patient that there may be a patient responsible charge related to this service. The patient expressed understanding and agreed to proceed.   History and Present Illness:   Chief Complaint: URI   HPI Patient calls in c/o body aches, cough, and congestion that started on Thursday. She went to [facility] for covid testing on Sunday afternoon but her test came back negative yesterday. She still feels bad. Still running a fever and body aches.    Review of Systems  Constitutional: Negative for diaphoresis and weight loss.  Eyes: Negative for blurred vision, double vision and pain.  Respiratory: Negative for shortness of breath.   Cardiovascular: Negative for chest pain, palpitations, orthopnea and leg swelling.  Gastrointestinal: Negative for abdominal pain.  Skin: Negative for rash.  Neurological: Negative for dizziness, sensory change, loss of consciousness, weakness and headaches.  Endo/Heme/Allergies: Negative for  polydipsia. Does not bruise/bleed easily.  Psychiatric/Behavioral: Negative for memory loss. The patient does not have insomnia.   All other systems reviewed and are negative.    Observations/Objective: Alert and oriented- answers all questions appropriately No distress    Assessment and Plan: Nicole Wheeler in today with chief complaint of URI   1. Fever in other diseases  - Novel Coronavirus, NAA ([facility]); Future - Veritor Flu A/B Waived; Future  2. Cough   3. Myalgia  treat symptoms- motrin or Tylenol OTC for fever and body aches Force fluids Rest OTC cough  meds as needed  covid education provided- quarantine until test results are back    Follow Up Instructions: prn   I discussed the assessment and treatment plan with the patient. The patient was provided an opportunity to ask questions and all were answered. The patient agreed with the plan and demonstrated an understanding of the instructions.   The patient was advised to call back or seek an in-person evaluation if the symptoms worsen or if the condition fails to improve as anticipated.  The above assessment and management plan was discussed with the patient. The patient verbalized understanding of and has agreed to the management plan. Patient is aware to call the clinic if symptoms persist or worsen. Patient is aware when to return to the clinic for a follow-up visit. Patient educated on when it is appropriate to go to the emergency department.   Time call ended:  9:25  I provided  20 minutes of non-face-to-face time during this encounter.    Mary-Margaret Hassell Done, FNP

## 2019-12-17 LAB — SARS-COV-2, NAA 2 DAY TAT

## 2019-12-17 LAB — NOVEL CORONAVIRUS, NAA: SARS-CoV-2, NAA: NOT DETECTED

## 2019-12-18 ENCOUNTER — Telehealth: Payer: Self-pay

## 2019-12-18 NOTE — Telephone Encounter (Signed)
Patient aware and verbalized understanding. °

## 2020-01-05 DIAGNOSIS — H0102B Squamous blepharitis left eye, upper and lower eyelids: Secondary | ICD-10-CM | POA: Diagnosis not present

## 2020-01-05 DIAGNOSIS — H401132 Primary open-angle glaucoma, bilateral, moderate stage: Secondary | ICD-10-CM | POA: Diagnosis not present

## 2020-01-05 DIAGNOSIS — H0102A Squamous blepharitis right eye, upper and lower eyelids: Secondary | ICD-10-CM | POA: Diagnosis not present

## 2020-02-17 DIAGNOSIS — H401132 Primary open-angle glaucoma, bilateral, moderate stage: Secondary | ICD-10-CM | POA: Diagnosis not present

## 2020-02-24 DIAGNOSIS — L03032 Cellulitis of left toe: Secondary | ICD-10-CM | POA: Diagnosis not present

## 2020-02-24 DIAGNOSIS — M79676 Pain in unspecified toe(s): Secondary | ICD-10-CM | POA: Diagnosis not present

## 2020-03-09 DIAGNOSIS — L03032 Cellulitis of left toe: Secondary | ICD-10-CM | POA: Diagnosis not present

## 2020-03-09 DIAGNOSIS — M79675 Pain in left toe(s): Secondary | ICD-10-CM | POA: Diagnosis not present

## 2020-04-01 ENCOUNTER — Other Ambulatory Visit: Payer: Self-pay

## 2020-04-01 ENCOUNTER — Encounter: Payer: Self-pay | Admitting: Nurse Practitioner

## 2020-04-01 ENCOUNTER — Ambulatory Visit (INDEPENDENT_AMBULATORY_CARE_PROVIDER_SITE_OTHER): Payer: Medicare HMO | Admitting: Nurse Practitioner

## 2020-04-01 VITALS — BP 137/75 | HR 63 | Temp 98.1°F | Resp 20 | Ht 60.0 in | Wt 159.0 lb

## 2020-04-01 DIAGNOSIS — I10 Essential (primary) hypertension: Secondary | ICD-10-CM | POA: Diagnosis not present

## 2020-04-01 DIAGNOSIS — E039 Hypothyroidism, unspecified: Secondary | ICD-10-CM

## 2020-04-01 DIAGNOSIS — E7800 Pure hypercholesterolemia, unspecified: Secondary | ICD-10-CM | POA: Diagnosis not present

## 2020-04-01 DIAGNOSIS — Z6829 Body mass index (BMI) 29.0-29.9, adult: Secondary | ICD-10-CM | POA: Diagnosis not present

## 2020-04-01 DIAGNOSIS — M8580 Other specified disorders of bone density and structure, unspecified site: Secondary | ICD-10-CM

## 2020-04-01 MED ORDER — LOSARTAN POTASSIUM 100 MG PO TABS: 100.0000 mg | ORAL_TABLET | Freq: Every day | ORAL | 1 refills | Status: DC

## 2020-04-01 MED ORDER — HYDROCHLOROTHIAZIDE 25 MG PO TABS: 25.0000 mg | ORAL_TABLET | Freq: Every day | ORAL | 1 refills | Status: DC

## 2020-04-01 MED ORDER — LEVOTHYROXINE SODIUM 100 MCG PO TABS: 100.0000 ug | ORAL_TABLET | Freq: Every day | ORAL | 1 refills | Status: DC

## 2020-04-01 NOTE — Patient Instructions (Signed)
Fall Prevention in the Home, Adult Falls can cause injuries and can happen to people of all ages. There are many things you can do to make your home safe and to help prevent falls. Ask for help when making these changes. What actions can I take to prevent falls? General Instructions  Use good lighting in all rooms. Replace any light bulbs that burn out.  Turn on the lights in dark areas. Use night-lights.  Keep items that you use often in easy-to-reach places. Lower the shelves around your home if needed.  Set up your furniture so you have a clear path. Avoid moving your furniture around.  Do not have throw rugs or other things on the floor that can make you trip.  Avoid walking on wet floors.  If any of your floors are uneven, fix them.  Add color or contrast paint or tape to clearly mark and help you see: ? Grab bars or handrails. ? First and last steps of staircases. ? Where the edge of each step is.  If you use a stepladder: ? Make sure that it is fully opened. Do not climb a closed stepladder. ? Make sure the sides of the stepladder are locked in place. ? Ask someone to hold the stepladder while you use it.  Know where your pets are when moving through your home. What can I do in the bathroom?  Keep the floor dry. Clean up any water on the floor right away.  Remove soap buildup in the tub or shower.  Use nonskid mats or decals on the floor of the tub or shower.  Attach bath mats securely with double-sided, nonslip rug tape.  If you need to sit down in the shower, use a plastic, nonslip stool.  Install grab bars by the toilet and in the tub and shower. Do not use towel bars as grab bars.      What can I do in the bedroom?  Make sure that you have a light by your bed that is easy to reach.  Do not use any sheets or blankets for your bed that hang to the floor.  Have a firm chair with side arms that you can use for support when you get dressed. What can I do in  the kitchen?  Clean up any spills right away.  If you need to reach something above you, use a step stool with a grab bar.  Keep electrical cords out of the way.  Do not use floor polish or wax that makes floors slippery. What can I do with my stairs?  Do not leave any items on the stairs.  Make sure that you have a light switch at the top and the bottom of the stairs.  Make sure that there are handrails on both sides of the stairs. Fix handrails that are broken or loose.  Install nonslip stair treads on all your stairs.  Avoid having throw rugs at the top or bottom of the stairs.  Choose a carpet that does not hide the edge of the steps on the stairs.  Check carpeting to make sure that it is firmly attached to the stairs. Fix carpet that is loose or worn. What can I do on the outside of my home?  Use bright outdoor lighting.  Fix the edges of walkways and driveways and fix any cracks.  Remove anything that might make you trip as you walk through a door, such as a raised step or threshold.  Trim any   bushes or trees on paths to your home.  Check to see if handrails are loose or broken and that both sides of all steps have handrails.  Install guardrails along the edges of any raised decks and porches.  Clear paths of anything that can make you trip, such as tools or rocks.  Have leaves, snow, or ice cleared regularly.  Use sand or salt on paths during winter.  Clean up any spills in your garage right away. This includes grease or oil spills. What other actions can I take?  Wear shoes that: ? Have a low heel. Do not wear high heels. ? Have rubber bottoms. ? Feel good on your feet and fit well. ? Are closed at the toe. Do not wear open-toe sandals.  Use tools that help you move around if needed. These include: ? Canes. ? Walkers. ? Scooters. ? Crutches.  Review your medicines with your doctor. Some medicines can make you feel dizzy. This can increase your chance  of falling. Ask your doctor what else you can do to help prevent falls. Where to find more information  Centers for Disease Control and Prevention, STEADI: www.cdc.gov  National Institute on Aging: www.nia.nih.gov Contact a doctor if:  You are afraid of falling at home.  You feel weak, drowsy, or dizzy at home.  You fall at home. Summary  There are many simple things that you can do to make your home safe and to help prevent falls.  Ways to make your home safe include removing things that can make you trip and installing grab bars in the bathroom.  Ask for help when making these changes in your home. This information is not intended to replace advice given to you by your health care provider. Make sure you discuss any questions you have with your health care provider. Document Revised: 10/01/2019 Document Reviewed: 10/01/2019 Elsevier Patient Education  2021 Elsevier Inc.  

## 2020-04-01 NOTE — Progress Notes (Signed)
Subjective:    Patient ID: Nicole Wheeler, female    DOB: November 24, 1930, 87 y.o.   MRN: [id number]   Chief Complaint: medical management of chronic issues     HPI:  1. Primary hypertension No c/o chest pain, SOB or headaches. Does  check blood pressure at home. Usually runs above 342 systolic in the mornings. Not sure what it would be after taking her meds. BP Readings from Last 3 Encounters:  04/01/20 137/75  09/29/19 (!) 151/71  03/24/19 (!) 158/74    2. Pure hypercholesterolemia Does try to watch diet but does very little exercise. Lab Results  Component Value Date   CHOL 216 (H) 09/29/2019   HDL 43 09/29/2019   LDLCALC 146 (H) 09/29/2019   TRIG 149 09/29/2019   CHOLHDL 5.0 (H) 09/29/2019     3. Hypothyroidism, unspecified type Sh eis having no problem sthat she is aware of. Lab Results  Component Value Date   TSH 1.290 06/27/2019     4. Osteopenia, unspecified location Last dexascan was done 12/29/15 t score was -2.3. will repeat today  5. BMI 29.0-29.9,adult No recent weight changes Wt Readings from Last 3 Encounters:  04/01/20 159 lb (72.1 kg)  09/29/19 160 lb (72.6 kg)  03/24/19 164 lb (74.4 kg)   BMI Readings from Last 3 Encounters:  04/01/20 31.05 kg/m  09/29/19 31.25 kg/m  03/24/19 32.03 kg/m       Outpatient Encounter Medications as of 04/01/2020  Medication Sig  . aspirin EC 81 MG tablet Take 81 mg by mouth daily.  . Cholecalciferol (VITAMIN D) 2000 units CAPS Take 1,000 Units by mouth daily.  . diphenhydramine-acetaminophen (TYLENOL PM) 25-500 MG TABS tablet Take 0.5 tablets by mouth at bedtime as needed.   . dorzolamide-timolol (COSOPT) 22.3-6.8 MG/ML ophthalmic solution   . fish oil-omega-3 fatty acids 1000 MG capsule Take 1 capsule by mouth daily.  . hydrochlorothiazide (HYDRODIURIL) 25 MG tablet Take 1 tablet (25 mg total) by mouth daily.  Marland Kitchen latanoprost (XALATAN) 0.005 % ophthalmic solution   . levothyroxine (SYNTHROID) 100 MCG  tablet Take 1 tablet (100 mcg total) by mouth daily.  Marland Kitchen losartan (COZAAR) 100 MG tablet Take 1 tablet (100 mg total) by mouth daily.  . meloxicam (MOBIC) 15 MG tablet Take 1 tablet (15 mg total) by mouth daily.  . Multiple Vitamins-Minerals (CENTRUM SILVER PO) Take by mouth.  . Multiple Vitamins-Minerals (PRESERVISION/LUTEIN PO) Take by mouth.  . naproxen sodium (ANAPROX) 220 MG tablet Take 220 mg by mouth daily. Takes at night time daily      Past Surgical History:  Procedure Laterality Date  . APPENDECTOMY    . EYE SURGERY Right    cataract removal  . LUMBAR LAMINECTOMY/DECOMPRESSION MICRODISCECTOMY  12/07/2011   Procedure: LUMBAR LAMINECTOMY/DECOMPRESSION MICRODISCECTOMY 1 LEVEL;  Surgeon: Ophelia Charter, MD;  Location: [facility];  Service: Neurosurgery;  Laterality: Right;  Right Lumbar Five-Sacral One Diskectomy  . throidectomy    . TUBAL LIGATION      Family History  Problem Relation Age of Onset  . Stroke Mother 54  . Hypertension Mother   . Stroke Father 52  . Hypertension Father   . Heart attack Sister 32       heart attack  . Breast cancer Maternal Aunt   . Healthy Daughter   . Healthy Son   . Healthy Son     New complaints: Had some back pain several weeks ago.she took motrin and tylenol an dit is better  now.  Social history: Lives by herself. Her family checks on her daily.  Controlled substance contract: n/a    Review of Systems  Constitutional: Negative for diaphoresis.  Eyes: Negative for pain.  Respiratory: Negative for shortness of breath.   Cardiovascular: Negative for chest pain, palpitations and leg swelling.  Gastrointestinal: Negative for abdominal pain.  Endocrine: Negative for polydipsia.  Skin: Negative for rash.  Neurological: Negative for dizziness, weakness and headaches.  Hematological: Does not bruise/bleed easily.  All other systems reviewed and are negative.      Objective:   Physical Exam Vitals and nursing note  reviewed.  Constitutional:      General: She is not in acute distress.    Appearance: Normal appearance. She is well-developed and well-nourished.  HENT:     Head: Normocephalic.     Nose: Nose normal.     Mouth/Throat:     Mouth: Oropharynx is clear and moist.  Eyes:     Extraocular Movements: EOM normal.     Pupils: Pupils are equal, round, and reactive to light.  Neck:     Vascular: No carotid bruit or JVD.  Cardiovascular:     Rate and Rhythm: Normal rate and regular rhythm.     Pulses: Intact distal pulses.     Heart sounds: Normal heart sounds.  Pulmonary:     Effort: Pulmonary effort is normal. No respiratory distress.     Breath sounds: Normal breath sounds. No wheezing or rales.  Chest:     Chest wall: No tenderness.  Abdominal:     General: Bowel sounds are normal. There is no distension or abdominal bruit. Aorta is normal.     Palpations: Abdomen is soft. There is no hepatomegaly, splenomegaly, mass or pulsatile mass.     Tenderness: There is no abdominal tenderness.  Musculoskeletal:        General: Normal range of motion.     Cervical back: Normal range of motion and neck supple.     Right lower leg: Edema (1+) present.     Left lower leg: Edema (1+) present.  Lymphadenopathy:     Cervical: No cervical adenopathy.  Skin:    General: Skin is warm and dry.  Neurological:     Mental Status: She is alert and oriented to person, place, and time.     Deep Tendon Reflexes: Reflexes are normal and symmetric.  Psychiatric:        Mood and Affect: Mood and affect normal.        Behavior: Behavior normal.        Thought Content: Thought content normal.        Judgment: Judgment normal.    BP 137/75   Pulse 63   Temp 98.1 F (36.7 C) (Temporal)   Resp 20   Ht 5' (1.524 m)   Wt 159 lb (72.1 kg)   SpO2 96%   BMI 31.05 kg/m         Assessment & Plan:  Nicole Wheeler comes in today with chief complaint of Medical Management of Chronic Issues   Diagnosis  and orders addressed:  1. Primary hypertension Low sodium diet - hydrochlorothiazide (HYDRODIURIL) 25 MG tablet; Take 1 tablet (25 mg total) by mouth daily.  Dispense: 90 tablet; Refill: 1 - losartan (COZAAR) 100 MG tablet; Take 1 tablet (100 mg total) by mouth daily.  Dispense: 90 tablet; Refill: 1 - CBC with Differential/Platelet - CMP14+EGFR  2. Pure hypercholesterolemia Low fat diet - Lipid panel  3. Hypothyroidism, unspecified type labs pending - levothyroxine (SYNTHROID) 100 MCG tablet; Take 1 tablet (100 mcg total) by mouth daily.  Dispense: 90 tablet; Refill: 1 - Thyroid Panel With TSH  4. Osteopenia, unspecified location Weight bearing exercises Will discuss dexascan results when we get them - DG [facility] DEXA  5. BMI 29.0-29.9,adult Discussed diet and exercise for person with BMI >25 Will recheck weight in 3-6 months    Labs pending Health Maintenance reviewed Diet and exercise encouraged  Follow up plan: 6 months   Mary-Margaret Hassell Done, FNP

## 2020-04-02 LAB — THYROID PANEL WITH TSH
Free Thyroxine Index: 2.5 (ref 1.2–4.9)
T3 Uptake Ratio: 30 % (ref 24–39)
T4, Total: 8.2 ug/dL (ref 4.5–12.0)
TSH: 0.975 u[IU]/mL (ref 0.450–4.500)

## 2020-04-02 LAB — LIPID PANEL
Chol/HDL Ratio: 4.9 ratio — ABNORMAL HIGH (ref 0.0–4.4)
Cholesterol, Total: 194 mg/dL (ref 100–199)
HDL: 40 mg/dL (ref >39)
LDL Chol Calc (NIH): 133 mg/dL — ABNORMAL HIGH (ref 0–99)
Triglycerides: 118 mg/dL (ref 0–149)
VLDL Cholesterol Cal: 21 mg/dL (ref 5–40)

## 2020-04-02 LAB — CMP14+EGFR
ALT: 16 IU/L (ref 0–32)
AST: 15 IU/L (ref 0–40)
Albumin/Globulin Ratio: 1.5 (ref 1.2–2.2)
Albumin: 3.9 g/dL (ref 3.6–4.6)
Alkaline Phosphatase: 83 IU/L (ref 44–121)
BUN/Creatinine Ratio: 28 (ref 12–28)
BUN: 35 mg/dL — ABNORMAL HIGH (ref 8–27)
Bilirubin Total: <0.2 mg/dL (ref 0.0–1.2)
CO2: 27 mmol/L (ref 20–29)
Calcium: 9.6 mg/dL (ref 8.7–10.3)
Chloride: 102 mmol/L (ref 96–106)
Creatinine, Ser: 1.27 mg/dL — ABNORMAL HIGH (ref 0.57–1.00)
GFR calc Af Amer: 44 mL/min/{1.73_m2} — ABNORMAL LOW (ref >59)
GFR calc non Af Amer: 38 mL/min/{1.73_m2} — ABNORMAL LOW (ref >59)
Globulin, Total: 2.6 g/dL (ref 1.5–4.5)
Glucose: 105 mg/dL — ABNORMAL HIGH (ref 65–99)
Potassium: 4.6 mmol/L (ref 3.5–5.2)
Sodium: 139 mmol/L (ref 134–144)
Total Protein: 6.5 g/dL (ref 6.0–8.5)

## 2020-04-02 LAB — CBC WITH DIFFERENTIAL/PLATELET
Basophils Absolute: 0.1 10*3/uL (ref 0.0–0.2)
Basos: 1 %
EOS (ABSOLUTE): 0.3 10*3/uL (ref 0.0–0.4)
Eos: 4 %
Hematocrit: 34.7 % (ref 34.0–46.6)
Hemoglobin: 11.1 g/dL (ref 11.1–15.9)
Immature Grans (Abs): 0 10*3/uL (ref 0.0–0.1)
Immature Granulocytes: 0 %
Lymphocytes Absolute: 1.6 10*3/uL (ref 0.7–3.1)
Lymphs: 24 %
MCH: 29.5 pg (ref 26.6–33.0)
MCHC: 32 g/dL (ref 31.5–35.7)
MCV: 92 fL (ref 79–97)
Monocytes Absolute: 0.6 10*3/uL (ref 0.1–0.9)
Monocytes: 10 %
Neutrophils Absolute: 4.1 10*3/uL (ref 1.4–7.0)
Neutrophils: 61 %
Platelets: 301 10*3/uL (ref 150–450)
RBC: 3.76 x10E6/uL — ABNORMAL LOW (ref 3.77–5.28)
RDW: 12.7 % (ref 11.7–15.4)
WBC: 6.6 10*3/uL (ref 3.4–10.8)

## 2020-04-20 DIAGNOSIS — H401132 Primary open-angle glaucoma, bilateral, moderate stage: Secondary | ICD-10-CM | POA: Diagnosis not present

## 2020-05-27 ENCOUNTER — Other Ambulatory Visit: Payer: Self-pay

## 2020-05-27 ENCOUNTER — Ambulatory Visit (INDEPENDENT_AMBULATORY_CARE_PROVIDER_SITE_OTHER): Payer: Medicare HMO

## 2020-05-27 DIAGNOSIS — M8588 Other specified disorders of bone density and structure, other site: Secondary | ICD-10-CM

## 2020-05-28 DIAGNOSIS — M85852 Other specified disorders of bone density and structure, left thigh: Secondary | ICD-10-CM | POA: Diagnosis not present

## 2020-05-28 DIAGNOSIS — Z780 Asymptomatic menopausal state: Secondary | ICD-10-CM | POA: Diagnosis not present

## 2020-07-23 ENCOUNTER — Ambulatory Visit: Payer: Medicare HMO

## 2020-07-23 ENCOUNTER — Ambulatory Visit (INDEPENDENT_AMBULATORY_CARE_PROVIDER_SITE_OTHER): Payer: Medicare HMO

## 2020-07-23 VITALS — Ht 60.0 in | Wt 155.0 lb

## 2020-07-23 DIAGNOSIS — Z0000 Encounter for general adult medical examination without abnormal findings: Secondary | ICD-10-CM

## 2020-07-23 DIAGNOSIS — Z23 Encounter for immunization: Secondary | ICD-10-CM | POA: Diagnosis not present

## 2020-07-23 NOTE — Progress Notes (Signed)
Subjective:   Nicole Wheeler is a 87 y.o. female who presents for Medicare Annual (Subsequent) preventive examination.  Virtual Visit via Telephone Note  I connected with  Nicole Wheeler on 07/23/20 at  4:15 PM EDT by telephone and verified that I am speaking with the correct person using two identifiers.  Location: Patient: Home Provider: [facility] Persons participating in the virtual visit: patient/Nurse Health Advisor   I discussed the limitations, risks, security and privacy concerns of performing an evaluation and management service by telephone and the availability of in person appointments. The patient expressed understanding and agreed to proceed.  Interactive audio and video telecommunications were attempted between this nurse and patient, however failed, due to patient having technical difficulties OR patient did not have access to video capability.  We continued and completed visit with audio only.  Some vital signs may be absent or patient reported.   Nicole Wheeler E Judd Mccubbin, LPN   Review of Systems     Cardiac Risk Factors include: advanced age (>32men, >72 women);sedentary lifestyle;obesity (BMI >30kg/m2);dyslipidemia;hypertension     Objective:    Today's Vitals   07/23/20 1614  Weight: 155 lb (70.3 kg)  Height: 5' (1.524 m)   Body mass index is 30.27 kg/m.  Advanced Directives 07/23/2020 06/30/2019 09/20/2017 08/17/2015 09/17/2014 02/09/2014 12/06/2011  Does Patient Have a Medical Advance Directive? Yes Yes Yes Yes Yes Yes Patient has advance directive, copy not in chart  Type of Advance Directive [place];Living will Living will;Healthcare Power of Attorney Living will;Healthcare Power of Attorney Living will;Healthcare Power of Attorney - Living will [place]  Does patient want to make changes to medical advance directive? - - No - Patient declined No - Patient declined - - -  Copy of [place] in Chart? No - copy  requested No - copy requested No - copy requested No - copy requested - - -    Current Medications (verified) Outpatient Encounter Medications as of 07/23/2020  Medication Sig  . aspirin EC 81 MG tablet Take 81 mg by mouth daily.  . brimonidine (ALPHAGAN) 0.15 % ophthalmic solution 1 drop 2 (two) times daily.  . calcium carbonate (OSCAL) 1500 (600 Ca) MG TABS tablet Take by mouth 2 (two) times daily with a meal.  . Cholecalciferol (VITAMIN D) 2000 units CAPS Take 1,000 Units by mouth daily.  . diphenhydramine-acetaminophen (TYLENOL PM) 25-500 MG TABS tablet Take 0.5 tablets by mouth at bedtime as needed.   . dorzolamide (TRUSOPT) 2 % ophthalmic solution dorzolamide 2 % eye drops  INSTILL 1 DROP INTO AFFECTED EYE(S) BY OPHTHALMIC ROUTE 3 TIMES PER DAY  . fish oil-omega-3 fatty acids 1000 MG capsule Take 1 capsule by mouth daily.  . hydrochlorothiazide (HYDRODIURIL) 25 MG tablet Take 1 tablet (25 mg total) by mouth daily.  Marland Kitchen latanoprost (XALATAN) 0.005 % ophthalmic solution   . levothyroxine (SYNTHROID) 100 MCG tablet Take 1 tablet (100 mcg total) by mouth daily.  Marland Kitchen losartan (COZAAR) 100 MG tablet Take 1 tablet (100 mg total) by mouth daily.  . Multiple Vitamins-Minerals (CENTRUM SILVER PO) Take by mouth.  . Multiple Vitamins-Minerals (PRESERVISION/LUTEIN PO) Take by mouth.  . [DISCONTINUED] brimonidine (ALPHAGAN P) 0.1 % SOLN   . [DISCONTINUED] dorzolamide-timolol (COSOPT) 22.3-6.8 MG/ML ophthalmic solution    No facility-administered encounter medications on file as of 07/23/2020.    Allergies (verified) Ace inhibitors, Crestor [rosuvastatin calcium], and Lipitor [atorvastatin]   History: Past Medical History:  Diagnosis Date  .  Arthritis   . Cataract   . Glaucoma   . Hyperlipidemia   . Hypertension   . Hypothyroidism    dr don Laurance Flatten   pcp  . Macular degeneration   . Osteopenia   . Shingles   . Spinal stenosis    Past Surgical History:  Procedure Laterality Date  .  APPENDECTOMY    . EYE SURGERY Right    cataract removal  . LUMBAR LAMINECTOMY/DECOMPRESSION MICRODISCECTOMY  12/07/2011   Procedure: LUMBAR LAMINECTOMY/DECOMPRESSION MICRODISCECTOMY 1 LEVEL;  Surgeon: Ophelia Charter, MD;  Location: [facility];  Service: Neurosurgery;  Laterality: Right;  Right Lumbar [place] One Diskectomy  . throidectomy    . TUBAL LIGATION     Family History  Problem Relation Age of Onset  . Stroke Mother 93  . Hypertension Mother   . Stroke Father 39  . Hypertension Father   . Heart attack Sister 57       heart attack  . Breast cancer Maternal Aunt   . Healthy Daughter   . Healthy Son   . Healthy Son    Social History   Socioeconomic History  . Marital status: Widowed    Spouse name: Not on file  . Number of children: 3  . Years of education: 25  . Highest education level: Some college, no degree  Occupational History  . Occupation: Retired    Comment: Veterinary surgeon work  Tobacco Use  . Smoking status: Former Smoker    Packs/day: 1.00    Years: 25.00    Pack years: 25.00    Types: Cigarettes    Quit date: 03/13/1978    Years since quitting: 42.3  . Smokeless tobacco: Never Used  Vaping Use  . Vaping Use: Never used  Substance and Sexual Activity  . Alcohol use: Yes    Comment: occasional glass of wine  . Drug use: No  . Sexual activity: Never  Other Topics Concern  . Not on file  Social History Narrative   Tahara is retired and lives alone. She is widowed and has 3 grown children. She enjoys sewing, quilting, and word puzzles. She is involved in her church.    Social Determinants of Health   Financial Resource Strain: Low Risk   . Difficulty of Paying Living Expenses: Not hard at all  Food Insecurity: No Food Insecurity  . Worried About Charity fundraiser in the Last Year: Never true  . Ran Out of Food in the Last Year: Never true  Transportation Needs: No Transportation Needs  . Lack of Transportation (Medical): No  . Lack of  Transportation (Non-Medical): No  Physical Activity: Inactive  . Days of Exercise per Week: 0 days  . Minutes of Exercise per Session: 0 min  Stress: No Stress Concern Present  . Feeling of Stress : Not at all  Social Connections: Moderately Integrated  . Frequency of Communication with Friends and Family: More than three times a week  . Frequency of Social Gatherings with Friends and Family: More than three times a week  . Attends Religious Services: More than 4 times per year  . Active Member of Clubs or Organizations: Yes  . Attends Archivist Meetings: More than 4 times per year  . Marital Status: Widowed    Tobacco Counseling Counseling given: Not Answered   Clinical Intake:  Pre-visit preparation completed: Yes  Pain : No/denies pain     BMI - recorded: 30.27 Nutritional Status: BMI > 30  Obese Nutritional  Risks: None Diabetes: No  How often do you need to have someone help you when you read instructions, pamphlets, or other written materials from your doctor or pharmacy?: 1 - Never  Diabetic? no  Interpreter Needed?: No  Information entered by :: Jarrell Armond, LPN   Activities of Daily Living In your present state of health, do you have any difficulty performing the following activities: 07/23/2020  Hearing? N  Vision? N  Difficulty concentrating or making decisions? N  Walking or climbing stairs? N  Dressing or bathing? N  Doing errands, shopping? N  Preparing Food and eating ? N  Using the Toilet? N  In the past six months, have you accidently leaked urine? Y  Do you have problems with loss of bowel control? N  Managing your Medications? N  Managing your Finances? N  Housekeeping or managing your Housekeeping? N  Some recent data might be hidden    Patient Care Team: Chevis Pretty, FNP as PCP - General (Nurse Practitioner) Newman Pies, MD as Consulting Physician (Neurosurgery) Clent Jacks, MD as Consulting Physician  (Ophthalmology)  Indicate any recent Medical Services you may have received from other than [facility] providers in the past year (date may be approximate).     Assessment:   This is a routine wellness examination for Latrina.  Hearing/Vision screen  Hearing Screening   125Hz  250Hz  500Hz  1000Hz  2000Hz  3000Hz  4000Hz  6000Hz  8000Hz   Right ear:           Left ear:           Comments: Denies hearing difficulties    Vision Screening Comments: Annual visits with Dr Katy Fitch - up to date with eye exams - wears eyeglasses   Dietary issues and exercise activities discussed: Current Exercise Habits: The patient does not participate in regular exercise at present, Exercise limited by: orthopedic condition(s)  Goals Addressed            This Visit's Progress   . Exercise 150 min/wk Moderate Activity   Not on track     Depression Screen PHQ 2/9 Scores 07/23/2020 07/23/2020 04/01/2020 09/29/2019 06/30/2019 06/24/2019 03/24/2019  PHQ - 2 Score 0 0 0 0 0 0 0  PHQ- 9 Score - - - - - - -    Fall Risk Fall Risk  07/23/2020 04/01/2020 09/29/2019 06/30/2019 06/24/2019  Falls in the past year? 0 0 0 0 0  Comment - - - - -  Number falls in past yr: 0 - - - -  Injury with Fall? 0 - - - -  Risk for fall due to : Orthopedic patient - - - -  Follow up Falls prevention discussed - - - -    FALL [place]:  Any stairs in or around the home? Yes  If so, are there any without handrails? No  Home free of loose throw rugs in walkways, pet beds, electrical cords, etc? Yes  Adequate lighting in your home to reduce risk of falls? Yes   ASSISTIVE DEVICES UTILIZED TO PREVENT FALLS:  Life alert? No  Use of a cane, walker or w/c? Yes  Grab bars in the bathroom? Yes  Shower chair or bench in shower? Yes  Elevated toilet seat or a handicapped toilet? Yes   TIMED UP AND GO:  Was the test performed? No . Telephonic visit.  Cognitive Function: Normal cognitive status assessed by direct  observation by this Nurse Health Advisor. No abnormalities found.    MMSE -  Mini Mental State Exam 09/20/2017 09/19/2016 08/17/2015 08/17/2015  Orientation to time 5 5 5 5   Orientation to Place 5 5 5 5   Registration 3 3 3  -  Attention/ Calculation 5 5 5  -  Recall 3 3 2  -  Language- name 2 objects 2 2 2  -  Language- repeat 1 1 1  -  Language- follow 3 step command 3 3 3  -  Language- read & follow direction 1 1 1  -  Write a sentence 1 1 1  -  Copy design 1 1 1  -  Total score 30 30 29  -     6CIT Screen 06/30/2019  What Year? 0 points  What month? 0 points  What time? 0 points  Count back from 20 0 points  Months in reverse 0 points  Repeat phrase 0 points  Total Score 0    Immunizations Immunization History  Administered Date(s) Administered  . Influenza Split 12/08/2011, 12/14/2012  . Influenza, High Dose Seasonal PF 01/08/2014  . Influenza,inj,Quad PF,6+ Mos 12/11/2016  . Influenza-Unspecified 01/13/2015, 01/01/2018, 12/16/2018, 01/21/2020  . PFIZER(Purple Top)SARS-COV-2 Vaccination 04/04/2019, 04/25/2019, 01/10/2020, 07/23/2020  . Pneumococcal Conjugate-13 06/15/2014  . Pneumococcal Polysaccharide-23 03/13/1998  . Tdap 12/08/2010    TDAP status: Up to date  Flu Vaccine status: Up to date  Pneumococcal vaccine status: Up to date  Covid-19 vaccine status: Completed vaccines  Qualifies for Shingles Vaccine? Yes   Zostavax completed Yes   Shingrix Completed?: No.    Education has been provided regarding the importance of this vaccine. Patient has been advised to call insurance company to determine out of pocket expense if they have not yet received this vaccine. Advised may also receive vaccine at local pharmacy or Health Dept. Verbalized acceptance and understanding.  Screening Tests Health Maintenance  Topic Date Due  . MAMMOGRAM  09/29/2020  . INFLUENZA VACCINE  10/11/2020  . TETANUS/TDAP  12/07/2020  . DEXA SCAN  05/29/2022  . COVID-19 Vaccine  Completed  . PNA vac  Low Risk Adult  Completed  . HPV VACCINES  Aged Out    Health Maintenance  There are no preventive care reminders to display for this patient.  Colorectal cancer screening: No longer required.    Mammogram status: Completed 09/30/2019. Repeat every year  Bone Density status: Completed 05/28/20. Results reflect: Bone density results: OSTEOPENIA. Repeat every 2 years.  Lung Cancer Screening: (Low Dose CT Chest recommended if Age 98-80 years, 30 pack-year currently smoking OR have quit w/in 15years.) does not qualify.   Additional Screening:  Hepatitis C Screening: does not qualify  Vision Screening: Recommended annual ophthalmology exams for early detection of glaucoma and other disorders of the eye. Is the patient up to date with their annual eye exam?  Yes  Who is the provider or what is the name of the office in which the patient attends annual eye exams? [facility] If pt is not established with a provider, would they like to be referred to a provider to establish care? No .   Dental Screening: Recommended annual dental exams for proper oral hygiene  Community Resource Referral / Chronic Care Management: CRR required this visit?  No   CCM required this visit?  No      Plan:     I have personally reviewed and noted the following in the patient's chart:   . Medical and social history . Use of alcohol, tobacco or illicit drugs  . Current medications and supplements including opioid prescriptions.  . Functional ability and status .  Nutritional status . Physical activity . Advanced directives . List of other physicians . Hospitalizations, surgeries, and ER visits in previous 12 months . Vitals . Screenings to include cognitive, depression, and falls . Referrals and appointments  In addition, I have reviewed and discussed with patient certain preventive protocols, quality metrics, and best practice recommendations. A written personalized care plan for preventive services as  well as general preventive health recommendations were provided to patient.     Sandrea Hammond, LPN   [id number]   Nurse Notes: None

## 2020-07-23 NOTE — Patient Instructions (Signed)
Nicole Wheeler , Thank you for taking time to come for your Medicare Wellness Visit. I appreciate your ongoing commitment to your health goals. Please review the following plan we discussed and let me know if I can assist you in the future.   Screening recommendations/referrals: Colonoscopy: no longer required Mammogram: Done 09/30/2019 - repeat every year Bone Density: Done 05/28/20 - Repeat every 2 years Recommended yearly ophthalmology/optometry visit for glaucoma screening and checkup Recommended yearly dental visit for hygiene and checkup  Vaccinations: Influenza vaccine: Done 01/21/2020 - Repeat annually Pneumococcal vaccine: Done 03/1998 and 06/2014 Tdap vaccine: Done 12/08/2010 - Repeat in 10 years Shingles vaccine: DUE   Covid-19: Done 04/04/19, 04/25/19, 01/10/20, & 07/23/20  Advanced directives: Please bring a copy of your health care power of attorney and living will to the office to be added to your chart at your convenience.  Conditions/risks identified: Aim for 30 minutes of exercise or brisk walking each day, drink 6-8 glasses of water and eat lots of fruits and vegetables.  Next appointment: Follow up in one year for your annual wellness visit    Preventive Care 65 Years and Older, Female Preventive care refers to lifestyle choices and visits with your health care provider that can promote health and wellness. What does preventive care include?  A yearly physical exam. This is also called an annual well check.  Dental exams once or twice a year.  Routine eye exams. Ask your health care provider how often you should have your eyes checked.  Personal lifestyle choices, including:  Daily care of your teeth and gums.  Regular physical activity.  Eating a healthy diet.  Avoiding tobacco and drug use.  Limiting alcohol use.  Practicing safe sex.  Taking low-dose aspirin every day.  Taking vitamin and mineral supplements as recommended by your health care  provider. What happens during an annual well check? The services and screenings done by your health care provider during your annual well check will depend on your age, overall health, lifestyle risk factors, and family history of disease. Counseling  Your health care provider may ask you questions about your:  Alcohol use.  Tobacco use.  Drug use.  Emotional well-being.  Home and relationship well-being.  Sexual activity.  Eating habits.  History of falls.  Memory and ability to understand (cognition).  Work and work Statistician.  Reproductive health. Screening  You may have the following tests or measurements:  Height, weight, and BMI.  Blood pressure.  Lipid and cholesterol levels. These may be checked every 5 years, or more frequently if you are over 69 years old.  Skin check.  Lung cancer screening. You may have this screening every year starting at age 40 if you have a 30-pack-year history of smoking and currently smoke or have quit within the past 15 years.  Fecal occult blood test (FOBT) of the stool. You may have this test every year starting at age 23.  Flexible sigmoidoscopy or colonoscopy. You may have a sigmoidoscopy every 5 years or a colonoscopy every 10 years starting at age 56.  Hepatitis C blood test.  Hepatitis B blood test.  Sexually transmitted disease (STD) testing.  Diabetes screening. This is done by checking your blood sugar (glucose) after you have not eaten for a while (fasting). You may have this done every 1-3 years.  Bone density scan. This is done to screen for osteoporosis. You may have this done starting at age 51.  Mammogram. This may be done every 1-2  years. Talk to your health care provider about how often you should have regular mammograms. Talk with your health care provider about your test results, treatment options, and if necessary, the need for more tests. Vaccines  Your health care provider may recommend certain  vaccines, such as:  Influenza vaccine. This is recommended every year.  Tetanus, diphtheria, and acellular pertussis (Tdap, Td) vaccine. You may need a Td booster every 10 years.  Zoster vaccine. You may need this after age 92.  Pneumococcal 13-valent conjugate (PCV13) vaccine. One dose is recommended after age 45.  Pneumococcal polysaccharide (PPSV23) vaccine. One dose is recommended after age 60. Talk to your health care provider about which screenings and vaccines you need and how often you need them. This information is not intended to replace advice given to you by your health care provider. Make sure you discuss any questions you have with your health care provider. Document Released: 03/26/2015 Document Revised: 11/17/2015 Document Reviewed: 12/29/2014 Elsevier Interactive Patient Education  2017 [place] Prevention in the Home Falls can cause injuries. They can happen to people of all ages. There are many things you can do to make your home safe and to help prevent falls. What can I do on the outside of my home?  Regularly fix the edges of walkways and driveways and fix any cracks.  Remove anything that might make you trip as you walk through a door, such as a raised step or threshold.  Trim any bushes or trees on the path to your home.  Use bright outdoor lighting.  Clear any walking paths of anything that might make someone trip, such as rocks or tools.  Regularly check to see if handrails are loose or broken. Make sure that both sides of any steps have handrails.  Any raised decks and porches should have guardrails on the edges.  Have any leaves, snow, or ice cleared regularly.  Use sand or salt on walking paths during winter.  Clean up any spills in your garage right away. This includes oil or grease spills. What can I do in the bathroom?  Use night lights.  Install grab bars by the toilet and in the tub and shower. Do not use towel bars as grab  bars.  Use non-skid mats or decals in the tub or shower.  If you need to sit down in the shower, use a plastic, non-slip stool.  Keep the floor dry. Clean up any water that spills on the floor as soon as it happens.  Remove soap buildup in the tub or shower regularly.  Attach bath mats securely with double-sided non-slip rug tape.  Do not have throw rugs and other things on the floor that can make you trip. What can I do in the bedroom?  Use night lights.  Make sure that you have a light by your bed that is easy to reach.  Do not use any sheets or blankets that are too big for your bed. They should not hang down onto the floor.  Have a firm chair that has side arms. You can use this for support while you get dressed.  Do not have throw rugs and other things on the floor that can make you trip. What can I do in the kitchen?  Clean up any spills right away.  Avoid walking on wet floors.  Keep items that you use a lot in easy-to-reach places.  If you need to reach something above you, use a strong step  stool that has a grab bar.  Keep electrical cords out of the way.  Do not use floor polish or wax that makes floors slippery. If you must use wax, use non-skid floor wax.  Do not have throw rugs and other things on the floor that can make you trip. What can I do with my stairs?  Do not leave any items on the stairs.  Make sure that there are handrails on both sides of the stairs and use them. Fix handrails that are broken or loose. Make sure that handrails are as long as the stairways.  Check any carpeting to make sure that it is firmly attached to the stairs. Fix any carpet that is loose or worn.  Avoid having throw rugs at the top or bottom of the stairs. If you do have throw rugs, attach them to the floor with carpet tape.  Make sure that you have a light switch at the top of the stairs and the bottom of the stairs. If you do not have them, ask someone to add them for  you. What else can I do to help prevent falls?  Wear shoes that:  Do not have high heels.  Have rubber bottoms.  Are comfortable and fit you well.  Are closed at the toe. Do not wear sandals.  If you use a stepladder:  Make sure that it is fully opened. Do not climb a closed stepladder.  Make sure that both sides of the stepladder are locked into place.  Ask someone to hold it for you, if possible.  Clearly mark and make sure that you can see:  Any grab bars or handrails.  First and last steps.  Where the edge of each step is.  Use tools that help you move around (mobility aids) if they are needed. These include:  Canes.  Walkers.  Scooters.  Crutches.  Turn on the lights when you go into a dark area. Replace any light bulbs as soon as they burn out.  Set up your furniture so you have a clear path. Avoid moving your furniture around.  If any of your floors are uneven, fix them.  If there are any pets around you, be aware of where they are.  Review your medicines with your doctor. Some medicines can make you feel dizzy. This can increase your chance of falling. Ask your doctor what other things that you can do to help prevent falls. This information is not intended to replace advice given to you by your health care provider. Make sure you discuss any questions you have with your health care provider. Document Released: 12/24/2008 Document Revised: 08/05/2015 Document Reviewed: 04/03/2014 Elsevier Interactive Patient Education  2017 [employer].

## 2020-07-30 DIAGNOSIS — H353132 Nonexudative age-related macular degeneration, bilateral, intermediate dry stage: Secondary | ICD-10-CM | POA: Diagnosis not present

## 2020-07-30 DIAGNOSIS — H401132 Primary open-angle glaucoma, bilateral, moderate stage: Secondary | ICD-10-CM | POA: Diagnosis not present

## 2020-07-30 DIAGNOSIS — H02834 Dermatochalasis of left upper eyelid: Secondary | ICD-10-CM | POA: Diagnosis not present

## 2020-07-30 DIAGNOSIS — H0102B Squamous blepharitis left eye, upper and lower eyelids: Secondary | ICD-10-CM | POA: Diagnosis not present

## 2020-07-30 DIAGNOSIS — H02831 Dermatochalasis of right upper eyelid: Secondary | ICD-10-CM | POA: Diagnosis not present

## 2020-07-30 DIAGNOSIS — Z961 Presence of intraocular lens: Secondary | ICD-10-CM | POA: Diagnosis not present

## 2020-07-30 DIAGNOSIS — H04123 Dry eye syndrome of bilateral lacrimal glands: Secondary | ICD-10-CM | POA: Diagnosis not present

## 2020-07-30 DIAGNOSIS — H0102A Squamous blepharitis right eye, upper and lower eyelids: Secondary | ICD-10-CM | POA: Diagnosis not present

## 2020-08-11 ENCOUNTER — Ambulatory Visit (INDEPENDENT_AMBULATORY_CARE_PROVIDER_SITE_OTHER): Payer: Medicare HMO | Admitting: Physician Assistant

## 2020-08-11 ENCOUNTER — Encounter: Payer: Self-pay | Admitting: Physician Assistant

## 2020-08-11 ENCOUNTER — Other Ambulatory Visit: Payer: Self-pay

## 2020-08-11 DIAGNOSIS — Z1283 Encounter for screening for malignant neoplasm of skin: Secondary | ICD-10-CM | POA: Diagnosis not present

## 2020-08-11 DIAGNOSIS — Z85828 Personal history of other malignant neoplasm of skin: Secondary | ICD-10-CM | POA: Diagnosis not present

## 2020-08-11 DIAGNOSIS — R21 Rash and other nonspecific skin eruption: Secondary | ICD-10-CM

## 2020-08-11 DIAGNOSIS — D485 Neoplasm of uncertain behavior of skin: Secondary | ICD-10-CM

## 2020-08-11 DIAGNOSIS — L820 Inflamed seborrheic keratosis: Secondary | ICD-10-CM | POA: Diagnosis not present

## 2020-08-11 DIAGNOSIS — L570 Actinic keratosis: Secondary | ICD-10-CM

## 2020-08-11 MED ORDER — TRIAMCINOLONE ACETONIDE 0.1 % EX CREA: 1.0000 "application " | TOPICAL_CREAM | Freq: Two times a day (BID) | CUTANEOUS | 3 refills | Status: AC | PRN

## 2020-08-11 NOTE — Progress Notes (Signed)
Follow-Up Visit   Subjective  Nicole Wheeler is a 87 y.o. female who presents for the following: Annual Exam (Left outer back itches new, lower legs nonhealing lesions, arms small spots 6 months to a year. Flared with sun exposure. Patient has h/o scc x2).   The following portions of the chart were reviewed this encounter and updated as appropriate:  Tobacco  Allergies  Meds  Problems  Med Hx  Surg Hx  Fam Hx      Objective  Well appearing patient in no apparent distress; mood and affect are within normal limits.  A full examination was performed including scalp, head, eyes, ears, nose, lips, neck, chest, axillae, abdomen, back, buttocks, bilateral upper extremities, bilateral lower extremities, hands, feet, fingers, toes, fingernails, and toenails. All findings within normal limits unless otherwise noted below.  Objective  Chest , arms, legs: Small pink patches   Objective  Left Upper Arm - Anterior (5), Mid Root of Nose, Right Upper Arm - Anterior (5): Erythematous patches with gritty scale.  Objective  Mid Forehead: ulceration     Objective  Left Side: Skin toned crust     Objective  Left FLank: Brown crust      Assessment & Plan  Rash and other nonspecific skin eruption Chest , arms, legs  triamcinolone cream (KENALOG) 0.1 % - Chest , arms, legs  AK (actinic keratosis) (11) Left Upper Arm - Anterior (5); Right Upper Arm - Anterior (5); Mid Root of Nose  Destruction of lesion - Left Upper Arm - Anterior, Mid Root of Nose, Right Upper Arm - Anterior Complexity: simple   Destruction method: cryotherapy   Informed consent: discussed and consent obtained   Timeout:  patient name, date of birth, surgical site, and procedure verified Lesion destroyed using liquid nitrogen: Yes   Cryotherapy cycles:  3 Outcome: patient tolerated procedure well with no complications    Neoplasm of uncertain behavior of skin (3) Mid Forehead  Skin / nail  biopsy Type of biopsy: tangential   Informed consent: discussed and consent obtained   Timeout: patient name, date of birth, surgical site, and procedure verified   Procedure prep:  Patient was prepped and draped in usual sterile fashion (Non sterile) Prep type:  Chlorhexidine Anesthesia: the lesion was anesthetized in a standard fashion   Anesthetic:  1% lidocaine w/ epinephrine 1-100,000 local infiltration Instrument used: flexible razor blade   Outcome: patient tolerated procedure well   Post-procedure details: wound care instructions given    Specimen 1 - Surgical pathology Differential Diagnosis: bcc vs scc  Check Margins: No  Left Side  Skin / nail biopsy Type of biopsy: tangential   Informed consent: discussed and consent obtained   Timeout: patient name, date of birth, surgical site, and procedure verified   Procedure prep:  Patient was prepped and draped in usual sterile fashion (Non sterile) Prep type:  Chlorhexidine Anesthesia: the lesion was anesthetized in a standard fashion   Anesthetic:  1% lidocaine w/ epinephrine 1-100,000 local infiltration Instrument used: flexible razor blade   Outcome: patient tolerated procedure well   Post-procedure details: wound care instructions given    Specimen 2 - Surgical pathology Differential Diagnosis: r/o sk     Check Margins: No  Left FLank  Skin / nail biopsy Type of biopsy: tangential   Informed consent: discussed and consent obtained   Timeout: patient name, date of birth, surgical site, and procedure verified   Procedure prep:  Patient was prepped and draped in  usual sterile fashion (Non sterile) Prep type:  Chlorhexidine Anesthesia: the lesion was anesthetized in a standard fashion   Anesthetic:  1% lidocaine w/ epinephrine 1-100,000 local infiltration Instrument used: flexible razor blade   Outcome: patient tolerated procedure well   Post-procedure details: wound care instructions given    Specimen 3 - Surgical  pathology Differential Diagnosis: r/o sk  Check Margins: No    I, Danely Bayliss, PA-C, have reviewed all documentation's for this visit.  The documentation on 08/11/20 for the exam, diagnosis, procedures and orders are all accurate and complete.

## 2020-08-19 ENCOUNTER — Other Ambulatory Visit: Payer: Self-pay | Admitting: Nurse Practitioner

## 2020-08-19 DIAGNOSIS — Z1231 Encounter for screening mammogram for malignant neoplasm of breast: Secondary | ICD-10-CM

## 2020-09-06 ENCOUNTER — Telehealth: Payer: Self-pay | Admitting: Physician Assistant

## 2020-09-06 NOTE — Telephone Encounter (Signed)
Patient is calling for pathology results from last visit with [facility], PA-C.  Per patient it is okay to leave detailed message on home answering machine.

## 2020-09-06 NOTE — Telephone Encounter (Signed)
Path to patient no further work is needed at this time

## 2020-09-10 DIAGNOSIS — H0102B Squamous blepharitis left eye, upper and lower eyelids: Secondary | ICD-10-CM | POA: Diagnosis not present

## 2020-09-10 DIAGNOSIS — H02831 Dermatochalasis of right upper eyelid: Secondary | ICD-10-CM | POA: Diagnosis not present

## 2020-09-10 DIAGNOSIS — H401132 Primary open-angle glaucoma, bilateral, moderate stage: Secondary | ICD-10-CM | POA: Diagnosis not present

## 2020-09-10 DIAGNOSIS — Z961 Presence of intraocular lens: Secondary | ICD-10-CM | POA: Diagnosis not present

## 2020-09-10 DIAGNOSIS — H353132 Nonexudative age-related macular degeneration, bilateral, intermediate dry stage: Secondary | ICD-10-CM | POA: Diagnosis not present

## 2020-09-10 DIAGNOSIS — H02834 Dermatochalasis of left upper eyelid: Secondary | ICD-10-CM | POA: Diagnosis not present

## 2020-09-10 DIAGNOSIS — H04123 Dry eye syndrome of bilateral lacrimal glands: Secondary | ICD-10-CM | POA: Diagnosis not present

## 2020-09-10 DIAGNOSIS — H0102A Squamous blepharitis right eye, upper and lower eyelids: Secondary | ICD-10-CM | POA: Diagnosis not present

## 2020-09-24 DIAGNOSIS — G5601 Carpal tunnel syndrome, right upper limb: Secondary | ICD-10-CM | POA: Diagnosis not present

## 2020-10-01 ENCOUNTER — Other Ambulatory Visit: Payer: Self-pay

## 2020-10-01 ENCOUNTER — Encounter: Payer: Self-pay | Admitting: Nurse Practitioner

## 2020-10-01 ENCOUNTER — Ambulatory Visit (INDEPENDENT_AMBULATORY_CARE_PROVIDER_SITE_OTHER): Payer: Medicare HMO | Admitting: Nurse Practitioner

## 2020-10-01 VITALS — BP 142/72 | HR 66 | Temp 97.8°F | Resp 20 | Ht 60.0 in | Wt 155.0 lb

## 2020-10-01 DIAGNOSIS — M25531 Pain in right wrist: Secondary | ICD-10-CM | POA: Diagnosis not present

## 2020-10-01 DIAGNOSIS — E7800 Pure hypercholesterolemia, unspecified: Secondary | ICD-10-CM

## 2020-10-01 DIAGNOSIS — I10 Essential (primary) hypertension: Secondary | ICD-10-CM

## 2020-10-01 DIAGNOSIS — Z23 Encounter for immunization: Secondary | ICD-10-CM

## 2020-10-01 DIAGNOSIS — Z6829 Body mass index (BMI) 29.0-29.9, adult: Secondary | ICD-10-CM

## 2020-10-01 DIAGNOSIS — E039 Hypothyroidism, unspecified: Secondary | ICD-10-CM

## 2020-10-01 DIAGNOSIS — M8580 Other specified disorders of bone density and structure, unspecified site: Secondary | ICD-10-CM

## 2020-10-01 MED ORDER — LEVOTHYROXINE SODIUM 100 MCG PO TABS: 100.0000 ug | ORAL_TABLET | Freq: Every day | ORAL | 1 refills | Status: DC

## 2020-10-01 MED ORDER — HYDROCHLOROTHIAZIDE 25 MG PO TABS: 25.0000 mg | ORAL_TABLET | Freq: Every day | ORAL | 1 refills | Status: DC

## 2020-10-01 MED ORDER — LOSARTAN POTASSIUM 100 MG PO TABS: 100.0000 mg | ORAL_TABLET | Freq: Every day | ORAL | 1 refills | Status: DC

## 2020-10-01 MED ORDER — PREDNISONE 20 MG PO TABS: 40.0000 mg | ORAL_TABLET | Freq: Every day | ORAL | 0 refills | Status: AC

## 2020-10-01 NOTE — Patient Instructions (Signed)
Flexor Carpi Ulnaris and [employer]tronic Radialis Tendinitis Tendinitis is inflammation in the tissues that connect muscles to bone (tendons). This condition can affect any tendon. Two tendons in the forearm that can be affected by tendinitis are: The flexor carpi ulnaris (FCU). This tendon is located on the pinkie side of the forearm. The flexor carpi radialis (FCR). This tendon is located on the thumb side of the forearm. Tendinitis in either of these tendons will cause wrist or forearm pain. FCU andFCR tendinitis are often caused by overusing the forearm and wrist. What are the causes? Common causes of this condition include: Repeated motions or overuse of the forearm and wrist. Wear and tear from aging. Other causes include: An injury. Too much exercise or strain. Certain antibiotic medicines. In some cases, the cause may not be known. What increases the risk? You are more likely to develop this condition if: You play sports that involve constantly flexing or stretching the wrist and forearm, such as volleyball, golf, or tennis. You are over 85 years of age. You have certain health conditions, such as: Diabetes. Rheumatoid arthritis. Gout. You have a job that involves flexing the wrist over and over, such as working as a Production designer, theatre/television/film, or Scientist, water quality. What are the signs or symptoms? Symptoms of this condition may develop gradually. Symptoms include: Pain or tenderness in the wrist or forearm. Pain when flexing or stretching the wrist or forearm. Pain when gripping or lifting with the palm of the hand. Swelling in the wrist or forearm. How is this diagnosed? This condition may be diagnosed based on: Your symptoms. Your medical history. A physical exam. During the exam, you may be asked to move your hand, wrist, and arm in certain ways. To rule out other conditions, your health care provider may order tests, such as: MRI. This test provides detailed images of the body's soft tissues  and detects tendon tears and inflammation. Ultrasound. This test detects soft tissue injuries, such as tears and inflammation of the ligaments or tendons. How is this treated? This condition may be treated by: Resting the injured area. Applying heat or ice to the wrist or forearm to reduce pain and inflammation. Wearing a splint to keep your wrist and forearm from moving (keep them immobilized) until your symptoms improve. Taking medicine. Your health care provider may prescribe steroids or other anti-inflammatory medicines, like ibuprofen, to ease your pain and other symptoms. Doing exercises to help you maintain movement and range of motion in your wrist (physical therapy). Having injections of an anti-inflammatory medicine (steroid) if the other treatments are not helping. Having surgery. This is only needed in severe cases. Follow these instructions at home: If you have a splint: Wear it as told by your health care provider. Remove it only as told by your health care provider. Loosen it if your fingers tingle, become numb, or turn cold and blue. Keep it clean. If the splint is not waterproof: Do not let it get wet. Cover it with a watertight covering when you take a bath or shower. Managing pain, stiffness, and swelling     If directed, put ice on the painful area. If you have a removable splint, remove it as told by your health care provider. Put ice in a plastic bag. Place a towel between your skin and the bag. Leave the ice on for 20 minutes, 2-3 times a day. If directed, apply heat to the affected area as often as told by your health care provider. Use the heat  source that your health care provider recommends, such as a moist heat pack or a heating pad. Place a towel between your skin and the heat source. Leave the heat on for 20-30 minutes. Remove the heat if your skin turns bright red. This is especially important if you are unable to feel pain, heat, or cold. You may have a  greater risk of getting burned. Move your fingers often to reduce stiffness and swelling. Raise (elevate) the injured area above the level of your heart while you are sitting or lying down. Activity Limit activities that cause your symptoms to get worse or flare up. Do exercises as told by your health care provider. Return to your normal activities as told by your health care provider. Ask your health care provider what activities are safe for you. General instructions Take over-the-counter and prescription medicines only as told by your health care provider. Do not use any products that contain nicotine or tobacco, such as cigarettes, e-cigarettes, and chewing tobacco. These can delay healing. If you need help quitting, ask your health care provider. Keep all follow-up visits as told by your health care provider. This is important. How is this prevented? Warm up and stretch before being active. Cool down and stretch after being active. Give your body time to rest between periods of activity. Make sure to use equipment that fits you. Be safe and responsible while being active. This will help you to avoid falls. Maintain physical fitness, including: Strength. Flexibility. Cardiovascular fitness. Endurance. Contact a health care provider if: Your pain does not improve. Your pain gets worse. Get help right away if: Your pain is severe. You cannot move your wrist. Summary Flexor carpi ulnaris (FCU) tendinitis and flexor carpi radialis (FCR) tendinitis are conditions that involve inflammation in tendons of the forearm. These conditions cause pain or tenderness in the wrist or forearm. Treatment may include resting the painful area, applying heat or ice, taking medicines, and doing exercises to help you maintain movement and range of motion in your wrist (physical therapy). If you have a splint, wear it as told by your health care provider. This information is not intended to replace  advice given to you by your health care provider. Make sure you discuss any questions you have with your healthcare provider. Document Revised: 06/25/2018 Document Reviewed: 05/17/2018 Elsevier Patient Education  [place].

## 2020-10-01 NOTE — Progress Notes (Addendum)
Subjective:    Patient ID: Nicole Wheeler, female    DOB: [birth year], 87 y.o.   MRN: [id number]   Chief Complaint: Medical Management of Chronic Issues    HPI:  1. Primary hypertension No c/o chest pain, sob or headache. Does not check blood pressure at home. BP Readings from Last 3 Encounters:  04/01/20 137/75  09/29/19 (!) 151/71  03/24/19 (!) 158/74     2. Pure hypercholesterolemia Does try to watch diet. Stays very active but does no dedicated exercise. Patient has refused statin in the past. Lab Results  Component Value Date   CHOL 194 04/01/2020   HDL 40 04/01/2020   LDLCALC 133 (H) 04/01/2020   TRIG 118 04/01/2020   CHOLHDL 4.9 (H) 04/01/2020     3. Acquired hypothyroidism No problems that aware of. Lab Results  Component Value Date   TSH 0.975 04/01/2020     4. Osteopenia, unspecified location Last dexascan was done on 05/27/20. T score was -2.3. she is on itamin d daily and also takes centrum silver  5. BMI 29.0-29.9,adult No recent weight changes Wt Readings from Last 3 Encounters:  10/01/20 155 lb (70.3 kg)  07/23/20 155 lb (70.3 kg)  04/01/20 159 lb (72.1 kg)   BMI Readings from Last 3 Encounters:  10/01/20 30.27 kg/m  07/23/20 30.27 kg/m  04/01/20 31.05 kg/m       Outpatient Encounter Medications as of 10/01/2020  Medication Sig   aspirin EC 81 MG tablet Take 81 mg by mouth daily.   brimonidine (ALPHAGAN) 0.15 % ophthalmic solution 1 drop 2 (two) times daily.   calcium carbonate (OSCAL) 1500 (600 Ca) MG TABS tablet Take by mouth 2 (two) times daily with a meal.   Cholecalciferol (VITAMIN D) 2000 units CAPS Take 1,000 Units by mouth daily.   diphenhydramine-acetaminophen (TYLENOL PM) 25-500 MG TABS tablet Take 0.5 tablets by mouth at bedtime as needed.    dorzolamide (TRUSOPT) 2 % ophthalmic solution dorzolamide 2 % eye drops  INSTILL 1 DROP INTO AFFECTED EYE(S) BY OPHTHALMIC ROUTE 3 TIMES PER DAY   fish oil-omega-3 fatty acids 1000 MG  capsule Take 1 capsule by mouth daily.   hydrochlorothiazide (HYDRODIURIL) 25 MG tablet Take 1 tablet (25 mg total) by mouth daily.   latanoprost (XALATAN) 0.005 % ophthalmic solution    levothyroxine (SYNTHROID) 100 MCG tablet Take 1 tablet (100 mcg total) by mouth daily.   losartan (COZAAR) 100 MG tablet Take 1 tablet (100 mg total) by mouth daily.   Multiple Vitamins-Minerals (CENTRUM SILVER PO) Take by mouth.   Multiple Vitamins-Minerals (PRESERVISION/LUTEIN PO) Take by mouth.   triamcinolone cream (KENALOG) 0.1 % Apply 1 application topically 2 (two) times daily as needed.   No facility-administered encounter medications on file as of 10/01/2020.    Past Surgical History:  Procedure Laterality Date   APPENDECTOMY     EYE SURGERY Right    cataract removal   LUMBAR LAMINECTOMY/DECOMPRESSION MICRODISCECTOMY  12/07/2011   Procedure: LUMBAR LAMINECTOMY/DECOMPRESSION MICRODISCECTOMY 1 LEVEL;  Surgeon: Ophelia Charter, MD;  Location: [facility];  Service: Neurosurgery;  Laterality: Right;  Right Lumbar Five-Sacral One Diskectomy   throidectomy     TUBAL LIGATION      Family History  Problem Relation Age of Onset   Stroke Mother 97   Hypertension Mother    Stroke Father 22   Hypertension Father    Heart attack Sister 59       heart attack   Breast cancer  Maternal Aunt    Healthy Daughter    Healthy Son    Healthy Son     New complaints: Right hand pain. She went to see Dr. Gladis Riffle PA last Friday and had a steroid shot in it and was getting some better until she did some ironing yesterday. Now pain is back. Rates pain 10/10 today. Any   Social history: Lives by herself  Controlled substance contract: n/a      Review of Systems  Constitutional:  Negative for diaphoresis.  Eyes:  Negative for pain.  Respiratory:  Negative for shortness of breath.   Cardiovascular:  Negative for chest pain, palpitations and leg swelling.  Gastrointestinal:  Negative for abdominal  pain.  Endocrine: Negative for polydipsia.  Skin:  Negative for rash.  Neurological:  Negative for dizziness, weakness and headaches.  Hematological:  Does not bruise/bleed easily.  All other systems reviewed and are negative.     Objective:   Physical Exam Vitals and nursing note reviewed.  Constitutional:      General: She is not in acute distress.    Appearance: Normal appearance. She is well-developed.  HENT:     Head: Normocephalic.     Right Ear: Tympanic membrane normal.     Left Ear: Tympanic membrane normal.     Nose: Nose normal.     Mouth/Throat:     Mouth: Mucous membranes are moist.  Eyes:     Pupils: Pupils are equal, round, and reactive to light.  Neck:     Vascular: No carotid bruit or JVD.  Cardiovascular:     Rate and Rhythm: Normal rate and regular rhythm.     Heart sounds: Normal heart sounds.  Pulmonary:     Effort: Pulmonary effort is normal. No respiratory distress.     Breath sounds: Normal breath sounds. No wheezing or rales.  Chest:     Chest wall: No tenderness.  Abdominal:     General: Bowel sounds are normal. There is no distension or abdominal bruit.     Palpations: Abdomen is soft. There is no hepatomegaly, splenomegaly, mass or pulsatile mass.     Tenderness: There is no abdominal tenderness.  Musculoskeletal:        General: Normal range of motion.     Cervical back: Normal range of motion and neck supple.     Comments: Right wrist swollen. Painful with any movement Grip weak due to pain  Lymphadenopathy:     Cervical: No cervical adenopathy.  Skin:    General: Skin is warm and dry.  Neurological:     Mental Status: She is alert and oriented to person, place, and time.     Deep Tendon Reflexes: Reflexes are normal and symmetric.  Psychiatric:        Behavior: Behavior normal.        Thought Content: Thought content normal.        Judgment: Judgment normal.    BP (!) 142/72 (BP Location: Left Arm, Cuff Size: Normal)   Pulse 66    Temp 97.8 F (36.6 C) (Temporal)   Resp 20   Ht 5' (1.524 m)   Wt 155 lb (70.3 kg)   SpO2 98%   BMI 30.27 kg/m          Assessment & Plan:  Nicole Wheeler comes in today with chief complaint of Medical Management of Chronic Issues   Diagnosis and orders addressed:  1. Primary hypertension Low sodium diet - hydrochlorothiazide (HYDRODIURIL) 25 MG  tablet; Take 1 tablet (25 mg total) by mouth daily.  Dispense: 90 tablet; Refill: 1 - losartan (COZAAR) 100 MG tablet; Take 1 tablet (100 mg total) by mouth daily.  Dispense: 90 tablet; Refill: 1 - CBC with Differential/Platelet - CMP14+EGFR  2. Pure hypercholesterolemia Low fat diet - Lipid panel  3. Acquired hypothyroidism Labs pending - levothyroxine (SYNTHROID) 100 MCG tablet; Take 1 tablet (100 mcg total) by mouth daily.  Dispense: 90 tablet; Refill: 1 - Thyroid Panel With TSH  4. Osteopenia, unspecified location Weight bearing exercise encouraged Continue daily vitamin d supplement as well as centrum silver  5. BMI 29.0-29.9,adult Discussed diet and exercise for person with BMI >25 Will recheck weight in 3-6 months   6. Right wrist pain Wear wrist brace all day for several days - predniSONE (DELTASONE) 20 MG tablet; Take 2 tablets (40 mg total) by mouth daily with breakfast for 5 days. 2 po daily for 5 days  Dispense: 10 tablet; Refill: 0   Labs pending Health Maintenance reviewed Diet and exercise encouraged  Follow up plan: 6 months   Mary-Margaret Hassell Done, FNP

## 2020-10-01 NOTE — Addendum Note (Signed)
Addended by: Rolena Infante on: 10/01/2020 04:25 PM   Modules accepted: Orders

## 2020-10-02 LAB — LIPID PANEL
Chol/HDL Ratio: 4.1 ratio (ref 0.0–4.4)
Cholesterol, Total: 183 mg/dL (ref 100–199)
HDL: 45 mg/dL (ref >39)
LDL Chol Calc (NIH): 115 mg/dL — ABNORMAL HIGH (ref 0–99)
Triglycerides: 130 mg/dL (ref 0–149)
VLDL Cholesterol Cal: 23 mg/dL (ref 5–40)

## 2020-10-02 LAB — CMP14+EGFR
ALT: 17 IU/L (ref 0–32)
AST: 17 IU/L (ref 0–40)
Albumin/Globulin Ratio: 1.6 (ref 1.2–2.2)
Albumin: 4.1 g/dL (ref 3.6–4.6)
Alkaline Phosphatase: 89 IU/L (ref 44–121)
BUN/Creatinine Ratio: 26 (ref 12–28)
BUN: 32 mg/dL — ABNORMAL HIGH (ref 8–27)
Bilirubin Total: 0.2 mg/dL (ref 0.0–1.2)
CO2: 25 mmol/L (ref 20–29)
Calcium: 9.8 mg/dL (ref 8.7–10.3)
Chloride: 100 mmol/L (ref 96–106)
Creatinine, Ser: 1.23 mg/dL — ABNORMAL HIGH (ref 0.57–1.00)
Globulin, Total: 2.5 g/dL (ref 1.5–4.5)
Glucose: 115 mg/dL — ABNORMAL HIGH (ref 65–99)
Potassium: 4.5 mmol/L (ref 3.5–5.2)
Sodium: 139 mmol/L (ref 134–144)
Total Protein: 6.6 g/dL (ref 6.0–8.5)
eGFR: 43 mL/min/{1.73_m2} — ABNORMAL LOW (ref >59)

## 2020-10-02 LAB — CBC WITH DIFFERENTIAL/PLATELET
Basophils Absolute: 0.1 10*3/uL (ref 0.0–0.2)
Basos: 1 %
EOS (ABSOLUTE): 0.2 10*3/uL (ref 0.0–0.4)
Eos: 1 %
Hematocrit: 36 % (ref 34.0–46.6)
Hemoglobin: 11.8 g/dL (ref 11.1–15.9)
Immature Grans (Abs): 0 10*3/uL (ref 0.0–0.1)
Immature Granulocytes: 0 %
Lymphocytes Absolute: 1.3 10*3/uL (ref 0.7–3.1)
Lymphs: 8 %
MCH: 30.6 pg (ref 26.6–33.0)
MCHC: 32.8 g/dL (ref 31.5–35.7)
MCV: 93 fL (ref 79–97)
Monocytes Absolute: 1.1 10*3/uL — ABNORMAL HIGH (ref 0.1–0.9)
Monocytes: 7 %
Neutrophils Absolute: 12.5 10*3/uL — ABNORMAL HIGH (ref 1.4–7.0)
Neutrophils: 83 %
Platelets: 260 10*3/uL (ref 150–450)
RBC: 3.86 x10E6/uL (ref 3.77–5.28)
RDW: 11.9 % (ref 11.7–15.4)
WBC: 15.3 10*3/uL — ABNORMAL HIGH (ref 3.4–10.8)

## 2020-10-02 LAB — THYROID PANEL WITH TSH
Free Thyroxine Index: 3.2 (ref 1.2–4.9)
T3 Uptake Ratio: 35 % (ref 24–39)
T4, Total: 9.2 ug/dL (ref 4.5–12.0)
TSH: 1.75 u[IU]/mL (ref 0.450–4.500)

## 2020-10-07 ENCOUNTER — Telehealth: Payer: Self-pay | Admitting: Nurse Practitioner

## 2020-10-07 NOTE — Telephone Encounter (Signed)
Patient aware of results.  She feels like she may have a UTI and has an appointment scheduled in the morning.

## 2020-10-08 ENCOUNTER — Other Ambulatory Visit: Payer: Self-pay

## 2020-10-08 ENCOUNTER — Ambulatory Visit (INDEPENDENT_AMBULATORY_CARE_PROVIDER_SITE_OTHER): Payer: Medicare HMO | Admitting: Nurse Practitioner

## 2020-10-08 ENCOUNTER — Encounter: Payer: Self-pay | Admitting: Nurse Practitioner

## 2020-10-08 VITALS — BP 114/69 | HR 70 | Temp 98.0°F | Resp 20 | Ht 60.0 in | Wt 156.0 lb

## 2020-10-08 DIAGNOSIS — R300 Dysuria: Secondary | ICD-10-CM

## 2020-10-08 DIAGNOSIS — N3000 Acute cystitis without hematuria: Secondary | ICD-10-CM | POA: Diagnosis not present

## 2020-10-08 LAB — URINALYSIS, COMPLETE
Bilirubin, UA: NEGATIVE
Glucose, UA: NEGATIVE
Nitrite, UA: NEGATIVE
Specific Gravity, UA: 1.03 (ref 1.005–1.030)
Urobilinogen, Ur: 0.2 mg/dL (ref 0.2–1.0)
pH, UA: 5.5 (ref 5.0–7.5)

## 2020-10-08 LAB — MICROSCOPIC EXAMINATION
RBC, Urine: >30 /hpf — AB (ref 0–2)
WBC, UA: >30 /hpf — AB (ref 0–5)

## 2020-10-08 MED ORDER — SULFAMETHOXAZOLE-TRIMETHOPRIM 800-160 MG PO TABS
1.0000 | ORAL_TABLET | Freq: Two times a day (BID) | ORAL | 0 refills | Status: DC
Start: 2020-10-08 — End: 2021-04-05

## 2020-10-08 NOTE — Progress Notes (Signed)
   Subjective:    Patient ID: AMEI PIROZZI, female    DOB: 17-Aug-1930, 87 y.o.   MRN: [id number]   Chief Complaint: Urinary Frequency (Feels like she is shaking inside at times and just doesn't feel right)   HPI Patient comes in  today c/o urinary frequency. She got up 6 times Wednesday night. She says her "bottom feels like it is falling out". She went to bathroom to void evry 76mn yesterday to void. She had slight dizziness.     Review of Systems  Genitourinary:  Positive for dysuria, frequency and urgency. Negative for hematuria, vaginal bleeding, vaginal discharge and vaginal pain.  All other systems reviewed and are negative.     Objective:   Physical Exam Vitals reviewed.  Constitutional:      Appearance: Normal appearance.  Cardiovascular:     Rate and Rhythm: Normal rate and regular rhythm.     Heart sounds: Normal heart sounds.  Pulmonary:     Effort: Pulmonary effort is normal.     Breath sounds: Normal breath sounds.  Skin:    General: Skin is warm.  Neurological:     General: No focal deficit present.     Mental Status: She is alert and oriented to person, place, and time.  Psychiatric:        Mood and Affect: Mood normal.        Behavior: Behavior normal.    BP 114/69   Pulse 70   Temp 98 F (36.7 C) (Temporal)   Resp 20   Ht 5' (1.524 m)   Wt 156 lb (70.8 kg)   SpO2 96%   BMI 30.47 kg/m        Assessment & Plan:   PConstance Goltzin today with chief complaint of Urinary Frequency (Feels like she is shaking inside at times and just doesn't feel right)   1. Dysuria - Urinalysis, Complete  2. Acute cystitis without hematuria Take medication as prescribe Cotton underwear Take shower not bath Cranberry juice, yogurt Force fluids AZO over the counter X2 days Culture pending RTO prn  - sulfamethoxazole-trimethoprim (BACTRIM DS) 800-160 MG tablet; Take 1 tablet by mouth 2 (two) times daily.  Dispense: 20 tablet; Refill: 0 - Urine  Culture    The above assessment and management plan was discussed with the patient. The patient verbalized understanding of and has agreed to the management plan. Patient is aware to call the clinic if symptoms persist or worsen. Patient is aware when to return to the clinic for a follow-up visit. Patient educated on when it is appropriate to go to the emergency department.   Mary-Margaret MHassell Done FNP

## 2020-10-08 NOTE — Patient Instructions (Signed)

## 2020-10-11 LAB — URINE CULTURE

## 2020-10-13 ENCOUNTER — Other Ambulatory Visit: Payer: Self-pay

## 2020-10-13 ENCOUNTER — Ambulatory Visit
Admission: RE | Admit: 2020-10-13 | Discharge: 2020-10-13 | Disposition: A | Discharge status: Home / Self Care | Payer: Medicare HMO | Source: Ambulatory Visit | Attending: Nurse Practitioner | Admitting: Nurse Practitioner

## 2020-10-13 DIAGNOSIS — Z1231 Encounter for screening mammogram for malignant neoplasm of breast: Secondary | ICD-10-CM

## 2020-12-10 DIAGNOSIS — Z23 Encounter for immunization: Secondary | ICD-10-CM | POA: Diagnosis not present

## 2021-01-11 DIAGNOSIS — Z961 Presence of intraocular lens: Secondary | ICD-10-CM | POA: Diagnosis not present

## 2021-01-11 DIAGNOSIS — H353132 Nonexudative age-related macular degeneration, bilateral, intermediate dry stage: Secondary | ICD-10-CM | POA: Diagnosis not present

## 2021-01-11 DIAGNOSIS — H401132 Primary open-angle glaucoma, bilateral, moderate stage: Secondary | ICD-10-CM | POA: Diagnosis not present

## 2021-01-11 DIAGNOSIS — H0102A Squamous blepharitis right eye, upper and lower eyelids: Secondary | ICD-10-CM | POA: Diagnosis not present

## 2021-01-11 DIAGNOSIS — H0102B Squamous blepharitis left eye, upper and lower eyelids: Secondary | ICD-10-CM | POA: Diagnosis not present

## 2021-01-11 DIAGNOSIS — H02834 Dermatochalasis of left upper eyelid: Secondary | ICD-10-CM | POA: Diagnosis not present

## 2021-01-11 DIAGNOSIS — H04123 Dry eye syndrome of bilateral lacrimal glands: Secondary | ICD-10-CM | POA: Diagnosis not present

## 2021-01-11 DIAGNOSIS — H02831 Dermatochalasis of right upper eyelid: Secondary | ICD-10-CM | POA: Diagnosis not present

## 2021-04-05 ENCOUNTER — Ambulatory Visit (INDEPENDENT_AMBULATORY_CARE_PROVIDER_SITE_OTHER): Payer: Medicare HMO | Admitting: Nurse Practitioner

## 2021-04-05 ENCOUNTER — Encounter: Payer: Self-pay | Admitting: Nurse Practitioner

## 2021-04-05 VITALS — BP 138/82 | HR 63 | Temp 98.2°F | Resp 20 | Ht 60.0 in | Wt 159.0 lb

## 2021-04-05 DIAGNOSIS — K219 Gastro-esophageal reflux disease without esophagitis: Secondary | ICD-10-CM

## 2021-04-05 DIAGNOSIS — E039 Hypothyroidism, unspecified: Secondary | ICD-10-CM | POA: Diagnosis not present

## 2021-04-05 DIAGNOSIS — Z6829 Body mass index (BMI) 29.0-29.9, adult: Secondary | ICD-10-CM

## 2021-04-05 DIAGNOSIS — E7800 Pure hypercholesterolemia, unspecified: Secondary | ICD-10-CM

## 2021-04-05 DIAGNOSIS — I10 Essential (primary) hypertension: Secondary | ICD-10-CM

## 2021-04-05 DIAGNOSIS — M8588 Other specified disorders of bone density and structure, other site: Secondary | ICD-10-CM | POA: Diagnosis not present

## 2021-04-05 DIAGNOSIS — R079 Chest pain, unspecified: Secondary | ICD-10-CM | POA: Diagnosis not present

## 2021-04-05 DIAGNOSIS — Z23 Encounter for immunization: Secondary | ICD-10-CM

## 2021-04-05 DIAGNOSIS — R8290 Unspecified abnormal findings in urine: Secondary | ICD-10-CM

## 2021-04-05 LAB — URINALYSIS, COMPLETE
Bilirubin, UA: NEGATIVE
Glucose, UA: NEGATIVE
Nitrite, UA: NEGATIVE
Protein,UA: NEGATIVE
RBC, UA: NEGATIVE
Specific Gravity, UA: 1.015 (ref 1.005–1.030)
Urobilinogen, Ur: 0.2 mg/dL (ref 0.2–1.0)
pH, UA: 6 (ref 5.0–7.5)

## 2021-04-05 LAB — MICROSCOPIC EXAMINATION: RBC, Urine: NONE SEEN /hpf (ref 0–2)

## 2021-04-05 MED ORDER — OMEPRAZOLE 20 MG PO CPDR: 20.0000 mg | DELAYED_RELEASE_CAPSULE | Freq: Every day | ORAL | 3 refills | Status: DC

## 2021-04-05 MED ORDER — LOSARTAN POTASSIUM 100 MG PO TABS: 100.0000 mg | ORAL_TABLET | Freq: Every day | ORAL | 1 refills | Status: DC

## 2021-04-05 MED ORDER — HYDROCHLOROTHIAZIDE 25 MG PO TABS: 25.0000 mg | ORAL_TABLET | Freq: Every day | ORAL | 1 refills | Status: DC

## 2021-04-05 MED ORDER — LEVOTHYROXINE SODIUM 100 MCG PO TABS: 100.0000 ug | ORAL_TABLET | Freq: Every day | ORAL | 1 refills | Status: DC

## 2021-04-05 NOTE — Progress Notes (Addendum)
Subjective:    Patient ID: Nicole Wheeler, female    DOB: Mar 26, 1930, 88 y.o.   MRN: [id number]  Chief Complaint: medical management of chronic issues     HPI:  Nicole Wheeler is a 88 y.o. who identifies as a female who was assigned female at birth.   Social history: Lives with: by herself- family and friends check on her daily Work history: retired   Scientist, forensic in today for follow up of the following chronic medical issues:  1. Primary hypertension No c/o chest pain,sob or headache. Does not check blood pressure at home. BP Readings from Last 3 Encounters:  10/08/20 114/69  10/01/20 (!) 142/72  04/01/20 137/75     2. Pure hypercholesterolemia Does not watch diet and doe nso dedicated exercise. Lab Results  Component Value Date   CHOL 183 10/01/2020   HDL 45 10/01/2020   LDLCALC 115 (H) 10/01/2020   TRIG 130 10/01/2020   CHOLHDL 4.1 10/01/2020     3. Acquired hypothyroidism No problems that she is aware of. Lab Results  Component Value Date   TSH 1.750 10/01/2020     4. Osteopenia of lumbar spine Last dexascan was done 05/28/20. Her t score was -2.3. she is on a calcium and vitamin d supplement daily.  5. BMI 29.0-29.9,adult No recent weight changes. Wt Readings from Last 3 Encounters:  04/05/21 159 lb (72.1 kg)  10/08/20 156 lb (70.8 kg)  10/01/20 155 lb (70.3 kg)   BMI Readings from Last 3 Encounters:  04/05/21 31.05 kg/m  10/08/20 30.47 kg/m  10/01/20 30.27 kg/m      New complaints: -Urine has a foul odor and she would like it checked. She denies any dysuria, freq or urgency. - chest pain- feels like elephant is sitting on her chest at night. Will wake her up. Happens 2-3 x a week. Last about 10-20 minutes at times- TUMS seems to help Allergies  Allergen Reactions   Ace Inhibitors Cough   Crestor [Rosuvastatin Calcium]     Myalgia    Lipitor [Atorvastatin]     Myalgia    Outpatient Encounter Medications as of 04/05/2021  Medication Sig    aspirin EC 81 MG tablet Take 81 mg by mouth daily.   brimonidine (ALPHAGAN) 0.15 % ophthalmic solution 1 drop 2 (two) times daily.   calcium carbonate (OSCAL) 1500 (600 Ca) MG TABS tablet Take by mouth 2 (two) times daily with a meal.   Cholecalciferol (VITAMIN D) 2000 units CAPS Take 1,000 Units by mouth daily.   diphenhydramine-acetaminophen (TYLENOL PM) 25-500 MG TABS tablet Take 0.5 tablets by mouth at bedtime as needed.    dorzolamide (TRUSOPT) 2 % ophthalmic solution dorzolamide 2 % eye drops  INSTILL 1 DROP INTO AFFECTED EYE(S) BY OPHTHALMIC ROUTE 3 TIMES PER DAY   fish oil-omega-3 fatty acids 1000 MG capsule Take 1 capsule by mouth daily.   hydrochlorothiazide (HYDRODIURIL) 25 MG tablet Take 1 tablet (25 mg total) by mouth daily.   latanoprost (XALATAN) 0.005 % ophthalmic solution    levothyroxine (SYNTHROID) 100 MCG tablet Take 1 tablet (100 mcg total) by mouth daily.   losartan (COZAAR) 100 MG tablet Take 1 tablet (100 mg total) by mouth daily.   Multiple Vitamins-Minerals (CENTRUM SILVER PO) Take by mouth.   Multiple Vitamins-Minerals (PRESERVISION/LUTEIN PO) Take by mouth.   sulfamethoxazole-trimethoprim (BACTRIM DS) 800-160 MG tablet Take 1 tablet by mouth 2 (two) times daily.   triamcinolone cream (KENALOG) 0.1 % Apply 1 application topically 2 (  two) times daily as needed.   No facility-administered encounter medications on file as of 04/05/2021.    Past Surgical History:  Procedure Laterality Date   APPENDECTOMY     EYE SURGERY Right    cataract removal   LUMBAR LAMINECTOMY/DECOMPRESSION MICRODISCECTOMY  12/07/2011   Procedure: LUMBAR LAMINECTOMY/DECOMPRESSION MICRODISCECTOMY 1 LEVEL;  Surgeon: Ophelia Charter, MD;  Location: [facility];  Service: Neurosurgery;  Laterality: Right;  Right Lumbar Five-Sacral One Diskectomy   throidectomy     TUBAL LIGATION      Family History  Problem Relation Age of Onset   Stroke Mother 98   Hypertension Mother    Stroke Father  65   Hypertension Father    Heart attack Sister 69       heart attack   Breast cancer Maternal Aunt    Healthy Daughter    Healthy Son    Healthy Son       Controlled substance contract: n/a     Review of Systems  Constitutional:  Negative for diaphoresis.  Eyes:  Negative for pain.  Respiratory:  Negative for shortness of breath.   Cardiovascular:  Negative for chest pain, palpitations and leg swelling.  Gastrointestinal:  Negative for abdominal pain.  Endocrine: Negative for polydipsia.  Skin:  Negative for rash.  Neurological:  Negative for dizziness, weakness and headaches.  Hematological:  Does not bruise/bleed easily.  All other systems reviewed and are negative.     Objective:   Physical Exam Vitals and nursing note reviewed.  Constitutional:      General: She is not in acute distress.    Appearance: Normal appearance. She is well-developed.  HENT:     Head: Normocephalic.     Right Ear: Tympanic membrane normal.     Left Ear: Tympanic membrane normal.     Nose: Nose normal.     Mouth/Throat:     Mouth: Mucous membranes are moist.  Eyes:     Pupils: Pupils are equal, round, and reactive to light.  Neck:     Vascular: No carotid bruit or JVD.  Cardiovascular:     Rate and Rhythm: Normal rate and regular rhythm.     Heart sounds: Normal heart sounds.  Pulmonary:     Effort: Pulmonary effort is normal. No respiratory distress.     Breath sounds: Normal breath sounds. No wheezing or rales.  Chest:     Chest wall: No tenderness.  Abdominal:     General: Bowel sounds are normal. There is no distension or abdominal bruit.     Palpations: Abdomen is soft. There is no hepatomegaly, splenomegaly, mass or pulsatile mass.     Tenderness: There is no abdominal tenderness.  Musculoskeletal:        General: Normal range of motion.     Cervical back: Normal range of motion and neck supple.  Lymphadenopathy:     Cervical: No cervical adenopathy.  Skin:     General: Skin is warm and dry.  Neurological:     Mental Status: She is alert and oriented to person, place, and time.     Deep Tendon Reflexes: Reflexes are normal and symmetric.  Psychiatric:        Behavior: Behavior normal.        Thought Content: Thought content normal.        Judgment: Judgment normal.    BP 138/82    Pulse 63    Temp 98.2 F (36.8 C) (Temporal)    Resp  20    Ht 5' (1.524 m)    Wt 159 lb (72.1 kg)    SpO2 98%    BMI 31.05 kg/m   Nicole Nissen, FNP  Urine clear- ordered culture     Assessment & Plan:   Nicole Wheeler comes in today with chief complaint of Medical Management of Chronic Issues (Wants ears checked)   Diagnosis and orders addressed:  1. Primary hypertension Low sodium diet - hydrochlorothiazide (HYDRODIURIL) 25 MG tablet; Take 1 tablet (25 mg total) by mouth daily.  Dispense: 90 tablet; Refill: 1 - losartan (COZAAR) 100 MG tablet; Take 1 tablet (100 mg total) by mouth daily.  Dispense: 90 tablet; Refill: 1  2. Pure hypercholesterolemia Low fat diet  3. Acquired hypothyroidism Labs pending - levothyroxine (SYNTHROID) 100 MCG tablet; Take 1 tablet (100 mcg total) by mouth daily.  Dispense: 90 tablet; Refill: 1  4. Osteopenia of lumbar spine Weight bearing exercises  5. BMI 29.0-29.9,adult Discussed diet and exercise for person with BMI >25 Will recheck weight in 3-6 months   6. Foul smelling urine Urine clear- culture ordered - Urinalysis, Complete - Urine Culture  7. Chest pain, unspecified type Referral to cardiology - EKG 12-Lead - Ambulatory referral to Cardiology  8. Gastroesophageal reflux disease without esophagitis Avoid spicy foods Do not eat 2 hours prior to bedtime  - omeprazole (PRILOSEC) 20 MG capsule; Take 1 capsule (20 mg total) by mouth daily.  Dispense: 30 capsule; Refill: 3  Orders Placed This Encounter  Procedures   Urine Culture   Urinalysis, Complete   CBC with Differential/Platelet    CMP14+EGFR   Lipid panel   Thyroid Panel With TSH   Ambulatory referral to Cardiology    Referral Priority:   Routine    Referral Type:   Consultation    Referral Reason:   Specialty Services Required    Requested Specialty:   Cardiology    Number of Visits Requested:   1   EKG 12-Lead     Labs pending Health Maintenance reviewed Diet and exercise encouraged  Follow up plan: 6 months   [place], FNP

## 2021-04-05 NOTE — Addendum Note (Signed)
Addended by: Rolena Infante on: 04/05/2021 04:41 PM   Modules accepted: Orders

## 2021-04-05 NOTE — Addendum Note (Signed)
Addended by: Chevis Pretty on: 04/05/2021 11:44 AM   Modules accepted: Orders

## 2021-04-05 NOTE — Patient Instructions (Signed)

## 2021-04-06 LAB — LIPID PANEL
Chol/HDL Ratio: 4.2 ratio (ref 0.0–4.4)
Cholesterol, Total: 191 mg/dL (ref 100–199)
HDL: 45 mg/dL (ref >39)
LDL Chol Calc (NIH): 122 mg/dL — ABNORMAL HIGH (ref 0–99)
Triglycerides: 134 mg/dL (ref 0–149)
VLDL Cholesterol Cal: 24 mg/dL (ref 5–40)

## 2021-04-06 LAB — CBC WITH DIFFERENTIAL/PLATELET
Basophils Absolute: 0.1 10*3/uL (ref 0.0–0.2)
Basos: 1 %
EOS (ABSOLUTE): 0.2 10*3/uL (ref 0.0–0.4)
Eos: 3 %
Hematocrit: 35 % (ref 34.0–46.6)
Hemoglobin: 11.4 g/dL (ref 11.1–15.9)
Immature Grans (Abs): 0 10*3/uL (ref 0.0–0.1)
Immature Granulocytes: 0 %
Lymphocytes Absolute: 1.4 10*3/uL (ref 0.7–3.1)
Lymphs: 19 %
MCH: 30.3 pg (ref 26.6–33.0)
MCHC: 32.6 g/dL (ref 31.5–35.7)
MCV: 93 fL (ref 79–97)
Monocytes Absolute: 0.6 10*3/uL (ref 0.1–0.9)
Monocytes: 9 %
Neutrophils Absolute: 5.1 10*3/uL (ref 1.4–7.0)
Neutrophils: 68 %
Platelets: 260 10*3/uL (ref 150–450)
RBC: 3.76 x10E6/uL — ABNORMAL LOW (ref 3.77–5.28)
RDW: 12.3 % (ref 11.7–15.4)
WBC: 7.4 10*3/uL (ref 3.4–10.8)

## 2021-04-06 LAB — CMP14+EGFR
ALT: 14 IU/L (ref 0–32)
AST: 20 IU/L (ref 0–40)
Albumin/Globulin Ratio: 1.6 (ref 1.2–2.2)
Albumin: 4 g/dL (ref 3.6–4.6)
Alkaline Phosphatase: 91 IU/L (ref 44–121)
BUN/Creatinine Ratio: 27 (ref 12–28)
BUN: 35 mg/dL — ABNORMAL HIGH (ref 8–27)
Bilirubin Total: <0.2 mg/dL (ref 0.0–1.2)
CO2: 25 mmol/L (ref 20–29)
Calcium: 9.6 mg/dL (ref 8.7–10.3)
Chloride: 102 mmol/L (ref 96–106)
Creatinine, Ser: 1.3 mg/dL — ABNORMAL HIGH (ref 0.57–1.00)
Globulin, Total: 2.5 g/dL (ref 1.5–4.5)
Glucose: 108 mg/dL — ABNORMAL HIGH (ref 70–99)
Potassium: 4.5 mmol/L (ref 3.5–5.2)
Sodium: 140 mmol/L (ref 134–144)
Total Protein: 6.5 g/dL (ref 6.0–8.5)
eGFR: 40 mL/min/{1.73_m2} — ABNORMAL LOW (ref >59)

## 2021-04-06 LAB — THYROID PANEL WITH TSH
Free Thyroxine Index: 2.6 (ref 1.2–4.9)
T3 Uptake Ratio: 32 % (ref 24–39)
T4, Total: 8 ug/dL (ref 4.5–12.0)
TSH: 1.55 u[IU]/mL (ref 0.450–4.500)

## 2021-04-07 LAB — URINE CULTURE

## 2021-04-11 NOTE — Progress Notes (Signed)
Referring-Mary-Margaret Hassell Done, FNP Reason for referral-chest pain  HPI: 88 year old female for evaluation of chest pain at request of [place], FNP.  Patient states that recently she has had "indigestion" or a heavy feeling on her chest after eating certain foods.  There is occasional associated waterbrash.  Her symptoms are relieved with Tums.  Her chest pain does not radiate nor are there associated symptoms.  Typically lasts 20 minutes and resolve with Tums.  She was seen by primary care and placed on Prilosec and her symptoms have resolved.  She denies exertional chest pain.  She has some dyspnea on exertion but no orthopnea, PND, pedal edema or syncope.  Cardiology now asked to evaluate.  Current Outpatient Medications  Medication Sig Dispense Refill   aspirin EC 81 MG tablet Take 81 mg by mouth daily.     brimonidine (ALPHAGAN) 0.15 % ophthalmic solution 1 drop 2 (two) times daily.     Calcium Carbonate-Vit D-Min (CALCIUM 1200 PO) Take by mouth.     Cholecalciferol (VITAMIN D) 2000 units CAPS Take 1,000 Units by mouth daily.     diphenhydramine-acetaminophen (TYLENOL PM) 25-500 MG TABS tablet Take 0.5 tablets by mouth at bedtime as needed.      dorzolamide (TRUSOPT) 2 % ophthalmic solution dorzolamide 2 % eye drops  INSTILL 1 DROP INTO AFFECTED EYE(S) BY OPHTHALMIC ROUTE 3 TIMES PER DAY     fish oil-omega-3 fatty acids 1000 MG capsule Take 1 capsule by mouth daily.     hydrochlorothiazide (HYDRODIURIL) 25 MG tablet Take 1 tablet (25 mg total) by mouth daily. 90 tablet 1   latanoprost (XALATAN) 0.005 % ophthalmic solution      levothyroxine (SYNTHROID) 100 MCG tablet Take 1 tablet (100 mcg total) by mouth daily. 90 tablet 1   losartan (COZAAR) 100 MG tablet Take 1 tablet (100 mg total) by mouth daily. 90 tablet 1   Multiple Vitamins-Minerals (CENTRUM SILVER PO) Take by mouth.     Multiple Vitamins-Minerals (PRESERVISION/LUTEIN PO) Take by mouth 2 (two) times daily.      omeprazole (PRILOSEC) 20 MG capsule Take 1 capsule (20 mg total) by mouth daily. 30 capsule 3   triamcinolone cream (KENALOG) 0.1 % Apply 1 application topically 2 (two) times daily as needed. (Patient not taking: Reported on 04/15/2021) 600 each 3   No current facility-administered medications for this visit.    Allergies  Allergen Reactions   Ace Inhibitors Cough   Crestor [Rosuvastatin Calcium]     Myalgia    Lipitor [Atorvastatin]     Myalgia      Past Medical History:  Diagnosis Date   Arthritis    Cataract    Glaucoma    Hyperlipidemia    Hypertension    Hypothyroidism    dr don Laurance Flatten   pcp   Macular degeneration    Osteopenia    SCC (squamous cell carcinoma) 09/30/2013   right post leg scc insitu cx3 15fu   Shingles    Spinal stenosis    Squamous cell carcinoma of skin 09/30/2013   right hand scc well diff cx3 81fu    Past Surgical History:  Procedure Laterality Date   APPENDECTOMY     EYE SURGERY Right    cataract removal   LUMBAR LAMINECTOMY/DECOMPRESSION MICRODISCECTOMY  12/07/2011   Procedure: LUMBAR LAMINECTOMY/DECOMPRESSION MICRODISCECTOMY 1 LEVEL;  Surgeon: Ophelia Charter, MD;  Location: [facility];  Service: Neurosurgery;  Laterality: Right;  Right Lumbar Five-Sacral One Diskectomy   throidectomy  TUBAL LIGATION      Social History   Socioeconomic History   Marital status: Widowed    Spouse name: Not on file   Number of children: 3   Years of education: 5   Highest education level: Some college, no degree  Occupational History   Occupation: Retired    Comment: Veterinary surgeon work  Tobacco Use   Smoking status: Former    Packs/day: 1.00    Years: 25.00    Pack years: 25.00    Types: Cigarettes    Quit date: 03/13/1978    Years since quitting: 43.1   Smokeless tobacco: Never  Vaping Use   Vaping Use: Never used  Substance and Sexual Activity   Alcohol use: Yes    Comment: occasional glass of wine   Drug use: No   Sexual activity:  Never  Other Topics Concern   Not on file  Social History Narrative   Joniqua is retired and lives alone. She is widowed and has 3 grown children. She enjoys sewing, quilting, and word puzzles. She is involved in her church.    Social Determinants of Health   Financial Resource Strain: Low Risk    Difficulty of Paying Living Expenses: Not hard at all  Food Insecurity: No Food Insecurity   Worried About Charity fundraiser in the Last Year: Never true   [place] in the Last Year: Never true  Transportation Needs: No Transportation Needs   Lack of Transportation (Medical): No   Lack of Transportation (Non-Medical): No  Physical Activity: Inactive   Days of Exercise per Week: 0 days   Minutes of Exercise per Session: 0 min  Stress: No Stress Concern Present   Feeling of Stress : Not at all  Social Connections: Moderately Integrated   Frequency of Communication with Friends and Family: More than three times a week   Frequency of Social Gatherings with Friends and Family: More than three times a week   Attends Religious Services: More than 4 times per year   Active Member of [employer] or Organizations: Yes   Attends Archivist Meetings: More than 4 times per year   Marital Status: Widowed  Human resources officer Violence: Not At Risk   Fear of Current or Ex-Partner: No   Emotionally Abused: No   Physically Abused: No   Sexually Abused: No    Family History  Problem Relation Age of Onset   Stroke Mother 70   Hypertension Mother    Stroke Father 70   Hypertension Father    Heart attack Sister 89       heart attack   Breast cancer Maternal Aunt    Healthy Daughter    Healthy Son    Healthy Son     ROS: Arthralgias but no fevers or chills, productive cough, hemoptysis, dysphasia, odynophagia, melena, hematochezia, dysuria, hematuria, rash, seizure activity, orthopnea, PND, pedal edema, claudication. Remaining systems are negative.  Physical Exam:   Blood pressure (!)  154/98, pulse 79, height 5' (1.524 m), weight 159 lb 12.8 oz (72.5 kg), SpO2 99 %.  General:  Well developed/well nourished in NAD Skin warm/dry Patient not depressed No peripheral clubbing Back-normal HEENT-normal/normal eyelids Neck supple/normal carotid upstroke bilaterally; no bruits; no JVD; no thyromegaly chest - CTA/ normal expansion CV - RRR/normal S1 and S2; no rubs or gallops;  PMI nondisplaced; 1/6 systolic ejection murmur Abdomen -NT/ND, no HSM, no mass, + bowel sounds, no bruit 2+ femoral pulses, no bruits Ext-no edema,  chords, 2+ DP Neuro-grossly nonfocal  ECG -April 05, 2021-normal sinus rhythm, first-degree AV block.  Personally reviewed  A/P  1 chest pain-symptoms seem most consistent with reflux.  They occur after eating certain foods, there is associated waterbrash and they improved with Tums.  They have resolved with Prilosec.  Her electrocardiogram is normal.  We discussed further cardiac work-up today including a functional study but we have elected to follow since her symptoms have resolved.  She will contact us if she develops worsening symptoms in the future and we can consider further cardiac work-up at that time.  2 hypertension-BP elevated; however she follows this closely at home and it is typically controlled.  Continue present medications and follow.  3 hyperlipidemia-she did not tolerate statins previously and is not interested in other lipid-lowering medications.  Kirk Ruths, MD

## 2021-04-15 ENCOUNTER — Ambulatory Visit: Payer: Medicare HMO | Admitting: Cardiology

## 2021-04-15 ENCOUNTER — Encounter: Payer: Self-pay | Admitting: Cardiology

## 2021-04-15 ENCOUNTER — Other Ambulatory Visit: Payer: Self-pay

## 2021-04-15 VITALS — BP 154/98 | HR 79 | Ht 60.0 in | Wt 159.8 lb

## 2021-04-15 DIAGNOSIS — E7800 Pure hypercholesterolemia, unspecified: Secondary | ICD-10-CM

## 2021-04-15 DIAGNOSIS — R072 Precordial pain: Secondary | ICD-10-CM | POA: Diagnosis not present

## 2021-04-15 DIAGNOSIS — I10 Essential (primary) hypertension: Secondary | ICD-10-CM

## 2021-04-15 NOTE — Patient Instructions (Signed)

## 2021-05-16 DIAGNOSIS — H02834 Dermatochalasis of left upper eyelid: Secondary | ICD-10-CM | POA: Diagnosis not present

## 2021-05-16 DIAGNOSIS — H401132 Primary open-angle glaucoma, bilateral, moderate stage: Secondary | ICD-10-CM | POA: Diagnosis not present

## 2021-05-16 DIAGNOSIS — Z961 Presence of intraocular lens: Secondary | ICD-10-CM | POA: Diagnosis not present

## 2021-05-16 DIAGNOSIS — H0102A Squamous blepharitis right eye, upper and lower eyelids: Secondary | ICD-10-CM | POA: Diagnosis not present

## 2021-05-16 DIAGNOSIS — H353132 Nonexudative age-related macular degeneration, bilateral, intermediate dry stage: Secondary | ICD-10-CM | POA: Diagnosis not present

## 2021-05-16 DIAGNOSIS — H0102B Squamous blepharitis left eye, upper and lower eyelids: Secondary | ICD-10-CM | POA: Diagnosis not present

## 2021-05-16 DIAGNOSIS — H04123 Dry eye syndrome of bilateral lacrimal glands: Secondary | ICD-10-CM | POA: Diagnosis not present

## 2021-05-16 DIAGNOSIS — H02831 Dermatochalasis of right upper eyelid: Secondary | ICD-10-CM | POA: Diagnosis not present

## 2021-06-14 DIAGNOSIS — G5601 Carpal tunnel syndrome, right upper limb: Secondary | ICD-10-CM | POA: Diagnosis not present

## 2021-06-14 DIAGNOSIS — G5602 Carpal tunnel syndrome, left upper limb: Secondary | ICD-10-CM | POA: Diagnosis not present

## 2021-06-14 DIAGNOSIS — G5603 Carpal tunnel syndrome, bilateral upper limbs: Secondary | ICD-10-CM | POA: Diagnosis not present

## 2021-07-05 ENCOUNTER — Ambulatory Visit (INDEPENDENT_AMBULATORY_CARE_PROVIDER_SITE_OTHER): Payer: Medicare HMO | Admitting: Nurse Practitioner

## 2021-07-05 ENCOUNTER — Encounter: Payer: Self-pay | Admitting: Nurse Practitioner

## 2021-07-05 VITALS — BP 109/61 | HR 93 | Temp 98.0°F | Resp 20 | Ht 60.0 in | Wt 155.0 lb

## 2021-07-05 DIAGNOSIS — J0100 Acute maxillary sinusitis, unspecified: Secondary | ICD-10-CM

## 2021-07-05 MED ORDER — AZITHROMYCIN 250 MG PO TABS: ORAL_TABLET | ORAL | 0 refills | Status: DC

## 2021-07-05 NOTE — Progress Notes (Signed)
? ?Subjective:  ? ? Patient ID: Nicole Wheeler, female    DOB: Jan 26, 1931, 88 y.o.   MRN: [id number] ? ?Chief Complaint: URI ? ? ?Patient comes in today c/o congestion. ? ?URI  ?This is a new problem. Episode onset: 12 days ago. The problem has been waxing and waning. There has been no fever. Associated symptoms include congestion, coughing, headaches, rhinorrhea and sinus pain. Pertinent negatives include no sore throat. She has tried antihistamine for the symptoms. The treatment provided mild relief.  ? ?Covid test negative ? ? ?Review of Systems  ?Constitutional:  Positive for fatigue. Negative for chills and fever.  ?HENT:  Positive for congestion, rhinorrhea and sinus pain. Negative for sore throat.   ?Respiratory:  Positive for cough.   ?Musculoskeletal:  Positive for myalgias.  ?Neurological:  Positive for headaches.  ? ?   ?Objective:  ? Physical Exam ?Vitals reviewed.  ?Constitutional:   ?   Appearance: Normal appearance.  ?HENT:  ?   Left Ear: Tympanic membrane normal.  ?   Nose: Congestion and rhinorrhea present.  ?   Mouth/Throat:  ?   Mouth: Mucous membranes are moist.  ?   Pharynx: Oropharynx is clear. No oropharyngeal exudate or posterior oropharyngeal erythema.  ?Eyes:  ?   Pupils: Pupils are equal, round, and reactive to light.  ?Cardiovascular:  ?   Rate and Rhythm: Normal rate and regular rhythm.  ?   Heart sounds: Normal heart sounds.  ?Pulmonary:  ?   Effort: Pulmonary effort is normal.  ?   Breath sounds: Normal breath sounds.  ?Musculoskeletal:  ?   Cervical back: Normal range of motion and neck supple.  ?Skin: ?   General: Skin is warm.  ?Neurological:  ?   General: No focal deficit present.  ?   Mental Status: She is alert and oriented to person, place, and time.  ?   Coordination: Coordination abnormal.  ?Psychiatric:     ?   Mood and Affect: Mood normal.     ?   Behavior: Behavior normal.  ? ?BP 109/61   Pulse 93   Temp 98 ?F (36.7 ?C) (Temporal)   Resp 20   Ht 5' (1.524 m)   Wt 155 lb  (70.3 kg)   SpO2 98%   BMI 30.27 kg/m?  ? ? ? ? ?   ?Assessment & Plan:  ? ?[name] in today with chief complaint of URI (Cough, nasal congestion) ? ? ?1. Acute non-recurrent maxillary sinusitis ?1. Take meds as prescribed ?2. Use a cool mist humidifier especially during the winter months and when heat has been humid. ?3. Use saline nose sprays frequently ?4. Saline irrigations of the nose can be very helpful if done frequently. ? * 4X daily for 1 week* ? * Use of a nettie pot can be helpful with this. Follow directions with this* ?5. Drink plenty of fluids ?6. Keep thermostat turn down low ?7.For any cough or congestion- delsym ?8. For fever or aces or pains- take tylenol or ibuprofen appropriate for age and weight. ? * for fevers greater than 101 orally you may alternate ibuprofen and tylenol every  3 hours. ?  ?Claritin OTC nightly ? ?- azithromycin (ZITHROMAX Z-PAK) 250 MG tablet; As directed  Dispense: 6 tablet; Refill: 0 ? ? ? ?The above assessment and management plan was discussed with the patient. The patient verbalized understanding of and has agreed to the management plan. Patient is aware to call the clinic if symptoms  persist or worsen. Patient is aware when to return to the clinic for a follow-up visit. Patient educated on when it is appropriate to go to the emergency department.  ? ?Mary-Margaret Hassell Done, FNP ? ? ?

## 2021-07-05 NOTE — Patient Instructions (Signed)

## 2021-07-14 DIAGNOSIS — G5602 Carpal tunnel syndrome, left upper limb: Secondary | ICD-10-CM | POA: Insufficient documentation

## 2021-07-26 ENCOUNTER — Ambulatory Visit (INDEPENDENT_AMBULATORY_CARE_PROVIDER_SITE_OTHER): Payer: Medicare HMO

## 2021-07-26 VITALS — Wt 155.0 lb

## 2021-07-26 DIAGNOSIS — Z0000 Encounter for general adult medical examination without abnormal findings: Secondary | ICD-10-CM

## 2021-07-26 NOTE — Patient Instructions (Signed)
Nicole Wheeler , ?Thank you for taking time to come for your Medicare Wellness Visit. I appreciate your ongoing commitment to your health goals. Please review the following plan we discussed and let me know if I can assist you in the future.  ? ?Screening recommendations/referrals: ?Colonoscopy: No longer required ?Mammogram: Done 10/13/2020 - Repeat annually ?Bone Density: Done 05/28/2020 - Repeat every 2 years  ?Recommended yearly ophthalmology/optometry visit for glaucoma screening and checkup ?Recommended yearly dental visit for hygiene and checkup ? ?Vaccinations: ?Influenza vaccine: Done 12/21/2020 - Repeat annually ?Pneumococcal vaccine: Done 2000 & 06/15/2014 *ask about [id number] ?Tdap vaccine: Done 04/05/2021 - Repeat in 10 years ?Shingles vaccine: Done 10/01/2020 & 04/05/2021     ?Covid-19: Done 04/04/2019, 04/25/2019, 01/10/2020, & 07/23/2020 ? ?Advanced directives: Please bring a copy of your health care power of attorney and living will to the office to be added to your chart at your convenience.  ? ?Conditions/risks identified: Aim for 30 minutes of exercise or brisk walking, 6-8 glasses of water, and 5 servings of fruits and vegetables each day.  ? ?Next appointment: Follow up in one year for your annual wellness visit  ? ? ?Preventive Care 86 Years and Older, Female ?Preventive care refers to lifestyle choices and visits with your health care provider that can promote health and wellness. ?What does preventive care include? ?A yearly physical exam. This is also called an annual well check. ?Dental exams once or twice a year. ?Routine eye exams. Ask your health care provider how often you should have your eyes checked. ?Personal lifestyle choices, including: ?Daily care of your teeth and gums. ?Regular physical activity. ?Eating a healthy diet. ?Avoiding tobacco and drug use. ?Limiting alcohol use. ?Practicing safe sex. ?Taking low-dose aspirin every day. ?Taking vitamin and mineral supplements as recommended by  your health care provider. ?What happens during an annual well check? ?The services and screenings done by your health care provider during your annual well check will depend on your age, overall health, lifestyle risk factors, and family history of disease. ?Counseling  ?Your health care provider may ask you questions about your: ?Alcohol use. ?Tobacco use. ?Drug use. ?Emotional well-being. ?Home and relationship well-being. ?Sexual activity. ?Eating habits. ?History of falls. ?Memory and ability to understand (cognition). ?Work and work Statistician. ?Reproductive health. ?Screening  ?You may have the following tests or measurements: ?Height, weight, and BMI. ?Blood pressure. ?Lipid and cholesterol levels. These may be checked every 5 years, or more frequently if you are over 29 years old. ?Skin check. ?Lung cancer screening. You may have this screening every year starting at age 35 if you have a 30-pack-year history of smoking and currently smoke or have quit within the past 15 years. ?Fecal occult blood test (FOBT) of the stool. You may have this test every year starting at age 8. ?Flexible sigmoidoscopy or colonoscopy. You may have a sigmoidoscopy every 5 years or a colonoscopy every 10 years starting at age 7. ?Hepatitis C blood test. ?Hepatitis B blood test. ?Sexually transmitted disease (STD) testing. ?Diabetes screening. This is done by checking your blood sugar (glucose) after you have not eaten for a while (fasting). You may have this done every 1-3 years. ?Bone density scan. This is done to screen for osteoporosis. You may have this done starting at age 81. ?Mammogram. This may be done every 1-2 years. Talk to your health care provider about how often you should have regular mammograms. ?Talk with your health care provider about your test results, treatment options,  and if necessary, the need for more tests. ?Vaccines  ?Your health care provider may recommend certain vaccines, such as: ?Influenza  vaccine. This is recommended every year. ?Tetanus, diphtheria, and acellular pertussis (Tdap, Td) vaccine. You may need a Td booster every 10 years. ?Zoster vaccine. You may need this after age 45. ?Pneumococcal 13-valent conjugate (PCV13) vaccine. One dose is recommended after age 16. ?Pneumococcal polysaccharide (PPSV23) vaccine. One dose is recommended after age 106. ?Talk to your health care provider about which screenings and vaccines you need and how often you need them. ?This information is not intended to replace advice given to you by your health care provider. Make sure you discuss any questions you have with your health care provider. ?Document Released: 03/26/2015 Document Revised: 11/17/2015 Document Reviewed: 12/29/2014 ?Elsevier Interactive Patient Education ? 2017 [place]. ? ?Fall Prevention in the Home ?Falls can cause injuries. They can happen to people of all ages. There are many things you can do to make your home safe and to help prevent falls. ?What can I do on the outside of my home? ?Regularly fix the edges of walkways and driveways and fix any cracks. ?Remove anything that might make you trip as you walk through a door, such as a raised step or threshold. ?Trim any bushes or trees on the path to your home. ?Use bright outdoor lighting. ?Clear any walking paths of anything that might make someone trip, such as rocks or tools. ?Regularly check to see if handrails are loose or broken. Make sure that both sides of any steps have handrails. ?Any raised decks and porches should have guardrails on the edges. ?Have any leaves, snow, or ice cleared regularly. ?Use sand or salt on walking paths during winter. ?Clean up any spills in your garage right away. This includes oil or grease spills. ?What can I do in the bathroom? ?Use night lights. ?Install grab bars by the toilet and in the tub and shower. Do not use towel bars as grab bars. ?Use non-skid mats or decals in the tub or shower. ?If you  need to sit down in the shower, use a plastic, non-slip stool. ?Keep the floor dry. Clean up any water that spills on the floor as soon as it happens. ?Remove soap buildup in the tub or shower regularly. ?Attach bath mats securely with double-sided non-slip rug tape. ?Do not have throw rugs and other things on the floor that can make you trip. ?What can I do in the bedroom? ?Use night lights. ?Make sure that you have a light by your bed that is easy to reach. ?Do not use any sheets or blankets that are too big for your bed. They should not hang down onto the floor. ?Have a firm chair that has side arms. You can use this for support while you get dressed. ?Do not have throw rugs and other things on the floor that can make you trip. ?What can I do in the kitchen? ?Clean up any spills right away. ?Avoid walking on wet floors. ?Keep items that you use a lot in easy-to-reach places. ?If you need to reach something above you, use a strong step stool that has a grab bar. ?Keep electrical cords out of the way. ?Do not use floor polish or wax that makes floors slippery. If you must use wax, use non-skid floor wax. ?Do not have throw rugs and other things on the floor that can make you trip. ?What can I do with my stairs? ?Do not leave  any items on the stairs. ?Make sure that there are handrails on both sides of the stairs and use them. Fix handrails that are broken or loose. Make sure that handrails are as long as the stairways. ?Check any carpeting to make sure that it is firmly attached to the stairs. Fix any carpet that is loose or worn. ?Avoid having throw rugs at the top or bottom of the stairs. If you do have throw rugs, attach them to the floor with carpet tape. ?Make sure that you have a light switch at the top of the stairs and the bottom of the stairs. If you do not have them, ask someone to add them for you. ?What else can I do to help prevent falls? ?Wear shoes that: ?Do not have high heels. ?Have rubber  bottoms. ?Are comfortable and fit you well. ?Are closed at the toe. Do not wear sandals. ?If you use a stepladder: ?Make sure that it is fully opened. Do not climb a closed stepladder. ?Make sure that both sides of the

## 2021-07-26 NOTE — Progress Notes (Signed)
? ?Subjective:  ? Nicole Wheeler is a 88 y.o. female who presents for Medicare Annual (Subsequent) preventive examination. ? ?Virtual Visit via Telephone Note ? ?I connected with  Nicole Wheeler on 07/26/21 at  2:00 PM EDT by telephone and verified that I am speaking with the correct person using two identifiers. ? ?Location: ?Patient: Home ?Provider: [facility] ?Persons participating in the virtual visit: patient/Nurse Health Advisor ?  ?I discussed the limitations, risks, security and privacy concerns of performing an evaluation and management service by telephone and the availability of in person appointments. The patient expressed understanding and agreed to proceed. ? ?Interactive audio and video telecommunications were attempted between this nurse and patient, however failed, due to patient having technical difficulties OR patient did not have access to video capability.  We continued and completed visit with audio only. ? ?Some vital signs may be absent or patient reported.  ? ?Nicole Deans Dionne Ano, LPN  ? ?Review of Systems    ? ?Cardiac Risk Factors include: advanced age (>80mn, >>28women);sedentary lifestyle;obesity (BMI >30kg/m2);hypertension;dyslipidemia ? ?   ?Objective:  ?  ?Today's Vitals  ? 07/26/21 1405  ?Weight: 155 lb (70.3 kg)  ?PainSc: 3   ? ?Body mass index is 30.27 kg/m?. ? ? ?  07/26/2021  ?  2:15 PM 07/23/2020  ?  4:31 PM 06/30/2019  ?  2:31 PM 09/20/2017  ? 10:58 AM 08/17/2015  ? 11:31 AM 09/17/2014  ? 11:41 AM 02/09/2014  ?  8:52 AM  ?Advanced Directives  ?Does Patient Have a Medical Advance Directive? Yes Yes Yes Yes Yes Yes Yes  ?Type of [place]amed[place]of [place] Living will;Healthcare Power of Attorney Living will;Healthcare Power of Attorney Living will;Healthcare Power of Attorney  Living will  ?Does patient want to make changes to medical advance directive?    No - Patient declined No - Patient declined    ?Copy of HDeshain Chart? No - copy requested No - copy requested No - copy requested No - copy requested No - copy requested    ? ? ?Current Medications (verified) ?Outpatient Encounter Medications as of 07/26/2021  ?Medication Sig  ? aspirin EC 81 MG tablet Take 81 mg by mouth daily.  ? brimonidine (ALPHAGAN) 0.2 % ophthalmic solution 1 drop 2 (two) times daily.  ? Calcium Carbonate-Vit D-Min (CALCIUM 1200 PO) Take by mouth.  ? Cholecalciferol (VITAMIN D) 2000 units CAPS Take 1,000 Units by mouth daily.  ? diphenhydramine-acetaminophen (TYLENOL PM) 25-500 MG TABS tablet Take 0.5 tablets by mouth at bedtime as needed.   ? dorzolamide (TRUSOPT) 2 % ophthalmic solution dorzolamide 2 % eye drops ? INSTILL 1 DROP INTO AFFECTED EYE(S) BY OPHTHALMIC ROUTE 3 TIMES PER DAY  ? fish oil-omega-3 fatty acids 1000 MG capsule Take 1 capsule by mouth daily.  ? hydrochlorothiazide (HYDRODIURIL) 25 MG tablet Take 1 tablet (25 mg total) by mouth daily.  ? latanoprost (XALATAN) 0.005 % ophthalmic solution   ? levothyroxine (SYNTHROID) 100 MCG tablet Take 1 tablet (100 mcg total) by mouth daily.  ? losartan (COZAAR) 100 MG tablet Take 1 tablet (100 mg total) by mouth daily.  ? Multiple Vitamins-Minerals (CENTRUM SILVER PO) Take by mouth.  ? Multiple Vitamins-Minerals (PRESERVISION/LUTEIN PO) Take by mouth 2 (two) times daily.  ? omeprazole (PRILOSEC) 20 MG capsule Take 1 capsule (20 mg total) by mouth daily.  ? triamcinolone cream (KENALOG) 0.1 % Apply 1 application topically 2 (  two) times daily as needed.  ? [DISCONTINUED] azithromycin (ZITHROMAX Z-PAK) 250 MG tablet As directed  ? [DISCONTINUED] brimonidine (ALPHAGAN) 0.15 % ophthalmic solution 1 drop 2 (two) times daily.  ? [DISCONTINUED] dorzolamide-timolol (COSOPT) 22.3-6.8 MG/ML ophthalmic solution 1 drop 2 (two) times daily.  ? ?No facility-administered encounter medications on file as of 07/26/2021.  ? ? ?Allergies (verified) ?Ace inhibitors, Crestor [rosuvastatin calcium],  and Lipitor [atorvastatin]  ? ?History: ?Past Medical History:  ?Diagnosis Date  ? Arthritis   ? Cataract   ? Glaucoma   ? Hyperlipidemia   ? Hypertension   ? Hypothyroidism   ? dr don Laurance Flatten   pcp  ? Macular degeneration   ? Osteopenia   ? SCC (squamous cell carcinoma) 09/30/2013  ? right post leg scc insitu cx3 48f  ? Shingles   ? Spinal stenosis   ? Squamous cell carcinoma of skin 09/30/2013  ? right hand scc well diff cx3 561f ? ?Past Surgical History:  ?Procedure Laterality Date  ? APPENDECTOMY    ? EYE SURGERY Right   ? cataract removal  ? LUMBAR LAMINECTOMY/DECOMPRESSION MICRODISCECTOMY  12/07/2011  ? Procedure: LUMBAR LAMINECTOMY/DECOMPRESSION MICRODISCECTOMY 1 LEVEL;  Surgeon: JeOphelia CharterMD;  [facility];  Service: Neurosurgery;  Laterality: Right;  Right Lumbar Five-Sacral One Diskectomy  ? throidectomy    ? TUBAL LIGATION    ? ?Family History  ?Problem Relation Age of Onset  ? Stroke Mother 8840? Hypertension Mother   ? Stroke Father 8784? Hypertension Father   ? Heart attack Sister 8847?     heart attack  ? Breast cancer Maternal Aunt   ? Healthy Daughter   ? Healthy Son   ? Healthy Son   ? ?Social History  ? ?Socioeconomic History  ? Marital status: Widowed  ?  Spouse name: Not on file  ? Number of children: 3  ? Years of education: 1264? Highest education level: Some college, no degree  ?Occupational History  ? Occupation: Retired  ?  Comment: secretarial work  ?Tobacco Use  ? Smoking status: Former  ?  Packs/day: 1.00  ?  Years: 25.00  ?  Pack years: 25.00  ?  Types: Cigarettes  ?  Quit date: 03/13/1978  ?  Years since quitting: 43.4  ? Smokeless tobacco: Never  ?Vaping Use  ? Vaping Use: Never used  ?Substance and Sexual Activity  ? Alcohol use: Yes  ?  Comment: occasional glass of wine  ? Drug use: No  ? Sexual activity: Never  ?Other Topics Concern  ? Not on file  ?Social History Narrative  ? PeKamarees retired and lives alone. She is widowed and has 3 grown children. She enjoys  sewing, quilting, and word puzzles. She is involved in her church.   ? ?Social Determinants of Health  ? ?Financial Resource Strain: Low Risk   ? Difficulty of Paying Living Expenses: Not hard at all  ?Food Insecurity: No Food Insecurity  ? Worried About RuCharity fundraisern the Last Year: Never true  ? Ran Out of Food in the Last Year: Never true  ?Transportation Needs: No Transportation Needs  ? Lack of Transportation (Medical): No  ? Lack of Transportation (Non-Medical): No  ?Physical Activity: Insufficiently Active  ? Days of Exercise per Week: 7 days  ? Minutes of Exercise per Session: 10 min  ?Stress: No Stress Concern Present  ? Feeling of Stress : Only a little  ?  Social Connections: Moderately Integrated  ? Frequency of Communication with Friends and Family: More than three times a week  ? Frequency of Social Gatherings with Friends and Family: More than three times a week  ? Attends Religious Services: More than 4 times per year  ? Active Member of Clubs or Organizations: Yes  ? Attends Archivist Meetings: More than 4 times per year  ? Marital Status: Widowed  ? ? ?Tobacco Counseling ?Counseling given: Not Answered ? ? ?Clinical Intake: ? ?Pre-visit preparation completed: Yes ? ?Pain : 0-10 ?Pain Score: 3  ?Pain Type: Chronic pain ?Pain Location: Wrist ?Pain Orientation: Left ?Pain Descriptors / Indicators: Aching ?Pain Onset: More than a month ago ?Pain Frequency: Intermittent ? ?  ? ?BMI - recorded: 30.27 ?Nutritional Status: BMI > 30  Obese ?Nutritional Risks: None ?Diabetes: No ? ?How often do you need to have someone help you when you read instructions, pamphlets, or other written materials from your doctor or pharmacy?: 1 - Never ? ?Diabetic? no ? ?Interpreter Needed?: No ? ?Information entered by :: Taraoluwa Thakur, LPN ? ? ?Activities of Daily Living ? ?  07/26/2021  ?  2:15 PM  ?In your present state of health, do you have any difficulty performing the following activities:  ?Hearing? 0   ?Vision? 0  ?Difficulty concentrating or making decisions? 0  ?Walking or climbing stairs? 0  ?Dressing or bathing? 0  ?Doing errands, shopping? 0  ?Preparing Food and eating ? N  ?Using the Toilet? N  ?In the pas

## 2021-08-12 DIAGNOSIS — G5603 Carpal tunnel syndrome, bilateral upper limbs: Secondary | ICD-10-CM | POA: Diagnosis not present

## 2021-10-03 ENCOUNTER — Encounter: Payer: Self-pay | Admitting: Nurse Practitioner

## 2021-10-03 ENCOUNTER — Ambulatory Visit (INDEPENDENT_AMBULATORY_CARE_PROVIDER_SITE_OTHER): Payer: Medicare HMO | Admitting: Nurse Practitioner

## 2021-10-03 VITALS — BP 137/70 | HR 64 | Temp 97.7°F | Resp 20 | Ht 60.0 in | Wt 156.0 lb

## 2021-10-03 DIAGNOSIS — E7800 Pure hypercholesterolemia, unspecified: Secondary | ICD-10-CM | POA: Diagnosis not present

## 2021-10-03 DIAGNOSIS — M8588 Other specified disorders of bone density and structure, other site: Secondary | ICD-10-CM

## 2021-10-03 DIAGNOSIS — E039 Hypothyroidism, unspecified: Secondary | ICD-10-CM

## 2021-10-03 DIAGNOSIS — Z6829 Body mass index (BMI) 29.0-29.9, adult: Secondary | ICD-10-CM

## 2021-10-03 DIAGNOSIS — H401132 Primary open-angle glaucoma, bilateral, moderate stage: Secondary | ICD-10-CM | POA: Diagnosis not present

## 2021-10-03 DIAGNOSIS — J302 Other seasonal allergic rhinitis: Secondary | ICD-10-CM

## 2021-10-03 DIAGNOSIS — H353132 Nonexudative age-related macular degeneration, bilateral, intermediate dry stage: Secondary | ICD-10-CM | POA: Diagnosis not present

## 2021-10-03 DIAGNOSIS — H0102A Squamous blepharitis right eye, upper and lower eyelids: Secondary | ICD-10-CM | POA: Diagnosis not present

## 2021-10-03 DIAGNOSIS — H02831 Dermatochalasis of right upper eyelid: Secondary | ICD-10-CM | POA: Diagnosis not present

## 2021-10-03 DIAGNOSIS — Z961 Presence of intraocular lens: Secondary | ICD-10-CM | POA: Diagnosis not present

## 2021-10-03 DIAGNOSIS — H02834 Dermatochalasis of left upper eyelid: Secondary | ICD-10-CM | POA: Diagnosis not present

## 2021-10-03 DIAGNOSIS — I10 Essential (primary) hypertension: Secondary | ICD-10-CM

## 2021-10-03 DIAGNOSIS — H04123 Dry eye syndrome of bilateral lacrimal glands: Secondary | ICD-10-CM | POA: Diagnosis not present

## 2021-10-03 DIAGNOSIS — H0102B Squamous blepharitis left eye, upper and lower eyelids: Secondary | ICD-10-CM | POA: Diagnosis not present

## 2021-10-03 MED ORDER — LORATADINE 10 MG PO TABS: 10.0000 mg | ORAL_TABLET | Freq: Every day | ORAL | 11 refills | Status: DC

## 2021-10-03 MED ORDER — FLUTICASONE PROPIONATE 50 MCG/ACT NA SUSP: 2.0000 | Freq: Every day | NASAL | 6 refills | Status: AC

## 2021-10-03 MED ORDER — LOSARTAN POTASSIUM 100 MG PO TABS: 100.0000 mg | ORAL_TABLET | Freq: Every day | ORAL | 1 refills | Status: DC

## 2021-10-03 MED ORDER — LEVOTHYROXINE SODIUM 100 MCG PO TABS: 100.0000 ug | ORAL_TABLET | Freq: Every day | ORAL | 1 refills | Status: DC

## 2021-10-03 MED ORDER — HYDROCHLOROTHIAZIDE 25 MG PO TABS: 25.0000 mg | ORAL_TABLET | Freq: Every day | ORAL | 1 refills | Status: DC

## 2021-10-03 NOTE — Progress Notes (Signed)
Subjective:    Patient ID: Nicole Wheeler, female    DOB: 05/05/1930, 88 y.o.   MRN: [id number]   Chief Complaint: medical management of chronic issues     HPI:  Nicole Wheeler is a 88 y.o. who identifies as a female who was assigned female at birth.   Social history: Lives with: by herself Work history: retired   Scientist, forensic in today for follow up of the following chronic medical issues:  1. Pure hypercholesterolemia Tries to watch diet and stays as active as she can. Lab Results  Component Value Date   CHOL 191 04/05/2021   HDL 45 04/05/2021   LDLCALC 122 (H) 04/05/2021   TRIG 134 04/05/2021   CHOLHDL 4.2 04/05/2021   The ASCVD Risk score (Arnett DK, et al., 2019) failed to calculate for the following reasons:   The 2019 ASCVD risk score is only valid for ages 23 to 72   2. Acquired hypothyroidism No problems that she is aware of. Lab Results  Component Value Date   TSH 1.550 04/05/2021     3. Osteopenia of lumbar spine Last bone density test was done on 05/27/20. Her t score was -2.3.   4. BMI 29.0-29.9,adult No recent weight changes Wt Readings from Last 3 Encounters:  10/03/21 156 lb (70.8 kg)  07/26/21 155 lb (70.3 kg)  07/05/21 155 lb (70.3 kg)   BMI Readings from Last 3 Encounters:  10/03/21 30.47 kg/m  07/26/21 30.27 kg/m  07/05/21 30.27 kg/m      New complaints: Has cough and hoarseness in mornings. Has congestion. Cough is productive but is clear phlegm.  Allergies  Allergen Reactions   Ace Inhibitors Cough   Crestor [Rosuvastatin Calcium]     Myalgia    Lipitor [Atorvastatin]     Myalgia    Outpatient Encounter Medications as of 10/03/2021  Medication Sig   aspirin EC 81 MG tablet Take 81 mg by mouth daily.   brimonidine (ALPHAGAN) 0.2 % ophthalmic solution 1 drop 2 (two) times daily.   Calcium Carbonate-Vit D-Min (CALCIUM 1200 PO) Take by mouth.   Cholecalciferol (VITAMIN D) 2000 units CAPS Take 1,000 Units by mouth daily.    diphenhydramine-acetaminophen (TYLENOL PM) 25-500 MG TABS tablet Take 0.5 tablets by mouth at bedtime as needed.    dorzolamide (TRUSOPT) 2 % ophthalmic solution dorzolamide 2 % eye drops  INSTILL 1 DROP INTO AFFECTED EYE(S) BY OPHTHALMIC ROUTE 3 TIMES PER DAY   fish oil-omega-3 fatty acids 1000 MG capsule Take 1 capsule by mouth daily.   hydrochlorothiazide (HYDRODIURIL) 25 MG tablet Take 1 tablet (25 mg total) by mouth daily.   latanoprost (XALATAN) 0.005 % ophthalmic solution    levothyroxine (SYNTHROID) 100 MCG tablet Take 1 tablet (100 mcg total) by mouth daily.   losartan (COZAAR) 100 MG tablet Take 1 tablet (100 mg total) by mouth daily.   Multiple Vitamins-Minerals (CENTRUM SILVER PO) Take by mouth.   Multiple Vitamins-Minerals (PRESERVISION/LUTEIN PO) Take by mouth 2 (two) times daily.   omeprazole (PRILOSEC) 20 MG capsule Take 1 capsule (20 mg total) by mouth daily.   triamcinolone cream (KENALOG) 0.1 % Apply 1 application topically 2 (two) times daily as needed.   No facility-administered encounter medications on file as of 10/03/2021.    Past Surgical History:  Procedure Laterality Date   APPENDECTOMY     EYE SURGERY Right    cataract removal   LUMBAR LAMINECTOMY/DECOMPRESSION MICRODISCECTOMY  12/07/2011   Procedure: LUMBAR LAMINECTOMY/DECOMPRESSION MICRODISCECTOMY 1 LEVEL;  Surgeon: Ophelia Charter, MD;  Location: [facility];  Service: Neurosurgery;  Laterality: Right;  Right Lumbar Five-Sacral One Diskectomy   throidectomy     TUBAL LIGATION      Family History  Problem Relation Age of Onset   Stroke Mother 73   Hypertension Mother    Stroke Father 62   Hypertension Father    Heart attack Sister 25       heart attack   Breast cancer Maternal Aunt    Healthy Daughter    Healthy Son    Healthy Son       Controlled substance contract: n/a     Review of Systems  Constitutional:  Negative for diaphoresis.  Eyes:  Negative for pain.  Respiratory:  Negative  for shortness of breath.   Cardiovascular:  Negative for chest pain, palpitations and leg swelling.  Gastrointestinal:  Negative for abdominal pain.  Endocrine: Negative for polydipsia.  Skin:  Negative for rash.  Neurological:  Negative for dizziness, weakness and headaches.  Hematological:  Does not bruise/bleed easily.  All other systems reviewed and are negative.      Objective:   Physical Exam Vitals and nursing note reviewed.  Constitutional:      General: She is not in acute distress.    Appearance: Normal appearance. She is well-developed.  HENT:     Head: Normocephalic.     Right Ear: Tympanic membrane normal.     Left Ear: Tympanic membrane normal.     Nose: Nose normal.     Mouth/Throat:     Mouth: Mucous membranes are moist.  Eyes:     Pupils: Pupils are equal, round, and reactive to light.  Neck:     Vascular: No carotid bruit or JVD.  Cardiovascular:     Rate and Rhythm: Normal rate and regular rhythm.     Heart sounds: Normal heart sounds.  Pulmonary:     Effort: Pulmonary effort is normal. No respiratory distress.     Breath sounds: Normal breath sounds. No wheezing or rales.  Chest:     Chest wall: No tenderness.  Abdominal:     General: Bowel sounds are normal. There is no distension or abdominal bruit.     Palpations: Abdomen is soft. There is no hepatomegaly, splenomegaly, mass or pulsatile mass.     Tenderness: There is no abdominal tenderness.  Musculoskeletal:        General: Normal range of motion.     Cervical back: Normal range of motion and neck supple.     Right lower leg: Edema (1+) present.     Left lower leg: Edema (1+) present.  Lymphadenopathy:     Cervical: No cervical adenopathy.  Skin:    General: Skin is warm and dry.  Neurological:     Mental Status: She is alert and oriented to person, place, and time.     Deep Tendon Reflexes: Reflexes are normal and symmetric.  Psychiatric:        Behavior: Behavior normal.        Thought  Content: Thought content normal.        Judgment: Judgment normal.     BP 137/70 Comment: at home this morning  Pulse 64   Temp 97.7 F (36.5 C) (Temporal)   Resp 20   Ht 5' (1.524 m)   Wt 156 lb (70.8 kg)   SpO2 98%   BMI 30.47 kg/m         Assessment & Plan:  Nicole Wheeler comes in today with chief complaint of Medical Management of Chronic Issues   Diagnosis and orders addressed:  1. Pure hypercholesterolemia Low fat diet - CBC with Differential/Platelet - CMP14+EGFR - Lipid panel  2. Acquired hypothyroidism Labs pending - Thyroid Panel With TSH - levothyroxine (SYNTHROID) 100 MCG tablet; Take 1 tablet (100 mcg total) by mouth daily.  Dispense: 90 tablet; Refill: 1  3. Osteopenia of lumbar spine Weight bearing exercises  4. BMI 29.0-29.9,adult Discussed diet and exercise for person with BMI >25 Will recheck weight in 3-6 months   5. Primary hypertension Low sodium diet - hydrochlorothiazide (HYDRODIURIL) 25 MG tablet; Take 1 tablet (25 mg total) by mouth daily.  Dispense: 90 tablet; Refill: 1 - losartan (COZAAR) 100 MG tablet; Take 1 tablet (100 mg total) by mouth daily.  Dispense: 90 tablet; Refill: 1  6. Seasonal allergic rhinitis, unspecified trigger - fluticasone (FLONASE) 50 MCG/ACT nasal spray; Place 2 sprays into both nostrils daily.  Dispense: 16 g; Refill: 6 - loratadine (CLARITIN) 10 MG tablet; Take 1 tablet (10 mg total) by mouth daily.  Dispense: 30 tablet; Refill: 11   Labs pending Health Maintenance reviewed Diet and exercise encouraged  Follow up plan: 6 months   Mary-Margaret Hassell Done, FNP

## 2021-10-04 LAB — CBC WITH DIFFERENTIAL/PLATELET
Basophils Absolute: 0.1 10*3/uL (ref 0.0–0.2)
Basos: 1 %
EOS (ABSOLUTE): 0.3 10*3/uL (ref 0.0–0.4)
Eos: 3 %
Hematocrit: 35.7 % (ref 34.0–46.6)
Hemoglobin: 11.4 g/dL (ref 11.1–15.9)
Immature Grans (Abs): 0 10*3/uL (ref 0.0–0.1)
Immature Granulocytes: 0 %
Lymphocytes Absolute: 1.2 10*3/uL (ref 0.7–3.1)
Lymphs: 13 %
MCH: 29.5 pg (ref 26.6–33.0)
MCHC: 31.9 g/dL (ref 31.5–35.7)
MCV: 92 fL (ref 79–97)
Monocytes Absolute: 0.7 10*3/uL (ref 0.1–0.9)
Monocytes: 7 %
Neutrophils Absolute: 7.2 10*3/uL — ABNORMAL HIGH (ref 1.4–7.0)
Neutrophils: 76 %
Platelets: 309 10*3/uL (ref 150–450)
RBC: 3.87 x10E6/uL (ref 3.77–5.28)
RDW: 12.4 % (ref 11.7–15.4)
WBC: 9.4 10*3/uL (ref 3.4–10.8)

## 2021-10-04 LAB — THYROID PANEL WITH TSH
Free Thyroxine Index: 2.8 (ref 1.2–4.9)
T3 Uptake Ratio: 33 % (ref 24–39)
T4, Total: 8.6 ug/dL (ref 4.5–12.0)
TSH: 1.32 u[IU]/mL (ref 0.450–4.500)

## 2021-10-04 LAB — LIPID PANEL
Chol/HDL Ratio: 4.9 ratio — ABNORMAL HIGH (ref 0.0–4.4)
Cholesterol, Total: 199 mg/dL (ref 100–199)
HDL: 41 mg/dL (ref >39)
LDL Chol Calc (NIH): 128 mg/dL — ABNORMAL HIGH (ref 0–99)
Triglycerides: 168 mg/dL — ABNORMAL HIGH (ref 0–149)
VLDL Cholesterol Cal: 30 mg/dL (ref 5–40)

## 2021-10-04 LAB — CMP14+EGFR
ALT: 14 IU/L (ref 0–32)
AST: 17 IU/L (ref 0–40)
Albumin/Globulin Ratio: 1.4 (ref 1.2–2.2)
Albumin: 4 g/dL (ref 3.7–4.7)
Alkaline Phosphatase: 116 IU/L (ref 44–121)
BUN/Creatinine Ratio: 32 — ABNORMAL HIGH (ref 12–28)
BUN: 38 mg/dL — ABNORMAL HIGH (ref 8–27)
Bilirubin Total: <0.2 mg/dL (ref 0.0–1.2)
CO2: 23 mmol/L (ref 20–29)
Calcium: 10.3 mg/dL (ref 8.7–10.3)
Chloride: 100 mmol/L (ref 96–106)
Creatinine, Ser: 1.18 mg/dL — ABNORMAL HIGH (ref 0.57–1.00)
Globulin, Total: 2.8 g/dL (ref 1.5–4.5)
Glucose: 97 mg/dL (ref 70–99)
Potassium: 4.6 mmol/L (ref 3.5–5.2)
Sodium: 138 mmol/L (ref 134–144)
Total Protein: 6.8 g/dL (ref 6.0–8.5)
eGFR: 44 mL/min/{1.73_m2} — ABNORMAL LOW (ref >59)

## 2021-10-10 DIAGNOSIS — G5603 Carpal tunnel syndrome, bilateral upper limbs: Secondary | ICD-10-CM | POA: Diagnosis not present

## 2021-11-08 ENCOUNTER — Other Ambulatory Visit: Payer: Self-pay | Admitting: Nurse Practitioner

## 2021-11-08 DIAGNOSIS — Z1231 Encounter for screening mammogram for malignant neoplasm of breast: Secondary | ICD-10-CM

## 2021-12-02 ENCOUNTER — Ambulatory Visit
Admission: RE | Admit: 2021-12-02 | Discharge: 2021-12-02 | Disposition: A | Discharge status: Home / Self Care | Payer: Medicare HMO | Source: Ambulatory Visit | Attending: Nurse Practitioner | Admitting: Nurse Practitioner

## 2021-12-02 DIAGNOSIS — Z1231 Encounter for screening mammogram for malignant neoplasm of breast: Secondary | ICD-10-CM

## 2022-01-19 DIAGNOSIS — Z85828 Personal history of other malignant neoplasm of skin: Secondary | ICD-10-CM | POA: Diagnosis not present

## 2022-01-19 DIAGNOSIS — X32XXXA Exposure to sunlight, initial encounter: Secondary | ICD-10-CM | POA: Diagnosis not present

## 2022-01-19 DIAGNOSIS — Z08 Encounter for follow-up examination after completed treatment for malignant neoplasm: Secondary | ICD-10-CM | POA: Diagnosis not present

## 2022-01-19 DIAGNOSIS — D225 Melanocytic nevi of trunk: Secondary | ICD-10-CM | POA: Diagnosis not present

## 2022-01-19 DIAGNOSIS — L570 Actinic keratosis: Secondary | ICD-10-CM | POA: Diagnosis not present

## 2022-01-19 DIAGNOSIS — L820 Inflamed seborrheic keratosis: Secondary | ICD-10-CM | POA: Diagnosis not present

## 2022-02-24 DIAGNOSIS — H353132 Nonexudative age-related macular degeneration, bilateral, intermediate dry stage: Secondary | ICD-10-CM | POA: Diagnosis not present

## 2022-02-24 DIAGNOSIS — H04123 Dry eye syndrome of bilateral lacrimal glands: Secondary | ICD-10-CM | POA: Diagnosis not present

## 2022-02-24 DIAGNOSIS — H02834 Dermatochalasis of left upper eyelid: Secondary | ICD-10-CM | POA: Diagnosis not present

## 2022-02-24 DIAGNOSIS — H401132 Primary open-angle glaucoma, bilateral, moderate stage: Secondary | ICD-10-CM | POA: Diagnosis not present

## 2022-02-24 DIAGNOSIS — Z961 Presence of intraocular lens: Secondary | ICD-10-CM | POA: Diagnosis not present

## 2022-02-24 DIAGNOSIS — H02831 Dermatochalasis of right upper eyelid: Secondary | ICD-10-CM | POA: Diagnosis not present

## 2022-02-24 DIAGNOSIS — H0102B Squamous blepharitis left eye, upper and lower eyelids: Secondary | ICD-10-CM | POA: Diagnosis not present

## 2022-02-24 DIAGNOSIS — H0102A Squamous blepharitis right eye, upper and lower eyelids: Secondary | ICD-10-CM | POA: Diagnosis not present

## 2022-02-26 IMAGING — MG DIGITAL SCREENING BILAT W/ TOMO W/ CAD
6 of 10 series · 6 of 30 positions shown · non-contrast
Comparison: Previous exam(s).

CLINICAL DATA: Screening.

EXAM:
DIGITAL SCREENING BILATERAL MAMMOGRAM WITH TOMO AND CAD

[R MLO synth-2D (1 of 2)]
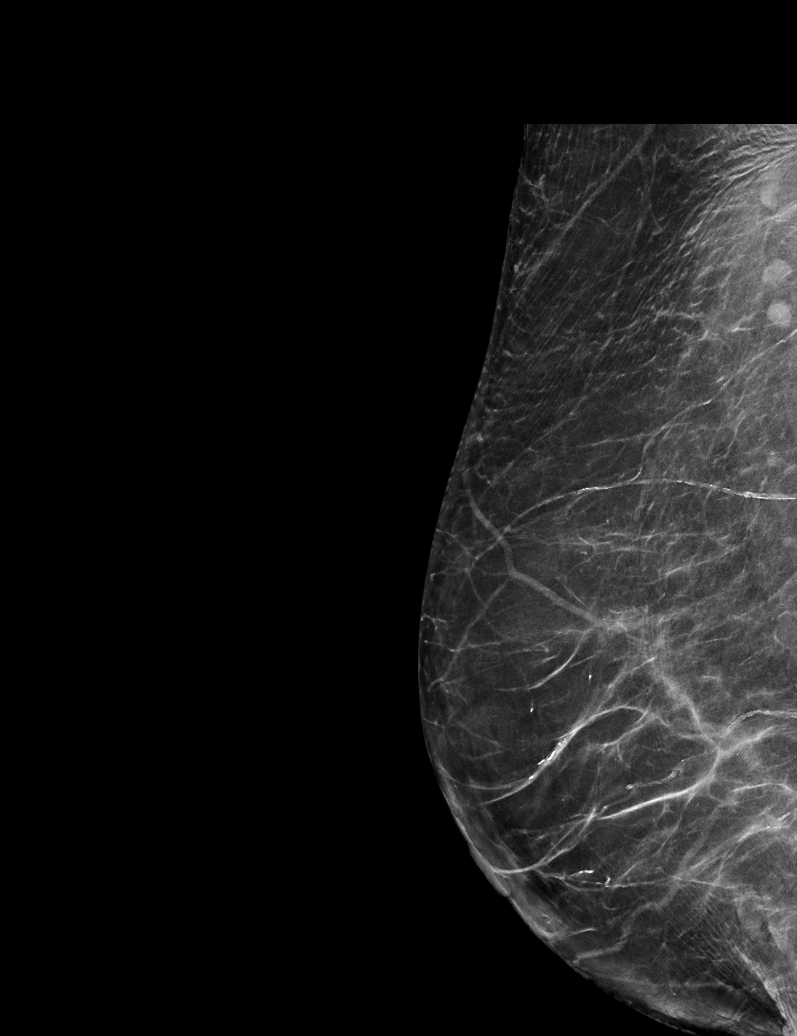

[L MLO synth-2D]
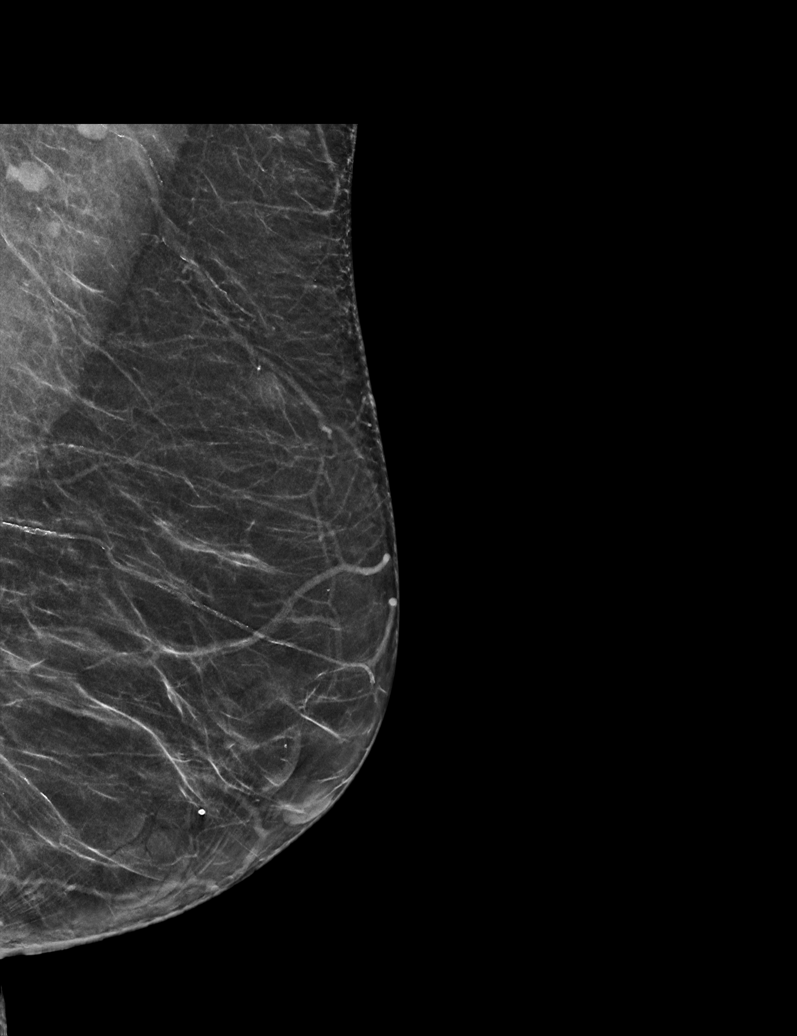

[R MLO synth-2D (2 of 2)]
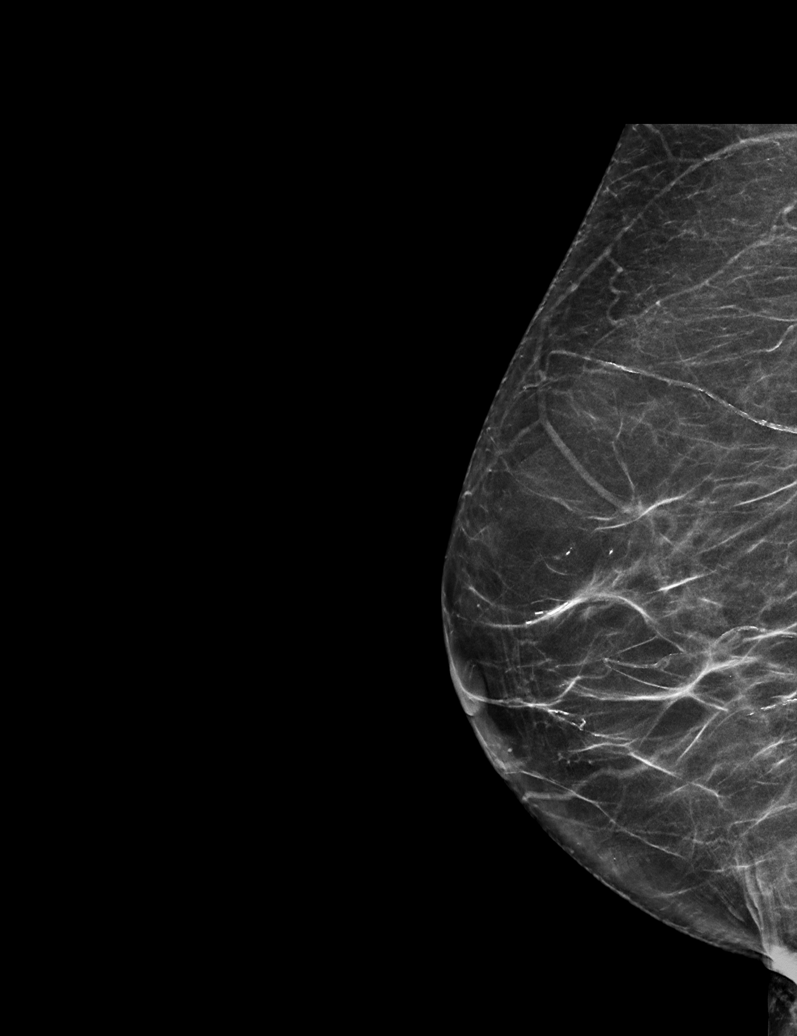

[L CC synth-2D]
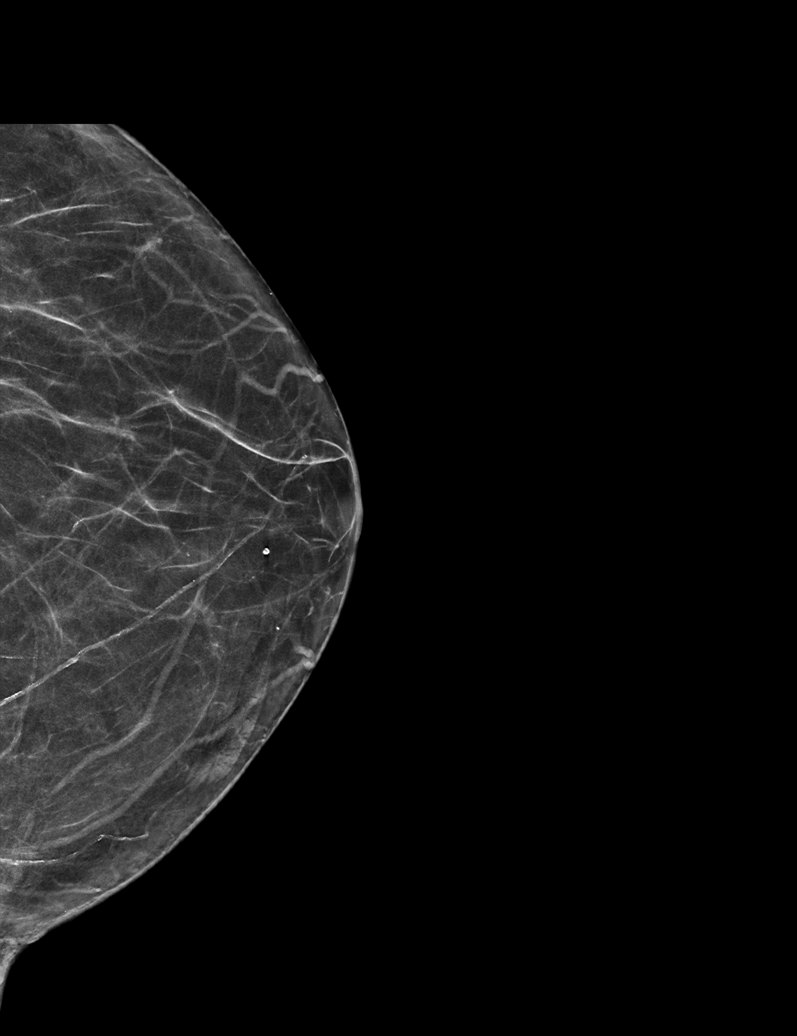

[R CC synth-2D]
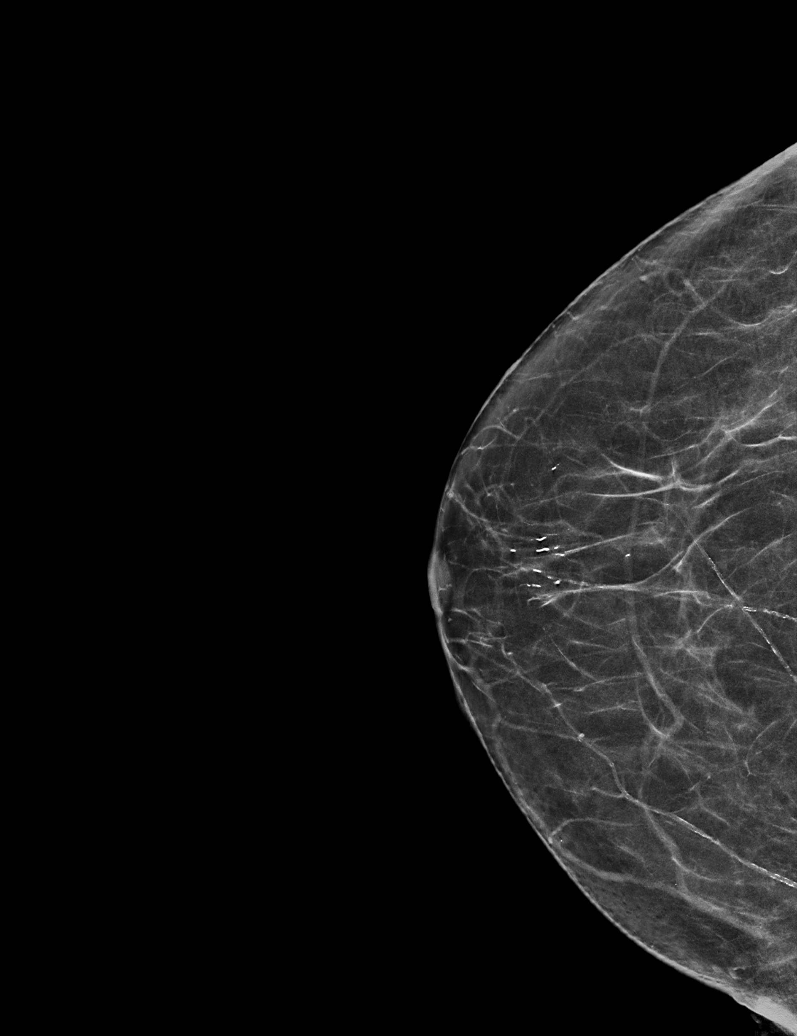

[L CC tomo · tomo slice 29/56.0]
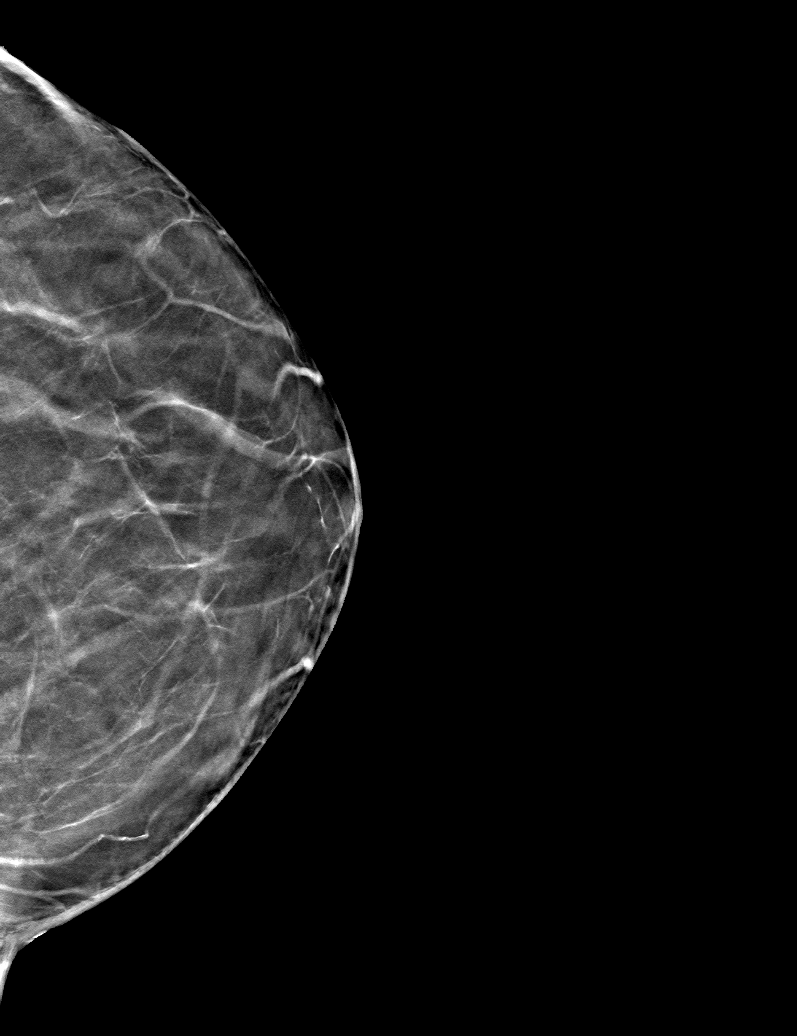

[6 of 30 positions shown; findings below may reference images not displayed]

ACR Breast Density Category b: There are scattered areas of
fibroglandular density.
FINDINGS: There are no findings suspicious for malignancy. Images were
processed with CAD.
IMPRESSION: No mammographic evidence of malignancy. A result letter of this
screening mammogram will be mailed directly to the patient.

RECOMMENDATION:
Screening mammogram in one year. (Code:[id number])

BI-RADS CATEGORY  1: Negative.

## 2022-03-16 DIAGNOSIS — L570 Actinic keratosis: Secondary | ICD-10-CM | POA: Diagnosis not present

## 2022-03-16 DIAGNOSIS — X32XXXD Exposure to sunlight, subsequent encounter: Secondary | ICD-10-CM | POA: Diagnosis not present

## 2022-03-16 DIAGNOSIS — L308 Other specified dermatitis: Secondary | ICD-10-CM | POA: Diagnosis not present

## 2022-04-06 ENCOUNTER — Ambulatory Visit (INDEPENDENT_AMBULATORY_CARE_PROVIDER_SITE_OTHER): Payer: Medicare HMO | Admitting: Nurse Practitioner

## 2022-04-06 ENCOUNTER — Encounter: Payer: Self-pay | Admitting: Nurse Practitioner

## 2022-04-06 VITALS — BP 136/78 | HR 70 | Temp 98.2°F | Resp 20 | Ht 60.0 in | Wt 156.0 lb

## 2022-04-06 DIAGNOSIS — I10 Essential (primary) hypertension: Secondary | ICD-10-CM

## 2022-04-06 DIAGNOSIS — Z6829 Body mass index (BMI) 29.0-29.9, adult: Secondary | ICD-10-CM | POA: Diagnosis not present

## 2022-04-06 DIAGNOSIS — J302 Other seasonal allergic rhinitis: Secondary | ICD-10-CM

## 2022-04-06 DIAGNOSIS — E039 Hypothyroidism, unspecified: Secondary | ICD-10-CM | POA: Diagnosis not present

## 2022-04-06 DIAGNOSIS — M8588 Other specified disorders of bone density and structure, other site: Secondary | ICD-10-CM | POA: Diagnosis not present

## 2022-04-06 DIAGNOSIS — E7800 Pure hypercholesterolemia, unspecified: Secondary | ICD-10-CM

## 2022-04-06 MED ORDER — LEVOTHYROXINE SODIUM 100 MCG PO TABS: 100.0000 ug | ORAL_TABLET | Freq: Every day | ORAL | 1 refills | Status: DC

## 2022-04-06 MED ORDER — LORATADINE 10 MG PO TABS: 10.0000 mg | ORAL_TABLET | Freq: Every day | ORAL | 11 refills | Status: DC

## 2022-04-06 MED ORDER — HYDROCHLOROTHIAZIDE 25 MG PO TABS: 25.0000 mg | ORAL_TABLET | Freq: Every day | ORAL | 1 refills | Status: DC

## 2022-04-06 MED ORDER — LOSARTAN POTASSIUM 100 MG PO TABS: 100.0000 mg | ORAL_TABLET | Freq: Every day | ORAL | 1 refills | Status: DC

## 2022-04-06 NOTE — Patient Instructions (Signed)

## 2022-04-06 NOTE — Progress Notes (Signed)
Subjective:    Patient ID: CHARRON Wheeler, female    DOB: Jun 04, 1930, 89 y.o.   MRN: [id number]   Chief Complaint: medical management of chronic issues     HPI:  Nicole Wheeler is a 89 y.o. who identifies as a female who was assigned female at birth.   Social history: Lives with: by herself- family checks on her daily Work history: retired   Scientist, forensic in today for follow up of the following chronic medical issues:  1. Primary hypertension No c/o chest pain, sob or headache. Does not check blood pressure at home. BP Readings from Last 3 Encounters:  10/03/21 137/70  07/05/21 109/61  04/15/21 (!) 154/98     2. Pure hypercholesterolemia Does try to watch diet but does not do much exercise. Refuses statin therapy, cause bad myalgia Lab Results  Component Value Date   CHOL 199 10/03/2021   HDL 41 10/03/2021   LDLCALC 128 (H) 10/03/2021   TRIG 168 (H) 10/03/2021   CHOLHDL 4.9 (H) 10/03/2021     3. Acquired hypothyroidism No problems that she is aware of. Lab Results  Component Value Date   TSH 1.320 10/03/2021     4. Osteopenia of lumbar spine /Last dexascan was done on 05/27/20. Her  t score was -2.3. does not want to do anymore  5. BMI 29.0-29.9,adult No recent weight changes Wt Readings from Last 3 Encounters:  04/06/22 156 lb (70.8 kg)  10/03/21 156 lb (70.8 kg)  07/26/21 155 lb (70.3 kg)   BMI Readings from Last 3 Encounters:  04/06/22 30.47 kg/m  10/03/21 30.47 kg/m  07/26/21 30.27 kg/m      New complaints: None today  Allergies  Allergen Reactions   Ace Inhibitors Cough   Crestor [Rosuvastatin Calcium]     Myalgia    Lipitor [Atorvastatin]     Myalgia    Outpatient Encounter Medications as of 04/06/2022  Medication Sig   aspirin EC 81 MG tablet Take 81 mg by mouth daily.   brimonidine (ALPHAGAN) 0.2 % ophthalmic solution 1 drop 2 (two) times daily.   Calcium Carbonate-Vit D-Min (CALCIUM 1200 PO) Take by mouth.   Cholecalciferol  (VITAMIN D) 2000 units CAPS Take 1,000 Units by mouth daily.   diphenhydramine-acetaminophen (TYLENOL PM) 25-500 MG TABS tablet Take 0.5 tablets by mouth at bedtime as needed.    dorzolamide (TRUSOPT) 2 % ophthalmic solution dorzolamide 2 % eye drops  INSTILL 1 DROP INTO AFFECTED EYE(S) BY OPHTHALMIC ROUTE 3 TIMES PER DAY   fish oil-omega-3 fatty acids 1000 MG capsule Take 1 capsule by mouth daily.   fluticasone (FLONASE) 50 MCG/ACT nasal spray Place 2 sprays into both nostrils daily.   hydrochlorothiazide (HYDRODIURIL) 25 MG tablet Take 1 tablet (25 mg total) by mouth daily.   latanoprost (XALATAN) 0.005 % ophthalmic solution    levothyroxine (SYNTHROID) 100 MCG tablet Take 1 tablet (100 mcg total) by mouth daily.   loratadine (CLARITIN) 10 MG tablet Take 1 tablet (10 mg total) by mouth daily.   losartan (COZAAR) 100 MG tablet Take 1 tablet (100 mg total) by mouth daily.   Multiple Vitamins-Minerals (CENTRUM SILVER PO) Take by mouth.   Multiple Vitamins-Minerals (PRESERVISION/LUTEIN PO) Take by mouth 2 (two) times daily.   triamcinolone cream (KENALOG) 0.1 % Apply 1 application topically 2 (two) times daily as needed.   No facility-administered encounter medications on file as of 04/06/2022.    Past Surgical History:  Procedure Laterality Date   APPENDECTOMY  EYE SURGERY Right    cataract removal   LUMBAR LAMINECTOMY/DECOMPRESSION MICRODISCECTOMY  12/07/2011   Procedure: LUMBAR LAMINECTOMY/DECOMPRESSION MICRODISCECTOMY 1 LEVEL;  Surgeon: Ophelia Charter, MD;  Location: [facility];  Service: Neurosurgery;  Laterality: Right;  Right Lumbar Five-Sacral One Diskectomy   throidectomy     TUBAL LIGATION      Family History  Problem Relation Age of Onset   Stroke Mother 10   Hypertension Mother    Stroke Father 2   Hypertension Father    Heart attack Sister 17       heart attack   Breast cancer Maternal Aunt    Healthy Daughter    Healthy Son    Healthy Son        Controlled substance contract: n/a     Review of Systems  Constitutional:  Negative for diaphoresis.  Eyes:  Negative for pain.  Respiratory:  Negative for shortness of breath.   Cardiovascular:  Negative for chest pain, palpitations and leg swelling.  Gastrointestinal:  Negative for abdominal pain.  Endocrine: Negative for polydipsia.  Skin:  Negative for rash.  Neurological:  Negative for dizziness, weakness and headaches.  Hematological:  Does not bruise/bleed easily.  All other systems reviewed and are negative.      Objective:   Physical Exam Vitals and nursing note reviewed.  Constitutional:      General: She is not in acute distress.    Appearance: Normal appearance. She is well-developed.  HENT:     Head: Normocephalic.     Right Ear: Tympanic membrane normal.     Left Ear: Tympanic membrane normal.     Nose: Nose normal.     Mouth/Throat:     Mouth: Mucous membranes are moist.  Eyes:     Pupils: Pupils are equal, round, and reactive to light.  Neck:     Vascular: No carotid bruit or JVD.  Cardiovascular:     Rate and Rhythm: Normal rate and regular rhythm.     Heart sounds: Normal heart sounds.  Pulmonary:     Effort: Pulmonary effort is normal. No respiratory distress.     Breath sounds: Normal breath sounds. No wheezing or rales.  Chest:     Chest wall: No tenderness.  Abdominal:     General: Bowel sounds are normal. There is no distension or abdominal bruit.     Palpations: Abdomen is soft. There is no hepatomegaly, splenomegaly, mass or pulsatile mass.     Tenderness: There is no abdominal tenderness.  Musculoskeletal:        General: Normal range of motion.     Cervical back: Normal range of motion and neck supple.  Lymphadenopathy:     Cervical: No cervical adenopathy.  Skin:    General: Skin is warm and dry.  Neurological:     Mental Status: She is alert and oriented to person, place, and time.     Deep Tendon Reflexes: Reflexes are  normal and symmetric.  Psychiatric:        Behavior: Behavior normal.        Thought Content: Thought content normal.        Judgment: Judgment normal.     BP 136/78   Pulse 70   Temp 98.2 F (36.8 C) (Temporal)   Resp 20   Ht 5' (1.524 m)   Wt 156 lb (70.8 kg)   SpO2 98%   BMI 30.47 kg/m         Assessment & Plan:  Ota DINIA JOYNT comes in today with chief complaint of Medical Management of Chronic Issues   Diagnosis and orders addressed:  1. Primary hypertension Low sodium diet - hydrochlorothiazide (HYDRODIURIL) 25 MG tablet; Take 1 tablet (25 mg total) by mouth daily.  Dispense: 90 tablet; Refill: 1 - losartan (COZAAR) 100 MG tablet; Take 1 tablet (100 mg total) by mouth daily.  Dispense: 90 tablet; Refill: 1 - CBC with Differential/Platelet - CMP14+EGFR  2. Pure hypercholesterolemia Low fat diet - Lipid panel  3. Acquired hypothyroidism Labs  pending - levothyroxine (SYNTHROID) 100 MCG tablet; Take 1 tablet (100 mcg total) by mouth daily.  Dispense: 90 tablet; Refill: 1 - Thyroid Panel With TSH  4. Osteopenia of lumbar spine Weight bearing exercises  5. BMI 29.0-29.9,adult Discussed diet and exercise for person with BMI >25 Will recheck weight in 3-6 months    Labs pending Health Maintenance reviewed Diet and exercise encouraged  Follow up plan: 6 months   Mary-Margaret Hassell Done, FNP

## 2022-04-07 LAB — CBC WITH DIFFERENTIAL/PLATELET
Basophils Absolute: 0.1 10*3/uL (ref 0.0–0.2)
Basos: 1 %
EOS (ABSOLUTE): 0.2 10*3/uL (ref 0.0–0.4)
Eos: 3 %
Hematocrit: 35.1 % (ref 34.0–46.6)
Hemoglobin: 11.4 g/dL (ref 11.1–15.9)
Immature Grans (Abs): 0 10*3/uL (ref 0.0–0.1)
Immature Granulocytes: 0 %
Lymphocytes Absolute: 1.3 10*3/uL (ref 0.7–3.1)
Lymphs: 16 %
MCH: 29.7 pg (ref 26.6–33.0)
MCHC: 32.5 g/dL (ref 31.5–35.7)
MCV: 91 fL (ref 79–97)
Monocytes Absolute: 0.7 10*3/uL (ref 0.1–0.9)
Monocytes: 8 %
Neutrophils Absolute: 6.2 10*3/uL (ref 1.4–7.0)
Neutrophils: 72 %
Platelets: 282 10*3/uL (ref 150–450)
RBC: 3.84 x10E6/uL (ref 3.77–5.28)
RDW: 12.4 % (ref 11.7–15.4)
WBC: 8.5 10*3/uL (ref 3.4–10.8)

## 2022-04-07 LAB — CMP14+EGFR
ALT: 17 IU/L (ref 0–32)
AST: 21 IU/L (ref 0–40)
Albumin/Globulin Ratio: 1.6 (ref 1.2–2.2)
Albumin: 4.1 g/dL (ref 3.7–4.7)
Alkaline Phosphatase: 131 IU/L — ABNORMAL HIGH (ref 44–121)
BUN/Creatinine Ratio: 25 (ref 12–28)
BUN: 33 mg/dL — ABNORMAL HIGH (ref 8–27)
Bilirubin Total: <0.2 mg/dL (ref 0.0–1.2)
CO2: 23 mmol/L (ref 20–29)
Calcium: 9.2 mg/dL (ref 8.7–10.3)
Chloride: 103 mmol/L (ref 96–106)
Creatinine, Ser: 1.3 mg/dL — ABNORMAL HIGH (ref 0.57–1.00)
Globulin, Total: 2.5 g/dL (ref 1.5–4.5)
Glucose: 104 mg/dL — ABNORMAL HIGH (ref 70–99)
Potassium: 4.4 mmol/L (ref 3.5–5.2)
Sodium: 142 mmol/L (ref 134–144)
Total Protein: 6.6 g/dL (ref 6.0–8.5)
eGFR: 39 mL/min/{1.73_m2} — ABNORMAL LOW (ref >59)

## 2022-04-07 LAB — LIPID PANEL
Chol/HDL Ratio: 4.4 ratio (ref 0.0–4.4)
Cholesterol, Total: 191 mg/dL (ref 100–199)
HDL: 43 mg/dL (ref >39)
LDL Chol Calc (NIH): 125 mg/dL — ABNORMAL HIGH (ref 0–99)
Triglycerides: 130 mg/dL (ref 0–149)
VLDL Cholesterol Cal: 23 mg/dL (ref 5–40)

## 2022-04-07 LAB — THYROID PANEL WITH TSH
Free Thyroxine Index: 2.6 (ref 1.2–4.9)
T3 Uptake Ratio: 33 % (ref 24–39)
T4, Total: 7.8 ug/dL (ref 4.5–12.0)
TSH: 1.11 u[IU]/mL (ref 0.450–4.500)

## 2022-04-10 DIAGNOSIS — H0102A Squamous blepharitis right eye, upper and lower eyelids: Secondary | ICD-10-CM | POA: Diagnosis not present

## 2022-04-10 DIAGNOSIS — H02834 Dermatochalasis of left upper eyelid: Secondary | ICD-10-CM | POA: Diagnosis not present

## 2022-04-10 DIAGNOSIS — H401132 Primary open-angle glaucoma, bilateral, moderate stage: Secondary | ICD-10-CM | POA: Diagnosis not present

## 2022-04-10 DIAGNOSIS — H04123 Dry eye syndrome of bilateral lacrimal glands: Secondary | ICD-10-CM | POA: Diagnosis not present

## 2022-04-10 DIAGNOSIS — H353132 Nonexudative age-related macular degeneration, bilateral, intermediate dry stage: Secondary | ICD-10-CM | POA: Diagnosis not present

## 2022-04-10 DIAGNOSIS — H0102B Squamous blepharitis left eye, upper and lower eyelids: Secondary | ICD-10-CM | POA: Diagnosis not present

## 2022-04-10 DIAGNOSIS — H02831 Dermatochalasis of right upper eyelid: Secondary | ICD-10-CM | POA: Diagnosis not present

## 2022-04-10 DIAGNOSIS — Z961 Presence of intraocular lens: Secondary | ICD-10-CM | POA: Diagnosis not present

## 2022-07-11 ENCOUNTER — Telehealth: Payer: Self-pay | Admitting: Nurse Practitioner

## 2022-07-11 NOTE — Telephone Encounter (Signed)
Nicole Wheeler scheduled their annual wellness visit. Appointment made for 07/28/2022.  Thank you,  Judeth Cornfield,  AMB Clinical Support [facility] AWV Program Direct Dial ??[phone]

## 2022-07-27 DIAGNOSIS — Z08 Encounter for follow-up examination after completed treatment for malignant neoplasm: Secondary | ICD-10-CM | POA: Diagnosis not present

## 2022-07-27 DIAGNOSIS — L568 Other specified acute skin changes due to ultraviolet radiation: Secondary | ICD-10-CM | POA: Diagnosis not present

## 2022-07-27 DIAGNOSIS — Z85828 Personal history of other malignant neoplasm of skin: Secondary | ICD-10-CM | POA: Diagnosis not present

## 2022-07-27 DIAGNOSIS — D0001 Carcinoma in situ of labial mucosa and vermilion border: Secondary | ICD-10-CM | POA: Diagnosis not present

## 2022-07-27 DIAGNOSIS — D1801 Hemangioma of skin and subcutaneous tissue: Secondary | ICD-10-CM | POA: Diagnosis not present

## 2022-07-27 DIAGNOSIS — L570 Actinic keratosis: Secondary | ICD-10-CM | POA: Diagnosis not present

## 2022-07-27 DIAGNOSIS — D040 Carcinoma in situ of skin of lip: Secondary | ICD-10-CM | POA: Diagnosis not present

## 2022-07-27 DIAGNOSIS — Z1283 Encounter for screening for malignant neoplasm of skin: Secondary | ICD-10-CM | POA: Diagnosis not present

## 2022-07-27 DIAGNOSIS — D485 Neoplasm of uncertain behavior of skin: Secondary | ICD-10-CM | POA: Diagnosis not present

## 2022-07-27 DIAGNOSIS — L821 Other seborrheic keratosis: Secondary | ICD-10-CM | POA: Diagnosis not present

## 2022-07-28 ENCOUNTER — Ambulatory Visit (INDEPENDENT_AMBULATORY_CARE_PROVIDER_SITE_OTHER): Payer: Medicare HMO

## 2022-07-28 VITALS — Ht 61.0 in | Wt 155.0 lb

## 2022-07-28 DIAGNOSIS — Z0000 Encounter for general adult medical examination without abnormal findings: Secondary | ICD-10-CM

## 2022-07-28 NOTE — Patient Instructions (Signed)
Nicole Wheeler , Thank you for taking time to come for your Medicare Wellness Visit. I appreciate your ongoing commitment to your health goals. Please review the following plan we discussed and let me know if I can assist you in the future.   These are the goals we discussed:  Goals      DIET - INCREASE WATER INTAKE     Exercise 150 minutes per week (moderate activity)     Chair exercises daily and walk 30 minutes daily as tolereated     Patient Stated     07/26/2021 AWV Goal: Keep All Scheduled Appointments  Over the next year, patient will attend all scheduled appointments with their PCP and any specialists that they see.         This is a list of the screening recommended for you and due dates:  Health Maintenance  Topic Date Due   COVID-19 Vaccine (5 - 2023-24 season) 11/11/2021   DEXA scan (bone density measurement)  05/29/2022   Flu Shot  10/12/2022   Mammogram  12/03/2022   Medicare Annual Wellness Visit  07/28/2023   DTaP/Tdap/Td vaccine (2 - Td or Tdap) 04/06/2031   Pneumonia Vaccine  Completed   Zoster (Shingles) Vaccine  Completed   HPV Vaccine  Aged Out    Advanced directives: Advance directive discussed with you today. I have provided a copy for you to complete at home and have notarized. Once this is complete please bring a copy in to our office so we can scan it into your chart.   Conditions/risks identified: Aim for 30 minutes of exercise or brisk walking, 6-8 glasses of water, and 5 servings of fruits and vegetables each day.   Next appointment: Follow up in one year for your annual wellness visit    Preventive Care 65 Years and Older, Female Preventive care refers to lifestyle choices and visits with your health care provider that can promote health and wellness. What does preventive care include? A yearly physical exam. This is also called an annual well check. Dental exams once or twice a year. Routine eye exams. Ask your health care provider how often you  should have your eyes checked. Personal lifestyle choices, including: Daily care of your teeth and gums. Regular physical activity. Eating a healthy diet. Avoiding tobacco and drug use. Limiting alcohol use. Practicing safe sex. Taking low-dose aspirin every day. Taking vitamin and mineral supplements as recommended by your health care provider. What happens during an annual well check? The services and screenings done by your health care provider during your annual well check will depend on your age, overall health, lifestyle risk factors, and family history of disease. Counseling  Your health care provider may ask you questions about your: Alcohol use. Tobacco use. Drug use. Emotional well-being. Home and relationship well-being. Sexual activity. Eating habits. History of falls. Memory and ability to understand (cognition). Work and work Astronomer. Reproductive health. Screening  You may have the following tests or measurements: Height, weight, and BMI. Blood pressure. Lipid and cholesterol levels. These may be checked every 5 years, or more frequently if you are over 46 years old. Skin check. Lung cancer screening. You may have this screening every year starting at age 51 if you have a 30-pack-year history of smoking and currently smoke or have quit within the past 15 years. Fecal occult blood test (FOBT) of the stool. You may have this test every year starting at age 93. Flexible sigmoidoscopy or colonoscopy. You may have a  sigmoidoscopy every 5 years or a colonoscopy every 10 years starting at age 20. Hepatitis C blood test. Hepatitis B blood test. Sexually transmitted disease (STD) testing. Diabetes screening. This is done by checking your blood sugar (glucose) after you have not eaten for a while (fasting). You may have this done every 1-3 years. Bone density scan. This is done to screen for osteoporosis. You may have this done starting at age 31. Mammogram. This may  be done every 1-2 years. Talk to your health care provider about how often you should have regular mammograms. Talk with your health care provider about your test results, treatment options, and if necessary, the need for more tests. Vaccines  Your health care provider may recommend certain vaccines, such as: Influenza vaccine. This is recommended every year. Tetanus, diphtheria, and acellular pertussis (Tdap, Td) vaccine. You may need a Td booster every 10 years. Zoster vaccine. You may need this after age 54. Pneumococcal 13-valent conjugate (PCV13) vaccine. One dose is recommended after age 50. Pneumococcal polysaccharide (PPSV23) vaccine. One dose is recommended after age 80. Talk to your health care provider about which screenings and vaccines you need and how often you need them. This information is not intended to replace advice given to you by your health care provider. Make sure you discuss any questions you have with your health care provider. Document Released: 03/26/2015 Document Revised: 11/17/2015 Document Reviewed: 12/29/2014 Elsevier Interactive Patient Education  2017 [employer].  Fall Prevention in the Home Falls can cause injuries. They can happen to people of all ages. There are many things you can do to make your home safe and to help prevent falls. What can I do on the outside of my home? Regularly fix the edges of walkways and driveways and fix any cracks. Remove anything that might make you trip as you walk through a door, such as a raised step or threshold. Trim any bushes or trees on the path to your home. Use bright outdoor lighting. Clear any walking paths of anything that might make someone trip, such as rocks or tools. Regularly check to see if handrails are loose or broken. Make sure that both sides of any steps have handrails. Any raised decks and porches should have guardrails on the edges. Have any leaves, snow, or ice cleared regularly. Use sand or salt  on walking paths during winter. Clean up any spills in your garage right away. This includes oil or grease spills. What can I do in the bathroom? Use night lights. Install grab bars by the toilet and in the tub and shower. Do not use towel bars as grab bars. Use non-skid mats or decals in the tub or shower. If you need to sit down in the shower, use a plastic, non-slip stool. Keep the floor dry. Clean up any water that spills on the floor as soon as it happens. Remove soap buildup in the tub or shower regularly. Attach bath mats securely with double-sided non-slip rug tape. Do not have throw rugs and other things on the floor that can make you trip. What can I do in the bedroom? Use night lights. Make sure that you have a light by your bed that is easy to reach. Do not use any sheets or blankets that are too big for your bed. They should not hang down onto the floor. Have a firm chair that has side arms. You can use this for support while you get dressed. Do not have throw rugs and other  things on the floor that can make you trip. What can I do in the kitchen? Clean up any spills right away. Avoid walking on wet floors. Keep items that you use a lot in easy-to-reach places. If you need to reach something above you, use a strong step stool that has a grab bar. Keep electrical cords out of the way. Do not use floor polish or wax that makes floors slippery. If you must use wax, use non-skid floor wax. Do not have throw rugs and other things on the floor that can make you trip. What can I do with my stairs? Do not leave any items on the stairs. Make sure that there are handrails on both sides of the stairs and use them. Fix handrails that are broken or loose. Make sure that handrails are as long as the stairways. Check any carpeting to make sure that it is firmly attached to the stairs. Fix any carpet that is loose or worn. Avoid having throw rugs at the top or bottom of the stairs. If you  do have throw rugs, attach them to the floor with carpet tape. Make sure that you have a light switch at the top of the stairs and the bottom of the stairs. If you do not have them, ask someone to add them for you. What else can I do to help prevent falls? Wear shoes that: Do not have high heels. Have rubber bottoms. Are comfortable and fit you well. Are closed at the toe. Do not wear sandals. If you use a stepladder: Make sure that it is fully opened. Do not climb a closed stepladder. Make sure that both sides of the stepladder are locked into place. Ask someone to hold it for you, if possible. Clearly mark and make sure that you can see: Any grab bars or handrails. First and last steps. Where the edge of each step is. Use tools that help you move around (mobility aids) if they are needed. These include: Canes. Walkers. Scooters. Crutches. Turn on the lights when you go into a dark area. Replace any light bulbs as soon as they burn out. Set up your furniture so you have a clear path. Avoid moving your furniture around. If any of your floors are uneven, fix them. If there are any pets around you, be aware of where they are. Review your medicines with your doctor. Some medicines can make you feel dizzy. This can increase your chance of falling. Ask your doctor what other things that you can do to help prevent falls. This information is not intended to replace advice given to you by your health care provider. Make sure you discuss any questions you have with your health care provider. Document Released: 12/24/2008 Document Revised: 08/05/2015 Document Reviewed: 04/03/2014 Elsevier Interactive Patient Education  2017 [employer].

## 2022-07-28 NOTE — Progress Notes (Signed)
Subjective:   Nicole Wheeler is a 89 y.o. female who presents for Medicare Annual (Subsequent) preventive examination. I connected with  Nicole Wheeler on 07/28/22 by a audio enabled telemedicine application and verified that I am speaking with the correct person using two identifiers.  Patient Location: Home  Provider Location: Home Office  I discussed the limitations of evaluation and management by telemedicine. The patient expressed understanding and agreed to proceed.  Review of Systems     Cardiac Risk Factors include: advanced age (>66men, >44 women);hypertension;dyslipidemia     Objective:    Today's Vitals   07/28/22 1457  Weight: 155 lb (70.3 kg)  Height: 5\' 1"  (1.549 m)   Body mass index is 29.29 kg/m.     07/26/2021    2:15 PM 07/23/2020    4:31 PM 06/30/2019    2:31 PM 09/20/2017   10:58 AM 08/17/2015   11:31 AM 09/17/2014   11:41 AM 02/09/2014    8:52 AM  Advanced Directives  Does Patient Have a Medical Advance Directive? Yes Yes Yes Yes Yes Yes Yes  Type of Estate agent of [place];Living will Healthcare Power of [place];Living will Living will;Healthcare Power of Attorney Living will;Healthcare Power of Attorney Living will;Healthcare Power of Attorney  Living will  Does patient want to make changes to medical advance directive?    No - Patient declined No - Patient declined    Copy of Healthcare Power of Attorney in Chart? No - copy requested No - copy requested No - copy requested No - copy requested No - copy requested      Current Medications (verified) Outpatient Encounter Medications as of 07/28/2022  Medication Sig   aspirin EC 81 MG tablet Take 81 mg by mouth daily.   brimonidine (ALPHAGAN) 0.2 % ophthalmic solution 1 drop 2 (two) times daily.   Calcium Carbonate-Vit D-Min (CALCIUM 1200 PO) Take by mouth.   Cholecalciferol (VITAMIN D) 2000 units CAPS Take 1,000 Units by mouth daily.   diphenhydramine-acetaminophen (TYLENOL PM)  25-500 MG TABS tablet Take 0.5 tablets by mouth at bedtime as needed.    dorzolamide (TRUSOPT) 2 % ophthalmic solution dorzolamide 2 % eye drops  INSTILL 1 DROP INTO AFFECTED EYE(S) BY OPHTHALMIC ROUTE 3 TIMES PER DAY   fish oil-omega-3 fatty acids 1000 MG capsule Take 1 capsule by mouth daily.   fluticasone (FLONASE) 50 MCG/ACT nasal spray Place 2 sprays into both nostrils daily.   hydrochlorothiazide (HYDRODIURIL) 25 MG tablet Take 1 tablet (25 mg total) by mouth daily.   levothyroxine (SYNTHROID) 100 MCG tablet Take 1 tablet (100 mcg total) by mouth daily.   loratadine (CLARITIN) 10 MG tablet Take 1 tablet (10 mg total) by mouth daily.   losartan (COZAAR) 100 MG tablet Take 1 tablet (100 mg total) by mouth daily.   Multiple Vitamins-Minerals (CENTRUM SILVER PO) Take by mouth.   Multiple Vitamins-Minerals (PRESERVISION/LUTEIN PO) Take by mouth 2 (two) times daily.   ROCKLATAN 0.02-0.005 % SOLN Apply 1 drop to eye at bedtime.   triamcinolone cream (KENALOG) 0.1 % Apply 1 application topically 2 (two) times daily as needed.   No facility-administered encounter medications on file as of 07/28/2022.    Allergies (verified) Ace inhibitors, Crestor [rosuvastatin calcium], and Lipitor [atorvastatin]   History: Past Medical History:  Diagnosis Date   Arthritis    Cataract    Glaucoma    Hyperlipidemia    Hypertension    Hypothyroidism    dr don Christell Constant  pcp   Macular degeneration    Osteopenia    SCC (squamous cell carcinoma) 09/30/2013   right post leg scc insitu cx3 35fu   Shingles    Spinal stenosis    Squamous cell carcinoma of skin 09/30/2013   right hand scc well diff cx3 42fu   Past Surgical History:  Procedure Laterality Date   APPENDECTOMY     EYE SURGERY Right    cataract removal   LUMBAR LAMINECTOMY/DECOMPRESSION MICRODISCECTOMY  12/07/2011   Procedure: LUMBAR LAMINECTOMY/DECOMPRESSION MICRODISCECTOMY 1 LEVEL;  Surgeon: Cristi Loron, MD;  Location: [facility];   Service: Neurosurgery;  Laterality: Right;  Right Lumbar Five-Sacral One Diskectomy   throidectomy     TUBAL LIGATION     Family History  Problem Relation Age of Onset   Stroke Mother 55   Hypertension Mother    Stroke Father 50   Hypertension Father    Heart attack Sister 31       heart attack   Breast cancer Maternal Aunt    Healthy Daughter    Healthy Son    Healthy Son    Social History   Socioeconomic History   Marital status: Widowed    Spouse name: Not on file   Number of children: 3   Years of education: 11   Highest education level: Some college, no degree  Occupational History   Occupation: Retired    Comment: Investment banker, corporate work  Tobacco Use   Smoking status: Former    Packs/day: 1.00    Years: 25.00    Additional pack years: 0.00    Total pack years: 25.00    Types: Cigarettes    Quit date: 03/13/1978    Years since quitting: 44.4   Smokeless tobacco: Never  Vaping Use   Vaping Use: Never used  Substance and Sexual Activity   Alcohol use: Yes    Comment: occasional glass of wine   Drug use: No   Sexual activity: Never  Other Topics Concern   Not on file  Social History Narrative   Nicole Wheeler is retired and lives alone. She is widowed and has 3 grown children. She enjoys sewing, quilting, and word puzzles. She is involved in her church.    Social Determinants of Health   Financial Resource Strain: Low Risk  (07/28/2022)   Overall Financial Resource Strain (CARDIA)    Difficulty of Paying Living Expenses: Not hard at all  Food Insecurity: No Food Insecurity (07/28/2022)   Hunger Vital Sign    Worried About Running Out of Food in the Last Year: Never true    Ran Out of Food in the Last Year: Never true  Transportation Needs: No Transportation Needs (07/28/2022)   PRAPARE - Administrator, Civil Service (Medical): No    Lack of Transportation (Non-Medical): No  Physical Activity: Insufficiently Active (07/28/2022)   Exercise Vital Sign    Days of  Exercise per Week: 3 days    Minutes of Exercise per Session: 30 min  Stress: No Stress Concern Present (07/28/2022)   [employer] of Occupational Health - Occupational Stress Questionnaire    Feeling of Stress : Not at all  Social Connections: Moderately Integrated (07/28/2022)   Social Connection and Isolation Panel [NHANES]    Frequency of Communication with Friends and Family: More than three times a week    Frequency of Social Gatherings with Friends and Family: More than three times a week    Attends Religious Services: More than 4  times per year    Active Member of Clubs or Organizations: No    Attends Banker Meetings: Never    Marital Status: Married    Tobacco Counseling Counseling given: Not Answered   Clinical Intake:  Pre-visit preparation completed: Yes  Pain : No/denies pain     Nutritional Risks: None Diabetes: No  How often do you need to have someone help you when you read instructions, pamphlets, or other written materials from your doctor or pharmacy?: 1 - Never  Diabetic?no   Interpreter Needed?: No  Information entered by :: Renie Ora, LPN   Activities of Daily Living    07/28/2022    3:01 PM  In your present state of health, do you have any difficulty performing the following activities:  Hearing? 0  Vision? 0  Difficulty concentrating or making decisions? 0  Walking or climbing stairs? 0  Dressing or bathing? 0  Doing errands, shopping? 0  Preparing Food and eating ? N  Using the Toilet? N  In the past six months, have you accidently leaked urine? N  Do you have problems with loss of bowel control? N  Managing your Medications? N  Managing your Finances? N  Housekeeping or managing your Housekeeping? N    Patient Care Team: Bennie Pierini, FNP as PCP - General (Nurse Practitioner) Tressie Stalker, MD as Consulting Physician (Neurosurgery) Ernesto Rutherford, MD as Consulting Physician  (Ophthalmology) Glyn Ade, PA-C as Physician Assistant (Dermatology)  Indicate any recent Medical Services you may have received from other than [facility] providers in the past year (date may be approximate).     Assessment:   This is a routine wellness examination for Terisa.  Hearing/Vision screen Vision Screening - Comments:: Wears rx glasses - up to date with routine eye exams with  Dr.Groat  Dietary issues and exercise activities discussed: Current Exercise Habits: Home exercise routine, Type of exercise: walking, Time (Minutes): 30, Frequency (Times/Week): 3, Weekly Exercise (Minutes/Week): 90, Intensity: Mild, Exercise limited by: None identified   Goals Addressed             This Visit's Progress    DIET - INCREASE WATER INTAKE         Depression Screen    07/28/2022    2:59 PM 04/06/2022    1:53 PM 10/03/2021   10:58 AM 07/26/2021    2:13 PM 07/05/2021   10:30 AM 10/08/2020    8:43 AM 10/01/2020   10:27 AM  PHQ 2/9 Scores  PHQ - 2 Score 0 0 0 2 3 0 0  PHQ- 9 Score  0 2 4 6   0    Fall Risk    07/28/2022    2:57 PM 04/06/2022    1:53 PM 10/03/2021   10:58 AM 07/26/2021    2:07 PM 07/05/2021   10:30 AM  Fall Risk   Falls in the past year? 0 0 0 0 0  Number falls in past yr: 0   0   Injury with Fall? 0   0   Risk for fall due to : No Fall Risks   Orthopedic patient   Follow up Falls prevention discussed   Falls prevention discussed     FALL RISK PREVENTION PERTAINING TO THE HOME:  Any stairs in or around the home? Yes  If so, are there any without handrails? No  Home free of loose throw rugs in walkways, pet beds, electrical cords, etc? Yes  Adequate lighting in your home to  reduce risk of falls? Yes   ASSISTIVE DEVICES UTILIZED TO PREVENT FALLS:  Life alert? No  Use of a cane, walker or w/c? No  Grab bars in the bathroom? Yes  Shower chair or bench in shower? Yes  Elevated toilet seat or a handicapped toilet? Yes       09/20/2017   10:58 AM  09/19/2016   11:35 AM 08/17/2015   11:43 AM 08/17/2015   11:40 AM  MMSE - Mini Mental State Exam  Orientation to time 5 5 5 5   Orientation to Place 5 5 5 5   Registration 3 3 3    Attention/ Calculation 5 5 5    Recall 3 3 2    Language- name 2 objects 2 2 2    Language- repeat 1 1 1    Language- follow 3 step command 3 3 3    Language- read & follow direction 1 1 1    Write a sentence 1 1 1    Copy design 1 1 1    Total score 30 30 29          07/28/2022    3:01 PM 07/26/2021    2:16 PM 06/30/2019    2:35 PM  6CIT Screen  What Year? 0 points 0 points 0 points  What month? 0 points 0 points 0 points  What time? 0 points 0 points 0 points  Count back from 20 0 points 0 points 0 points  Months in reverse 0 points 0 points 0 points  Repeat phrase 0 points 0 points 0 points  Total Score 0 points 0 points 0 points    Immunizations Immunization History  Administered Date(s) Administered   Fluad Quad(high Dose 65+) 12/21/2020, 12/14/2021   Influenza Split 12/08/2011, 12/14/2012   Influenza, High Dose Seasonal PF 01/08/2014   Influenza,inj,Quad PF,6+ Mos 12/11/2016   Influenza-Unspecified 01/13/2015, 01/01/2018, 12/16/2018, 01/21/2020   PFIZER(Purple Top)SARS-COV-2 Vaccination 04/04/2019, 04/25/2019, 01/10/2020, 07/23/2020   Pneumococcal Conjugate-13 06/15/2014   Pneumococcal Polysaccharide-23 03/13/1998   Tdap 04/05/2021   Zoster Recombinat (Shingrix) 10/01/2020, 04/05/2021    TDAP status: Up to date  Flu Vaccine status: Up to date  Pneumococcal vaccine status: Up to date  Covid-19 vaccine status: Completed vaccines  Qualifies for Shingles Vaccine? Yes   Zostavax completed Yes   Shingrix Completed?: Yes  Screening Tests Health Maintenance  Topic Date Due   COVID-19 Vaccine (5 - 2023-24 season) 11/11/2021   DEXA SCAN  05/29/2022   INFLUENZA VACCINE  10/12/2022   MAMMOGRAM  12/03/2022   Medicare Annual Wellness (AWV)  07/28/2023   DTaP/Tdap/Td (2 - Td or Tdap) 04/06/2031    Pneumonia Vaccine 60+ Years old  Completed   Zoster Vaccines- Shingrix  Completed   HPV VACCINES  Aged Out    Health Maintenance  Health Maintenance Due  Topic Date Due   COVID-19 Vaccine (5 - 2023-24 season) 11/11/2021   DEXA SCAN  05/29/2022    Colorectal cancer screening: No longer required.   Mammogram status: No longer required due to age .  Bone Density status: Ordered declined . Pt provided with contact info and advised to call to schedule appt.  Lung Cancer Screening: (Low Dose CT Chest recommended if Age 58-80 years, 30 pack-year currently smoking OR have quit w/in 15years.) does not qualify.   Lung Cancer Screening Referral: n/a  Additional Screening:  Hepatitis C Screening: does not qualify;   Vision Screening: Recommended annual ophthalmology exams for early detection of glaucoma and other disorders of the eye. Is the patient up to date  with their annual eye exam?  Yes  Who is the provider or what is the name of the office in which the patient attends annual eye exams? Dr.Groat  If pt is not established with a provider, would they like to be referred to a provider to establish care? No .   Dental Screening: Recommended annual dental exams for proper oral hygiene  Community Resource Referral / Chronic Care Management: CRR required this visit?  No   CCM required this visit?  No      Plan:     I have personally reviewed and noted the following in the patient's chart:   Medical and social history Use of alcohol, tobacco or illicit drugs  Current medications and supplements including opioid prescriptions. Patient is not currently taking opioid prescriptions. Functional ability and status Nutritional status Physical activity Advanced directives List of other physicians Hospitalizations, surgeries, and ER visits in previous 12 months Vitals Screenings to include cognitive, depression, and falls Referrals and appointments  In addition, I have reviewed  and discussed with patient certain preventive protocols, quality metrics, and best practice recommendations. A written personalized care plan for preventive services as well as general preventive health recommendations were provided to patient.     Lorrene Reid, LPN   06/19/8117   Nurse Notes: none

## 2022-08-15 DIAGNOSIS — H02831 Dermatochalasis of right upper eyelid: Secondary | ICD-10-CM | POA: Diagnosis not present

## 2022-08-15 DIAGNOSIS — H0102B Squamous blepharitis left eye, upper and lower eyelids: Secondary | ICD-10-CM | POA: Diagnosis not present

## 2022-08-15 DIAGNOSIS — H401132 Primary open-angle glaucoma, bilateral, moderate stage: Secondary | ICD-10-CM | POA: Diagnosis not present

## 2022-08-15 DIAGNOSIS — H0102A Squamous blepharitis right eye, upper and lower eyelids: Secondary | ICD-10-CM | POA: Diagnosis not present

## 2022-08-15 DIAGNOSIS — H04123 Dry eye syndrome of bilateral lacrimal glands: Secondary | ICD-10-CM | POA: Diagnosis not present

## 2022-08-15 DIAGNOSIS — Z961 Presence of intraocular lens: Secondary | ICD-10-CM | POA: Diagnosis not present

## 2022-08-15 DIAGNOSIS — H02834 Dermatochalasis of left upper eyelid: Secondary | ICD-10-CM | POA: Diagnosis not present

## 2022-08-15 DIAGNOSIS — H353132 Nonexudative age-related macular degeneration, bilateral, intermediate dry stage: Secondary | ICD-10-CM | POA: Diagnosis not present

## 2022-10-05 ENCOUNTER — Encounter: Payer: Self-pay | Admitting: Nurse Practitioner

## 2022-10-05 ENCOUNTER — Ambulatory Visit: Payer: Medicare HMO | Admitting: Nurse Practitioner

## 2022-10-05 VITALS — BP 144/76 | HR 65 | Temp 97.4°F | Ht 61.0 in | Wt 155.0 lb

## 2022-10-05 DIAGNOSIS — M8588 Other specified disorders of bone density and structure, other site: Secondary | ICD-10-CM

## 2022-10-05 DIAGNOSIS — S70262A Insect bite (nonvenomous), left hip, initial encounter: Secondary | ICD-10-CM

## 2022-10-05 DIAGNOSIS — I10 Essential (primary) hypertension: Secondary | ICD-10-CM | POA: Diagnosis not present

## 2022-10-05 DIAGNOSIS — E039 Hypothyroidism, unspecified: Secondary | ICD-10-CM | POA: Diagnosis not present

## 2022-10-05 DIAGNOSIS — E7800 Pure hypercholesterolemia, unspecified: Secondary | ICD-10-CM | POA: Diagnosis not present

## 2022-10-05 DIAGNOSIS — Z6829 Body mass index (BMI) 29.0-29.9, adult: Secondary | ICD-10-CM

## 2022-10-05 DIAGNOSIS — W57XXXA Bitten or stung by nonvenomous insect and other nonvenomous arthropods, initial encounter: Secondary | ICD-10-CM | POA: Diagnosis not present

## 2022-10-05 MED ORDER — LOSARTAN POTASSIUM 100 MG PO TABS: 100.0000 mg | ORAL_TABLET | Freq: Every day | ORAL | 1 refills | Status: DC

## 2022-10-05 MED ORDER — LEVOTHYROXINE SODIUM 100 MCG PO TABS: 100.0000 ug | ORAL_TABLET | Freq: Every day | ORAL | 1 refills | Status: DC

## 2022-10-05 MED ORDER — HYDROCHLOROTHIAZIDE 25 MG PO TABS: 25.0000 mg | ORAL_TABLET | Freq: Every day | ORAL | 1 refills | Status: DC

## 2022-10-05 NOTE — Progress Notes (Addendum)
Subjective:    Patient ID: Nicole Wheeler, female    DOB: 12/19/1930, 89 y.o.   MRN: [id number]   Chief Complaint: medical management of chronic issues     HPI:  Nicole Wheeler is a 89 y.o. who identifies as a female who was assigned female at birth.   Social history: Lives with: by herself- family checks on her daily Work history: retired   Water engineer in today for follow up of the following chronic medical issues:  1. Primary hypertension No c/o chest pain, sob or headache. Does not check blood pressure at home. BP Readings from Last 3 Encounters:  04/06/22 136/78  10/03/21 137/70  07/05/21 109/61     2. Pure hypercholesterolemia Does try to watch diet but does no exercise. Refuses statin therapy Lab Results  Component Value Date   CHOL 191 04/06/2022   HDL 43 04/06/2022   LDLCALC 125 (H) 04/06/2022   TRIG 130 04/06/2022   CHOLHDL 4.4 04/06/2022     3. Acquired hypothyroidism No issues that she is aware of. Lab Results  Component Value Date   TSH 1.110 04/06/2022     4. Osteopenia of lumbar spine Last dexascan as done on 05/28/20. No longer wants to do dexascans due to age  89. BMI 29.0-29.9,adult No recent weight changes. Wt Readings from Last 3 Encounters:  10/05/22 155 lb (70.3 kg)  07/28/22 155 lb (70.3 kg)  04/06/22 156 lb (70.8 kg)   BMI Readings from Last 3 Encounters:  10/05/22 29.29 kg/m  07/28/22 29.29 kg/m  04/06/22 30.47 kg/m       New complaints: Tick bite in may wants to make sure doe snot have lymes disease  Allergies  Allergen Reactions   Ace Inhibitors Cough   Crestor [Rosuvastatin Calcium]     Myalgia    Lipitor [Atorvastatin]     Myalgia    Outpatient Encounter Medications as of 10/05/2022  Medication Sig   aspirin EC 81 MG tablet Take 81 mg by mouth daily.   brimonidine (ALPHAGAN) 0.2 % ophthalmic solution 1 drop 2 (two) times daily.   Calcium Carbonate-Vit D-Min (CALCIUM 1200 PO) Take by mouth.   Cholecalciferol  (VITAMIN D) 2000 units CAPS Take 1,000 Units by mouth daily.   diphenhydramine-acetaminophen (TYLENOL PM) 25-500 MG TABS tablet Take 0.5 tablets by mouth at bedtime as needed.    dorzolamide (TRUSOPT) 2 % ophthalmic solution dorzolamide 2 % eye drops  INSTILL 1 DROP INTO AFFECTED EYE(S) BY OPHTHALMIC ROUTE 3 TIMES PER DAY   fish oil-omega-3 fatty acids 1000 MG capsule Take 1 capsule by mouth daily.   fluticasone (FLONASE) 50 MCG/ACT nasal spray Place 2 sprays into both nostrils daily.   hydrochlorothiazide (HYDRODIURIL) 25 MG tablet Take 1 tablet (25 mg total) by mouth daily.   levothyroxine (SYNTHROID) 100 MCG tablet Take 1 tablet (100 mcg total) by mouth daily.   loratadine (CLARITIN) 10 MG tablet Take 1 tablet (10 mg total) by mouth daily.   losartan (COZAAR) 100 MG tablet Take 1 tablet (100 mg total) by mouth daily.   Multiple Vitamins-Minerals (CENTRUM SILVER PO) Take by mouth.   Multiple Vitamins-Minerals (PRESERVISION/LUTEIN PO) Take by mouth 2 (two) times daily.   ROCKLATAN 0.02-0.005 % SOLN Apply 1 drop to eye at bedtime.   triamcinolone cream (KENALOG) 0.1 % Apply 1 application topically 2 (two) times daily as needed.   No facility-administered encounter medications on file as of 10/05/2022.    Past Surgical History:  Procedure Laterality Date  APPENDECTOMY     EYE SURGERY Right    cataract removal   LUMBAR LAMINECTOMY/DECOMPRESSION MICRODISCECTOMY  12/07/2011   Procedure: LUMBAR LAMINECTOMY/DECOMPRESSION MICRODISCECTOMY 1 LEVEL;  Surgeon: Cristi Loron, MD;  Location: [facility];  Service: Neurosurgery;  Laterality: Right;  Right Lumbar Five-Sacral One Diskectomy   throidectomy     TUBAL LIGATION      Family History  Problem Relation Age of Onset   Stroke Mother 50   Hypertension Mother    Stroke Father 36   Hypertension Father    Heart attack Sister 72       heart attack   Breast cancer Maternal Aunt    Healthy Daughter    Healthy Son    Healthy Son        Controlled substance contract: n/a     Review of Systems  Constitutional:  Negative for diaphoresis.  Eyes:  Negative for pain.  Respiratory:  Negative for shortness of breath.   Cardiovascular:  Negative for chest pain, palpitations and leg swelling.  Gastrointestinal:  Negative for abdominal pain.  Endocrine: Negative for polydipsia.  Skin:  Negative for rash.  Neurological:  Negative for dizziness, weakness and headaches.  Hematological:  Does not bruise/bleed easily.  All other systems reviewed and are negative.      Objective:   Physical Exam Vitals and nursing note reviewed.  Constitutional:      General: She is not in acute distress.    Appearance: Normal appearance. She is well-developed.  HENT:     Head: Normocephalic.     Right Ear: Tympanic membrane normal.     Left Ear: Tympanic membrane normal.     Nose: Nose normal.     Mouth/Throat:     Mouth: Mucous membranes are moist.  Eyes:     Pupils: Pupils are equal, round, and reactive to light.  Neck:     Vascular: No carotid bruit or JVD.  Cardiovascular:     Rate and Rhythm: Normal rate and regular rhythm.     Heart sounds: Normal heart sounds.  Pulmonary:     Effort: Pulmonary effort is normal. No respiratory distress.     Breath sounds: Normal breath sounds. No wheezing or rales.  Chest:     Chest wall: No tenderness.  Abdominal:     General: Bowel sounds are normal. There is no distension or abdominal bruit.     Palpations: Abdomen is soft. There is no hepatomegaly, splenomegaly, mass or pulsatile mass.     Tenderness: There is no abdominal tenderness.  Musculoskeletal:        General: Normal range of motion.     Cervical back: Normal range of motion and neck supple.  Lymphadenopathy:     Cervical: No cervical adenopathy.  Skin:    General: Skin is warm and dry.  Neurological:     Mental Status: She is alert and oriented to person, place, and time.     Deep Tendon Reflexes: Reflexes are  normal and symmetric.  Psychiatric:        Behavior: Behavior normal.        Thought Content: Thought content normal.        Judgment: Judgment normal.     BP (!) 144/76   Pulse 65   Temp (!) 97.4 F (36.3 C) (Temporal)   Ht 5\' 1"  (1.549 m)   Wt 155 lb (70.3 kg)   SpO2 99%   BMI 29.29 kg/m  Assessment & Plan:  Nicole Wheeler comes in today with chief complaint of Medical Management of Chronic Issues   Diagnosis and orders addressed:  1. Primary hypertension Low sodium diet - hydrochlorothiazide (HYDRODIURIL) 25 MG tablet; Take 1 tablet (25 mg total) by mouth daily.  Dispense: 90 tablet; Refill: 1 - losartan (COZAAR) 100 MG tablet; Take 1 tablet (100 mg total) by mouth daily.  Dispense: 90 tablet; Refill: 1 - CBC with Differential/Platelet - CMP14+EGFR  2. Pure hypercholesterolemia Low fat diet - Lipid panel  3. Acquired hypothyroidism Labs pending - levothyroxine (SYNTHROID) 100 MCG tablet; Take 1 tablet (100 mcg total) by mouth daily.  Dispense: 90 tablet; Refill: 1 - Thyroid Panel With TSH  4. Osteopenia of lumbar spine Weight bearing exercise  5. BMI 29.0-29.9,adult Discussed diet and exercise for person with BMI >25 Will recheck weight in 3-6 months  6. Tick bite left hip Lyme titer pending  Labs pending Health Maintenance reviewed Diet and exercise encouraged  Follow up plan: 6 months   Mary-Margaret Daphine Deutscher, FNP

## 2022-10-05 NOTE — Addendum Note (Signed)
Addended by: Bennie Pierini on: 10/05/2022 02:25 PM   Modules accepted: Orders

## 2022-10-05 NOTE — Patient Instructions (Signed)
Fall Prevention in the Home, Adult Falls can cause injuries and can happen to people of all ages. There are many things you can do to make your home safer and to help prevent falls. What actions can I take to prevent falls? General information Use good lighting in all rooms. Make sure to: Replace any light bulbs that burn out. Turn on the lights in dark areas and use night-lights. Keep items that you use often in easy-to-reach places. Lower the shelves around your home if needed. Move furniture so that there are clear paths around it. Do not use throw rugs or other things on the floor that can make you trip. If any of your floors are uneven, fix them. Add color or contrast paint or tape to clearly mark and help you see: Grab bars or handrails. First and last steps of staircases. Where the edge of each step is. If you use a ladder or stepladder: Make sure that it is fully opened. Do not climb a closed ladder. Make sure the sides of the ladder are locked in place. Have someone hold the ladder while you use it. Know where your pets are as you move through your home. What can I do in the bathroom?     Keep the floor dry. Clean up any water on the floor right away. Remove soap buildup in the bathtub or shower. Buildup makes bathtubs and showers slippery. Use non-skid mats or decals on the floor of the bathtub or shower. Attach bath mats securely with double-sided, non-slip rug tape. If you need to sit down in the shower, use a non-slip stool. Install grab bars by the toilet and in the bathtub and shower. Do not use towel bars as grab bars. What can I do in the bedroom? Make sure that you have a light by your bed that is easy to reach. Do not use any sheets or blankets on your bed that hang to the floor. Have a firm chair or bench with side arms that you can use for support when you get dressed. What can I do in the kitchen? Clean up any spills right away. If you need to reach something  above you, use a step stool with a grab bar. Keep electrical cords out of the way. Do not use floor polish or wax that makes floors slippery. What can I do with my stairs? Do not leave anything on the stairs. Make sure that you have a light switch at the top and the bottom of the stairs. Make sure that there are handrails on both sides of the stairs. Fix handrails that are broken or loose. Install non-slip stair treads on all your stairs if they do not have carpet. Avoid having throw rugs at the top or bottom of the stairs. Choose a carpet that does not hide the edge of the steps on the stairs. Make sure that the carpet is firmly attached to the stairs. Fix carpet that is loose or worn. What can I do on the outside of my home? Use bright outdoor lighting. Fix the edges of walkways and driveways and fix any cracks. Clear paths of anything that can make you trip, such as tools or rocks. Add color or contrast paint or tape to clearly mark and help you see anything that might make you trip as you walk through a door, such as a raised step or threshold. Trim any bushes or trees on paths to your home. Check to see if handrails are loose   or broken and that both sides of all steps have handrails. Install guardrails along the edges of any raised decks and porches. Have leaves, snow, or ice cleared regularly. Use sand, salt, or ice melter on paths if you live where there is ice and snow during the winter. Clean up any spills in your garage right away. This includes grease or oil spills. What other actions can I take? Review your medicines with your doctor. Some medicines can cause dizziness or changes in blood pressure, which increase your risk of falling. Wear shoes that: Have a low heel. Do not wear high heels. Have rubber bottoms and are closed at the toe. Feel good on your feet and fit well. Use tools that help you move around if needed. These include: Canes. Walkers. Scooters. Crutches. Ask  your doctor what else you can do to help prevent falls. This may include seeing a physical therapist to learn to do exercises to move better and get stronger. Where to find more information Centers for Disease Control and Prevention, [employer]: cdc.gov National Institute on Aging: nia.nih.gov National Institute on Aging: nia.nih.gov Contact a doctor if: You are afraid of falling at home. You feel weak, drowsy, or dizzy at home. You fall at home. Get help right away if you: Lose consciousness or have trouble moving after a fall. Have a fall that causes a head injury. These symptoms may be an emergency. Get help right away. Call 911. Do not wait to see if the symptoms will go away. Do not drive yourself to the hospital. This information is not intended to replace advice given to you by your health care provider. Make sure you discuss any questions you have with your health care provider. Document Revised: 10/31/2021 Document Reviewed: 10/31/2021 Elsevier Patient Education  2024 Elsevier Inc.  

## 2022-10-06 LAB — CBC WITH DIFFERENTIAL/PLATELET: RBC: 3.96 x10E6/uL (ref 3.77–5.28)

## 2022-10-06 LAB — LIPID PANEL: LDL Chol Calc (NIH): 128 mg/dL — ABNORMAL HIGH (ref 0–99)

## 2022-10-06 NOTE — Addendum Note (Signed)
Addended by: Bennie Pierini on: 10/06/2022 12:50 PM   Modules accepted: Level of Service

## 2022-10-30 DIAGNOSIS — M1712 Unilateral primary osteoarthritis, left knee: Secondary | ICD-10-CM | POA: Diagnosis not present

## 2023-01-08 ENCOUNTER — Other Ambulatory Visit: Payer: Self-pay | Admitting: Nurse Practitioner

## 2023-01-08 DIAGNOSIS — Z1231 Encounter for screening mammogram for malignant neoplasm of breast: Secondary | ICD-10-CM

## 2023-01-23 DIAGNOSIS — Z961 Presence of intraocular lens: Secondary | ICD-10-CM | POA: Diagnosis not present

## 2023-01-23 DIAGNOSIS — H04123 Dry eye syndrome of bilateral lacrimal glands: Secondary | ICD-10-CM | POA: Diagnosis not present

## 2023-01-23 DIAGNOSIS — H401132 Primary open-angle glaucoma, bilateral, moderate stage: Secondary | ICD-10-CM | POA: Diagnosis not present

## 2023-01-23 DIAGNOSIS — H353112 Nonexudative age-related macular degeneration, right eye, intermediate dry stage: Secondary | ICD-10-CM | POA: Diagnosis not present

## 2023-01-23 DIAGNOSIS — H0102A Squamous blepharitis right eye, upper and lower eyelids: Secondary | ICD-10-CM | POA: Diagnosis not present

## 2023-01-23 DIAGNOSIS — H0102B Squamous blepharitis left eye, upper and lower eyelids: Secondary | ICD-10-CM | POA: Diagnosis not present

## 2023-01-23 DIAGNOSIS — H02831 Dermatochalasis of right upper eyelid: Secondary | ICD-10-CM | POA: Diagnosis not present

## 2023-01-23 DIAGNOSIS — H02834 Dermatochalasis of left upper eyelid: Secondary | ICD-10-CM | POA: Diagnosis not present

## 2023-01-23 DIAGNOSIS — H353221 Exudative age-related macular degeneration, left eye, with active choroidal neovascularization: Secondary | ICD-10-CM | POA: Diagnosis not present

## 2023-01-30 DIAGNOSIS — H04123 Dry eye syndrome of bilateral lacrimal glands: Secondary | ICD-10-CM | POA: Diagnosis not present

## 2023-01-30 DIAGNOSIS — H353132 Nonexudative age-related macular degeneration, bilateral, intermediate dry stage: Secondary | ICD-10-CM | POA: Diagnosis not present

## 2023-01-30 DIAGNOSIS — H353211 Exudative age-related macular degeneration, right eye, with active choroidal neovascularization: Secondary | ICD-10-CM | POA: Diagnosis not present

## 2023-01-30 DIAGNOSIS — H353231 Exudative age-related macular degeneration, bilateral, with active choroidal neovascularization: Secondary | ICD-10-CM | POA: Diagnosis not present

## 2023-01-30 DIAGNOSIS — H02834 Dermatochalasis of left upper eyelid: Secondary | ICD-10-CM | POA: Diagnosis not present

## 2023-01-30 DIAGNOSIS — H02831 Dermatochalasis of right upper eyelid: Secondary | ICD-10-CM | POA: Diagnosis not present

## 2023-01-30 DIAGNOSIS — H353221 Exudative age-related macular degeneration, left eye, with active choroidal neovascularization: Secondary | ICD-10-CM | POA: Diagnosis not present

## 2023-02-05 ENCOUNTER — Ambulatory Visit
Admission: RE | Admit: 2023-02-05 | Discharge: 2023-02-05 | Disposition: A | Discharge status: Home / Self Care | Payer: Medicare HMO | Source: Ambulatory Visit | Attending: Nurse Practitioner | Admitting: Nurse Practitioner

## 2023-02-05 DIAGNOSIS — Z1231 Encounter for screening mammogram for malignant neoplasm of breast: Secondary | ICD-10-CM

## 2023-02-15 DIAGNOSIS — L03032 Cellulitis of left toe: Secondary | ICD-10-CM | POA: Diagnosis not present

## 2023-02-15 DIAGNOSIS — M79675 Pain in left toe(s): Secondary | ICD-10-CM | POA: Diagnosis not present

## 2023-02-27 DIAGNOSIS — L03031 Cellulitis of right toe: Secondary | ICD-10-CM | POA: Diagnosis not present

## 2023-02-27 DIAGNOSIS — M79676 Pain in unspecified toe(s): Secondary | ICD-10-CM | POA: Diagnosis not present

## 2023-03-12 DIAGNOSIS — H02831 Dermatochalasis of right upper eyelid: Secondary | ICD-10-CM | POA: Diagnosis not present

## 2023-03-12 DIAGNOSIS — H0102A Squamous blepharitis right eye, upper and lower eyelids: Secondary | ICD-10-CM | POA: Diagnosis not present

## 2023-03-12 DIAGNOSIS — H02834 Dermatochalasis of left upper eyelid: Secondary | ICD-10-CM | POA: Diagnosis not present

## 2023-03-12 DIAGNOSIS — H04123 Dry eye syndrome of bilateral lacrimal glands: Secondary | ICD-10-CM | POA: Diagnosis not present

## 2023-03-12 DIAGNOSIS — H353231 Exudative age-related macular degeneration, bilateral, with active choroidal neovascularization: Secondary | ICD-10-CM | POA: Diagnosis not present

## 2023-03-12 DIAGNOSIS — H0102B Squamous blepharitis left eye, upper and lower eyelids: Secondary | ICD-10-CM | POA: Diagnosis not present

## 2023-03-20 DIAGNOSIS — H353112 Nonexudative age-related macular degeneration, right eye, intermediate dry stage: Secondary | ICD-10-CM | POA: Diagnosis not present

## 2023-03-20 DIAGNOSIS — H401132 Primary open-angle glaucoma, bilateral, moderate stage: Secondary | ICD-10-CM | POA: Diagnosis not present

## 2023-03-20 DIAGNOSIS — H02834 Dermatochalasis of left upper eyelid: Secondary | ICD-10-CM | POA: Diagnosis not present

## 2023-03-20 DIAGNOSIS — H04123 Dry eye syndrome of bilateral lacrimal glands: Secondary | ICD-10-CM | POA: Diagnosis not present

## 2023-03-20 DIAGNOSIS — H353220 Exudative age-related macular degeneration, left eye, stage unspecified: Secondary | ICD-10-CM | POA: Diagnosis not present

## 2023-03-20 DIAGNOSIS — H0102B Squamous blepharitis left eye, upper and lower eyelids: Secondary | ICD-10-CM | POA: Diagnosis not present

## 2023-03-20 DIAGNOSIS — H02831 Dermatochalasis of right upper eyelid: Secondary | ICD-10-CM | POA: Diagnosis not present

## 2023-03-20 DIAGNOSIS — H0102A Squamous blepharitis right eye, upper and lower eyelids: Secondary | ICD-10-CM | POA: Diagnosis not present

## 2023-03-20 DIAGNOSIS — Z961 Presence of intraocular lens: Secondary | ICD-10-CM | POA: Diagnosis not present

## 2023-03-26 DIAGNOSIS — H401132 Primary open-angle glaucoma, bilateral, moderate stage: Secondary | ICD-10-CM | POA: Diagnosis not present

## 2023-03-26 DIAGNOSIS — H04123 Dry eye syndrome of bilateral lacrimal glands: Secondary | ICD-10-CM | POA: Diagnosis not present

## 2023-03-26 DIAGNOSIS — H0102A Squamous blepharitis right eye, upper and lower eyelids: Secondary | ICD-10-CM | POA: Diagnosis not present

## 2023-03-26 DIAGNOSIS — H02834 Dermatochalasis of left upper eyelid: Secondary | ICD-10-CM | POA: Diagnosis not present

## 2023-03-26 DIAGNOSIS — H0102B Squamous blepharitis left eye, upper and lower eyelids: Secondary | ICD-10-CM | POA: Diagnosis not present

## 2023-03-26 DIAGNOSIS — Z961 Presence of intraocular lens: Secondary | ICD-10-CM | POA: Diagnosis not present

## 2023-03-26 DIAGNOSIS — H353112 Nonexudative age-related macular degeneration, right eye, intermediate dry stage: Secondary | ICD-10-CM | POA: Diagnosis not present

## 2023-03-26 DIAGNOSIS — H353221 Exudative age-related macular degeneration, left eye, with active choroidal neovascularization: Secondary | ICD-10-CM | POA: Diagnosis not present

## 2023-03-26 DIAGNOSIS — H02831 Dermatochalasis of right upper eyelid: Secondary | ICD-10-CM | POA: Diagnosis not present

## 2023-04-03 ENCOUNTER — Other Ambulatory Visit: Payer: Self-pay | Admitting: Nurse Practitioner

## 2023-04-03 DIAGNOSIS — I10 Essential (primary) hypertension: Secondary | ICD-10-CM

## 2023-04-06 ENCOUNTER — Other Ambulatory Visit: Payer: Self-pay | Admitting: Nurse Practitioner

## 2023-04-06 DIAGNOSIS — E039 Hypothyroidism, unspecified: Secondary | ICD-10-CM

## 2023-04-09 ENCOUNTER — Encounter: Payer: Self-pay | Admitting: Nurse Practitioner

## 2023-04-09 ENCOUNTER — Ambulatory Visit (INDEPENDENT_AMBULATORY_CARE_PROVIDER_SITE_OTHER): Payer: Medicare HMO | Admitting: Nurse Practitioner

## 2023-04-09 VITALS — BP 158/80 | HR 68 | Temp 98.3°F | Ht 61.0 in | Wt 156.0 lb

## 2023-04-09 DIAGNOSIS — I10 Essential (primary) hypertension: Secondary | ICD-10-CM | POA: Diagnosis not present

## 2023-04-09 DIAGNOSIS — E039 Hypothyroidism, unspecified: Secondary | ICD-10-CM | POA: Diagnosis not present

## 2023-04-09 DIAGNOSIS — M8588 Other specified disorders of bone density and structure, other site: Secondary | ICD-10-CM | POA: Diagnosis not present

## 2023-04-09 DIAGNOSIS — Z6829 Body mass index (BMI) 29.0-29.9, adult: Secondary | ICD-10-CM | POA: Diagnosis not present

## 2023-04-09 DIAGNOSIS — E7800 Pure hypercholesterolemia, unspecified: Secondary | ICD-10-CM

## 2023-04-09 LAB — LIPID PANEL

## 2023-04-09 MED ORDER — HYDROCHLOROTHIAZIDE 25 MG PO TABS: 25.0000 mg | ORAL_TABLET | Freq: Every day | ORAL | 1 refills | Status: DC

## 2023-04-09 MED ORDER — LOSARTAN POTASSIUM 100 MG PO TABS: 100.0000 mg | ORAL_TABLET | Freq: Every day | ORAL | 1 refills | Status: DC

## 2023-04-09 NOTE — Patient Instructions (Signed)
Fall Prevention in the Home, Adult Falls can cause injuries and can happen to people of all ages. There are many things you can do to make your home safer and to help prevent falls. What actions can I take to prevent falls? General information Use good lighting in all rooms. Make sure to: Replace any light bulbs that burn out. Turn on the lights in dark areas and use night-lights. Keep items that you use often in easy-to-reach places. Lower the shelves around your home if needed. Move furniture so that there are clear paths around it. Do not use throw rugs or other things on the floor that can make you trip. If any of your floors are uneven, fix them. Add color or contrast paint or tape to clearly mark and help you see: Grab bars or handrails. First and last steps of staircases. Where the edge of each step is. If you use a ladder or stepladder: Make sure that it is fully opened. Do not climb a closed ladder. Make sure the sides of the ladder are locked in place. Have someone hold the ladder while you use it. Know where your pets are as you move through your home. What can I do in the bathroom?     Keep the floor dry. Clean up any water on the floor right away. Remove soap buildup in the bathtub or shower. Buildup makes bathtubs and showers slippery. Use non-skid mats or decals on the floor of the bathtub or shower. Attach bath mats securely with double-sided, non-slip rug tape. If you need to sit down in the shower, use a non-slip stool. Install grab bars by the toilet and in the bathtub and shower. Do not use towel bars as grab bars. What can I do in the bedroom? Make sure that you have a light by your bed that is easy to reach. Do not use any sheets or blankets on your bed that hang to the floor. Have a firm chair or bench with side arms that you can use for support when you get dressed. What can I do in the kitchen? Clean up any spills right away. If you need to reach something  above you, use a step stool with a grab bar. Keep electrical cords out of the way. Do not use floor polish or wax that makes floors slippery. What can I do with my stairs? Do not leave anything on the stairs. Make sure that you have a light switch at the top and the bottom of the stairs. Make sure that there are handrails on both sides of the stairs. Fix handrails that are broken or loose. Install non-slip stair treads on all your stairs if they do not have carpet. Avoid having throw rugs at the top or bottom of the stairs. Choose a carpet that does not hide the edge of the steps on the stairs. Make sure that the carpet is firmly attached to the stairs. Fix carpet that is loose or worn. What can I do on the outside of my home? Use bright outdoor lighting. Fix the edges of walkways and driveways and fix any cracks. Clear paths of anything that can make you trip, such as tools or rocks. Add color or contrast paint or tape to clearly mark and help you see anything that might make you trip as you walk through a door, such as a raised step or threshold. Trim any bushes or trees on paths to your home. Check to see if handrails are loose  or broken and that both sides of all steps have handrails. Install guardrails along the edges of any raised decks and porches. Have leaves, snow, or ice cleared regularly. Use sand, salt, or ice melter on paths if you live where there is ice and snow during the winter. Clean up any spills in your garage right away. This includes grease or oil spills. What other actions can I take? Review your medicines with your doctor. Some medicines can cause dizziness or changes in blood pressure, which increase your risk of falling. Wear shoes that: Have a low heel. Do not wear high heels. Have rubber bottoms and are closed at the toe. Feel good on your feet and fit well. Use tools that help you move around if needed. These include: Canes. Walkers. Scooters. Crutches. Ask  your doctor what else you can do to help prevent falls. This may include seeing a physical therapist to learn to do exercises to move better and get stronger. Where to find more information Centers for Disease Control and Prevention, STEADI: TonerPromos.no [employer] on Aging: BaseRingTones.pl National Institute on Aging: BaseRingTones.pl Contact a doctor if: You are afraid of falling at home. You feel weak, drowsy, or dizzy at home. You fall at home. Get help right away if you: Lose consciousness or have trouble moving after a fall. Have a fall that causes a head injury. These symptoms may be an emergency. Get help right away. Call 911. Do not wait to see if the symptoms will go away. Do not drive yourself to the hospital. This information is not intended to replace advice given to you by your health care provider. Make sure you discuss any questions you have with your health care provider. Document Revised: 10/31/2021 Document Reviewed: 10/31/2021 Elsevier Patient Education  2024 [employer].

## 2023-04-09 NOTE — Addendum Note (Signed)
Addended by: Bennie Pierini on: 04/09/2023 02:57 PM   Modules accepted: Orders

## 2023-04-09 NOTE — Progress Notes (Signed)
Subjective:    Patient ID: Nicole Wheeler, female    DOB: 1930-10-26, 89 y.o.   MRN: [id number]   Chief Complaint: medical management of chronic issues     HPI:  Nicole Wheeler is a 89 y.o. who identifies as a female who was assigned female at birth.   Social history: Lives with: by herself- family checks on her daily Work history: retired   Water engineer in today for follow up of the following chronic medical issues:  1. Primary hypertension No c/o chest pain, sob or headache. Does not check blood pressure at home. BP Readings from Last 3 Encounters:  10/05/22 (!) 144/76  04/06/22 136/78  10/03/21 137/70     2. Pure hypercholesterolemia Does try to watch diet but does no exercise. Refuses statin therapy Lab Results  Component Value Date   CHOL 197 10/05/2022   HDL 47 10/05/2022   LDLCALC 128 (H) 10/05/2022   TRIG 121 10/05/2022   CHOLHDL 4.2 10/05/2022     3. Acquired hypothyroidism No issues that she is aware of. Lab Results  Component Value Date   TSH 1.330 10/05/2022     4. Osteopenia of lumbar spine Last dexascan as done on 05/28/20. No longer wants to do dexascans due to age  4. BMI 29.0-29.9,adult No recent weight changes.  Wt Readings from Last 3 Encounters:  04/09/23 156 lb (70.8 kg)  10/05/22 155 lb (70.3 kg)  07/28/22 155 lb (70.3 kg)   BMI Readings from Last 3 Encounters:  04/09/23 29.48 kg/m  10/05/22 29.29 kg/m  07/28/22 29.29 kg/m        New complaints: none  Allergies  Allergen Reactions   Ace Inhibitors Cough   Crestor [Rosuvastatin Calcium]     Myalgia    Lipitor [Atorvastatin]     Myalgia    Outpatient Encounter Medications as of 04/09/2023  Medication Sig   aspirin EC 81 MG tablet Take 81 mg by mouth daily.   brimonidine (ALPHAGAN) 0.2 % ophthalmic solution 1 drop 2 (two) times daily.   Calcium Carbonate-Vit D-Min (CALCIUM 1200 PO) Take by mouth.   Cholecalciferol (VITAMIN D) 2000 units CAPS Take 1,000 Units by  mouth daily.   diphenhydramine-acetaminophen (TYLENOL PM) 25-500 MG TABS tablet Take 0.5 tablets by mouth at bedtime as needed.    dorzolamide (TRUSOPT) 2 % ophthalmic solution dorzolamide 2 % eye drops  INSTILL 1 DROP INTO AFFECTED EYE(S) BY OPHTHALMIC ROUTE 3 TIMES PER DAY   fish oil-omega-3 fatty acids 1000 MG capsule Take 1 capsule by mouth daily.   fluticasone (FLONASE) 50 MCG/ACT nasal spray Place 2 sprays into both nostrils daily.   hydrochlorothiazide (HYDRODIURIL) 25 MG tablet Take 1 tablet (25 mg total) by mouth daily.   levothyroxine (SYNTHROID) 100 MCG tablet TAKE ONE TABLET DAILY   loratadine (CLARITIN) 10 MG tablet Take 1 tablet (10 mg total) by mouth daily.   losartan (COZAAR) 100 MG tablet TAKE ONE TABLET DAILY   Multiple Vitamins-Minerals (CENTRUM SILVER PO) Take by mouth.   Multiple Vitamins-Minerals (PRESERVISION/LUTEIN PO) Take by mouth 2 (two) times daily.   ROCKLATAN 0.02-0.005 % SOLN Apply 1 drop to eye at bedtime.   triamcinolone cream (KENALOG) 0.1 % Apply 1 application topically 2 (two) times daily as needed.   No facility-administered encounter medications on file as of 04/09/2023.    Past Surgical History:  Procedure Laterality Date   APPENDECTOMY     EYE SURGERY Right    cataract removal   LUMBAR LAMINECTOMY/DECOMPRESSION  MICRODISCECTOMY  12/07/2011   Procedure: LUMBAR LAMINECTOMY/DECOMPRESSION MICRODISCECTOMY 1 LEVEL;  Surgeon: Cristi Loron, MD;  Location: [facility];  Service: Neurosurgery;  Laterality: Right;  Right Lumbar Five-Sacral One Diskectomy   throidectomy     TUBAL LIGATION      Family History  Problem Relation Age of Onset   Stroke Mother 14   Hypertension Mother    Stroke Father 3   Hypertension Father    Heart attack Sister 25       heart attack   Healthy Daughter    Breast cancer Maternal Aunt 55 - 54   Healthy Son    Healthy Son       Controlled substance contract: n/a     Review of Systems  Constitutional:   Negative for diaphoresis.  Eyes:  Negative for pain.  Respiratory:  Negative for shortness of breath.   Cardiovascular:  Negative for chest pain, palpitations and leg swelling.  Gastrointestinal:  Negative for abdominal pain.  Endocrine: Negative for polydipsia.  Skin:  Negative for rash.  Neurological:  Negative for dizziness, weakness and headaches.  Hematological:  Does not bruise/bleed easily.  All other systems reviewed and are negative.      Objective:   Physical Exam Vitals and nursing note reviewed.  Constitutional:      General: She is not in acute distress.    Appearance: Normal appearance. She is well-developed.  HENT:     Head: Normocephalic.     Right Ear: Tympanic membrane normal.     Left Ear: Tympanic membrane normal.     Nose: Nose normal.     Mouth/Throat:     Mouth: Mucous membranes are moist.  Eyes:     Pupils: Pupils are equal, round, and reactive to light.  Neck:     Vascular: No carotid bruit or JVD.  Cardiovascular:     Rate and Rhythm: Normal rate and regular rhythm.     Heart sounds: Normal heart sounds.  Pulmonary:     Effort: Pulmonary effort is normal. No respiratory distress.     Breath sounds: Normal breath sounds. No wheezing or rales.  Chest:     Chest wall: No tenderness.  Abdominal:     General: Bowel sounds are normal. There is no distension or abdominal bruit.     Palpations: Abdomen is soft. There is no hepatomegaly, splenomegaly, mass or pulsatile mass.     Tenderness: There is no abdominal tenderness.  Musculoskeletal:        General: Normal range of motion.     Cervical back: Normal range of motion and neck supple.  Lymphadenopathy:     Cervical: No cervical adenopathy.  Skin:    General: Skin is warm and dry.  Neurological:     Mental Status: She is alert and oriented to person, place, and time.     Deep Tendon Reflexes: Reflexes are normal and symmetric.  Psychiatric:        Behavior: Behavior normal.        Thought  Content: Thought content normal.        Judgment: Judgment normal.     There were no vitals taken for this visit.        Assessment & Plan:  Nicole Wheeler comes in today with chief complaint of No chief complaint on file.   Diagnosis and orders addressed:  1. Primary hypertension Low sodium diet - hydrochlorothiazide (HYDRODIURIL) 25 MG tablet; Take 1 tablet (25 mg total) by mouth  daily.  Dispense: 90 tablet; Refill: 1 - losartan (COZAAR) 100 MG tablet; Take 1 tablet (100 mg total) by mouth daily.  Dispense: 90 tablet; Refill: 1 - CBC with Differential/Platelet - CMP14+EGFR  2. Pure hypercholesterolemia Low fat diet - Lipid panel  3. Acquired hypothyroidism Labs pending - levothyroxine (SYNTHROID) 100 MCG tablet; Take 1 tablet (100 mcg total) by mouth daily.  Dispense: 90 tablet; Refill: 1 - Thyroid Panel With TSH  4. Osteopenia of lumbar spine Weight bearing exercise  5. BMI 29.0-29.9,adult Discussed diet and exercise for person with BMI >25 Will recheck weight in 3-6 months  Labs pending Health Maintenance reviewed Diet and exercise encouraged  Follow up plan: 6 months   Mary-Margaret Daphine Deutscher, FNP

## 2023-04-10 LAB — CBC WITH DIFFERENTIAL/PLATELET
Basophils Absolute: 0.1 10*3/uL (ref 0.0–0.2)
Basos: 1 %
EOS (ABSOLUTE): 0.3 10*3/uL (ref 0.0–0.4)
Eos: 4 %
Hematocrit: 36.8 % (ref 34.0–46.6)
Hemoglobin: 12 g/dL (ref 11.1–15.9)
Immature Grans (Abs): 0 10*3/uL (ref 0.0–0.1)
Immature Granulocytes: 0 %
Lymphocytes Absolute: 1.5 10*3/uL (ref 0.7–3.1)
Lymphs: 18 %
MCH: 29.9 pg (ref 26.6–33.0)
MCHC: 32.6 g/dL (ref 31.5–35.7)
MCV: 92 fL (ref 79–97)
Monocytes Absolute: 0.7 10*3/uL (ref 0.1–0.9)
Monocytes: 8 %
Neutrophils Absolute: 5.7 10*3/uL (ref 1.4–7.0)
Neutrophils: 69 %
Platelets: 281 10*3/uL (ref 150–450)
RBC: 4.02 x10E6/uL (ref 3.77–5.28)
RDW: 12.1 % (ref 11.7–15.4)
WBC: 8.4 10*3/uL (ref 3.4–10.8)

## 2023-04-10 LAB — CMP14+EGFR
ALT: 19 IU/L (ref 0–32)
AST: 23 IU/L (ref 0–40)
Albumin: 4.2 g/dL (ref 3.6–4.6)
Alkaline Phosphatase: 114 IU/L (ref 44–121)
BUN/Creatinine Ratio: 28 (ref 12–28)
BUN: 38 mg/dL — ABNORMAL HIGH (ref 10–36)
CO2: 24 mmol/L (ref 20–29)
Calcium: 9.8 mg/dL (ref 8.7–10.3)
Chloride: 102 mmol/L (ref 96–106)
Creatinine, Ser: 1.34 mg/dL — ABNORMAL HIGH (ref 0.57–1.00)
Globulin, Total: 2.5 g/dL (ref 1.5–4.5)
Glucose: 100 mg/dL — ABNORMAL HIGH (ref 70–99)
Potassium: 4.4 mmol/L (ref 3.5–5.2)
Sodium: 142 mmol/L (ref 134–144)
Total Protein: 6.7 g/dL (ref 6.0–8.5)
eGFR: 38 mL/min/{1.73_m2} — ABNORMAL LOW (ref >59)

## 2023-04-10 LAB — THYROID PANEL WITH TSH
Free Thyroxine Index: 3 (ref 1.2–4.9)
T3 Uptake Ratio: 32 % (ref 24–39)
T4, Total: 9.3 ug/dL (ref 4.5–12.0)
TSH: 0.855 u[IU]/mL (ref 0.450–4.500)

## 2023-04-10 LAB — LIPID PANEL
Cholesterol, Total: 201 mg/dL — ABNORMAL HIGH (ref 100–199)
HDL: 48 mg/dL (ref >39)
LDL CALC COMMENT:: 4.2 ratio (ref 0.0–4.4)
LDL Chol Calc (NIH): 125 mg/dL — ABNORMAL HIGH (ref 0–99)
Triglycerides: 160 mg/dL — ABNORMAL HIGH (ref 0–149)
VLDL Cholesterol Cal: 28 mg/dL (ref 5–40)

## 2023-04-23 DIAGNOSIS — H353132 Nonexudative age-related macular degeneration, bilateral, intermediate dry stage: Secondary | ICD-10-CM | POA: Diagnosis not present

## 2023-04-23 DIAGNOSIS — H401133 Primary open-angle glaucoma, bilateral, severe stage: Secondary | ICD-10-CM | POA: Diagnosis not present

## 2023-04-23 DIAGNOSIS — H02834 Dermatochalasis of left upper eyelid: Secondary | ICD-10-CM | POA: Diagnosis not present

## 2023-04-23 DIAGNOSIS — H0102A Squamous blepharitis right eye, upper and lower eyelids: Secondary | ICD-10-CM | POA: Diagnosis not present

## 2023-04-23 DIAGNOSIS — H04123 Dry eye syndrome of bilateral lacrimal glands: Secondary | ICD-10-CM | POA: Diagnosis not present

## 2023-04-23 DIAGNOSIS — H0102B Squamous blepharitis left eye, upper and lower eyelids: Secondary | ICD-10-CM | POA: Diagnosis not present

## 2023-04-23 DIAGNOSIS — H353231 Exudative age-related macular degeneration, bilateral, with active choroidal neovascularization: Secondary | ICD-10-CM | POA: Diagnosis not present

## 2023-04-23 DIAGNOSIS — H02831 Dermatochalasis of right upper eyelid: Secondary | ICD-10-CM | POA: Diagnosis not present

## 2023-05-16 ENCOUNTER — Other Ambulatory Visit: Payer: Self-pay | Admitting: Nurse Practitioner

## 2023-05-16 DIAGNOSIS — J302 Other seasonal allergic rhinitis: Secondary | ICD-10-CM

## 2023-05-23 DIAGNOSIS — H401132 Primary open-angle glaucoma, bilateral, moderate stage: Secondary | ICD-10-CM | POA: Diagnosis not present

## 2023-05-23 DIAGNOSIS — H02831 Dermatochalasis of right upper eyelid: Secondary | ICD-10-CM | POA: Diagnosis not present

## 2023-05-23 DIAGNOSIS — H04123 Dry eye syndrome of bilateral lacrimal glands: Secondary | ICD-10-CM | POA: Diagnosis not present

## 2023-05-23 DIAGNOSIS — Z961 Presence of intraocular lens: Secondary | ICD-10-CM | POA: Diagnosis not present

## 2023-05-23 DIAGNOSIS — H02834 Dermatochalasis of left upper eyelid: Secondary | ICD-10-CM | POA: Diagnosis not present

## 2023-05-28 DIAGNOSIS — H02834 Dermatochalasis of left upper eyelid: Secondary | ICD-10-CM | POA: Diagnosis not present

## 2023-05-28 DIAGNOSIS — H0102B Squamous blepharitis left eye, upper and lower eyelids: Secondary | ICD-10-CM | POA: Diagnosis not present

## 2023-05-28 DIAGNOSIS — H02831 Dermatochalasis of right upper eyelid: Secondary | ICD-10-CM | POA: Diagnosis not present

## 2023-05-28 DIAGNOSIS — H353231 Exudative age-related macular degeneration, bilateral, with active choroidal neovascularization: Secondary | ICD-10-CM | POA: Diagnosis not present

## 2023-05-28 DIAGNOSIS — H401133 Primary open-angle glaucoma, bilateral, severe stage: Secondary | ICD-10-CM | POA: Diagnosis not present

## 2023-05-28 DIAGNOSIS — H353132 Nonexudative age-related macular degeneration, bilateral, intermediate dry stage: Secondary | ICD-10-CM | POA: Diagnosis not present

## 2023-05-28 DIAGNOSIS — H0102A Squamous blepharitis right eye, upper and lower eyelids: Secondary | ICD-10-CM | POA: Diagnosis not present

## 2023-05-28 DIAGNOSIS — H04123 Dry eye syndrome of bilateral lacrimal glands: Secondary | ICD-10-CM | POA: Diagnosis not present

## 2023-07-09 DIAGNOSIS — H02831 Dermatochalasis of right upper eyelid: Secondary | ICD-10-CM | POA: Diagnosis not present

## 2023-07-09 DIAGNOSIS — H353132 Nonexudative age-related macular degeneration, bilateral, intermediate dry stage: Secondary | ICD-10-CM | POA: Diagnosis not present

## 2023-07-09 DIAGNOSIS — H353231 Exudative age-related macular degeneration, bilateral, with active choroidal neovascularization: Secondary | ICD-10-CM | POA: Diagnosis not present

## 2023-07-09 DIAGNOSIS — H401133 Primary open-angle glaucoma, bilateral, severe stage: Secondary | ICD-10-CM | POA: Diagnosis not present

## 2023-07-09 DIAGNOSIS — H0102B Squamous blepharitis left eye, upper and lower eyelids: Secondary | ICD-10-CM | POA: Diagnosis not present

## 2023-07-09 DIAGNOSIS — H04123 Dry eye syndrome of bilateral lacrimal glands: Secondary | ICD-10-CM | POA: Diagnosis not present

## 2023-07-09 DIAGNOSIS — H0102A Squamous blepharitis right eye, upper and lower eyelids: Secondary | ICD-10-CM | POA: Diagnosis not present

## 2023-07-09 DIAGNOSIS — H02834 Dermatochalasis of left upper eyelid: Secondary | ICD-10-CM | POA: Diagnosis not present

## 2023-07-31 ENCOUNTER — Ambulatory Visit (INDEPENDENT_AMBULATORY_CARE_PROVIDER_SITE_OTHER): Payer: Medicare HMO

## 2023-07-31 VITALS — BP 158/80 | HR 68 | Ht 61.0 in | Wt 156.0 lb

## 2023-07-31 DIAGNOSIS — Z0000 Encounter for general adult medical examination without abnormal findings: Secondary | ICD-10-CM | POA: Diagnosis not present

## 2023-07-31 NOTE — Patient Instructions (Signed)
 Nicole Wheeler , Thank you for taking time out of your busy schedule to complete your Annual Wellness Visit with me. I enjoyed our conversation and look forward to speaking with you again next year. I, as well as your care team,  appreciate your ongoing commitment to your health goals. Please review the following plan we discussed and let me know if I can assist you in the future. Your Game plan/ To Do List   Follow up Visits: Next Medicare AWV with our clinical staff: Thursday, 07/31/24 at 10:40   Next Office Visit with your provider: 10/09/23 at 2:45p.m.  Clinician Recommendations:  Aim for 30 minutes of exercise or brisk walking, 6-8 glasses of water, and 5 servings of fruits and vegetables each day. N/a      This is a list of the screening recommended for you and due dates:  Health Maintenance  Topic Date Due   DEXA scan (bone density measurement)  05/29/2022   COVID-19 Vaccine (5 - 2024-25 season) 11/12/2022   Flu Shot  10/12/2023   Mammogram  02/05/2024   Medicare Annual Wellness Visit  07/30/2024   DTaP/Tdap/Td vaccine (2 - Td or Tdap) 04/06/2031   Pneumonia Vaccine  Completed   Zoster (Shingles) Vaccine  Completed   HPV Vaccine  Aged Out   Meningitis B Vaccine  Aged Out    Advanced directives: (Copy Requested) Please bring a copy of your health care power of attorney and living will to the office to be added to your chart at your convenience. You can mail to [facility] [address] [place], [place] [address] or email to [email or web address] Advance Care Planning is important because it:  [x]  Makes sure you receive the medical care that is consistent with your values, goals, and preferences  [x]  It provides guidance to your family and loved ones and reduces their decisional burden about whether or not they are making the right decisions based on your wishes.  Follow the link provided in your after visit summary or read over the paperwork we have mailed to  you to help you started getting your Advance Directives in place. If you need assistance in completing these, please reach out to us  so that we can help you!  See attachments for Preventive Care and Fall Prevention Tips.

## 2023-07-31 NOTE — Progress Notes (Signed)
 Subjective:   Nicole Wheeler is a 89 y.o. who presents for a Medicare Wellness preventive visit.  As a reminder, Annual Wellness Visits don't include a physical exam, and some assessments may be limited, especially if this visit is performed virtually. We may recommend an in-person follow-up visit with your provider if needed.  Visit Complete: Virtual I connected with  Nicole Wheeler on 07/31/23 by a audio enabled telemedicine application and verified that I am speaking with the correct person using two identifiers.  Patient Location: Home  Provider Location: Home Office  I discussed the limitations of evaluation and management by telemedicine. The patient expressed understanding and agreed to proceed.  Vital Signs: Because this visit was a virtual/telehealth visit, some criteria may be missing or patient reported. Any vitals not documented were not able to be obtained and vitals that have been documented are patient reported.  VideoDeclined- This patient declined Librarian, academic. Therefore the visit was completed with audio only.  Persons Participating in Visit: Patient.  AWV Questionnaire: No: Patient Medicare AWV questionnaire was not completed prior to this visit.  Cardiac Risk Factors include: advanced age (>53men, >47 women);hypertension;dyslipidemia     Objective:     Today's Vitals   07/31/23 1116 07/31/23 1119  BP: (!) 158/80   Pulse: 68   Weight: 156 lb (70.8 kg)   Height: 5\' 1"  (1.549 m)   PainSc:  3    Body mass index is 29.48 kg/m.     07/31/2023   11:25 AM 07/26/2021    2:15 PM 07/23/2020    4:31 PM 06/30/2019    2:31 PM 09/20/2017   10:58 AM 08/17/2015   11:31 AM 09/17/2014   11:41 AM  Advanced Directives  Does Patient Have a Medical Advance Directive? Yes Yes Yes Yes Yes Yes Yes  Type of Estate agent of [place];Living will Healthcare Power of [place];Living will Healthcare Power of [place];Living will  Living will;Healthcare Power of Attorney Living will;Healthcare Power of Attorney Living will;Healthcare Power of Attorney   Does patient want to make changes to medical advance directive?     No - Patient declined No - Patient declined   Copy of Healthcare Power of Attorney in Chart? No - copy requested No - copy requested No - copy requested No - copy requested No - copy requested No - copy requested     Current Medications (verified) Outpatient Encounter Medications as of 07/31/2023  Medication Sig   aspirin EC 81 MG tablet Take 81 mg by mouth daily.   Calcium  Carbonate-Vit D-Min (CALCIUM  1200 PO) Take by mouth.   Cholecalciferol (VITAMIN D) 2000 units CAPS Take 1,000 Units by mouth daily.   diphenhydramine-acetaminophen  (TYLENOL  PM) 25-500 MG TABS tablet Take 0.5 tablets by mouth at bedtime as needed.    dorzolamide (TRUSOPT) 2 % ophthalmic solution dorzolamide 2 % eye drops  INSTILL 1 DROP INTO AFFECTED EYE(S) BY OPHTHALMIC ROUTE 3 TIMES PER DAY   fish oil-omega-3 fatty acids 1000 MG capsule Take 1 capsule by mouth daily.   fluticasone  (FLONASE ) 50 MCG/ACT nasal spray Place 2 sprays into both nostrils daily.   hydrochlorothiazide  (HYDRODIURIL ) 25 MG tablet Take 1 tablet (25 mg total) by mouth daily.   levothyroxine  (SYNTHROID ) 100 MCG tablet TAKE ONE TABLET DAILY   loratadine  (CLARITIN ) 10 MG tablet TAKE ONE TABLET BY MOUTH DAILY   losartan  (COZAAR ) 100 MG tablet Take 1 tablet (100 mg total) by mouth daily.   Multiple Vitamins-Minerals (CENTRUM  SILVER PO) Take by mouth.   Multiple Vitamins-Minerals (PRESERVISION/LUTEIN PO) Take by mouth 2 (two) times daily.   ROCKLATAN 0.02-0.005 % SOLN Apply 1 drop to eye at bedtime.   triamcinolone  cream (KENALOG ) 0.1 % Apply 1 application topically 2 (two) times daily as needed.   No facility-administered encounter medications on file as of 07/31/2023.    Allergies (verified) Ace inhibitors, Crestor  [rosuvastatin  calcium ], and Lipitor  [atorvastatin ]   History: Past Medical History:  Diagnosis Date   Arthritis    Cataract    Glaucoma    Hyperlipidemia    Hypertension    Hypothyroidism    dr don [facility]   pcp   Macular degeneration    Osteopenia    SCC (squamous cell carcinoma) 09/30/2013   right post leg scc insitu cx3 58fu   Shingles    Spinal stenosis    Squamous cell carcinoma of skin 09/30/2013   right hand scc well diff cx3 65fu   Past Surgical History:  Procedure Laterality Date   APPENDECTOMY     EYE SURGERY Right    cataract removal   LUMBAR LAMINECTOMY/DECOMPRESSION MICRODISCECTOMY  12/07/2011   Procedure: LUMBAR LAMINECTOMY/DECOMPRESSION MICRODISCECTOMY 1 LEVEL;  Surgeon: Elder Greening, MD;  Location: [facility];  Service: Neurosurgery;  Laterality: Right;  Right Lumbar Five-Sacral One Diskectomy   throidectomy     TUBAL LIGATION     Family History  Problem Relation Age of Onset   Stroke Mother 47   Hypertension Mother    Stroke Father 70   Hypertension Father    Heart attack Sister 27       heart attack   Healthy Daughter    Breast cancer Maternal Aunt 70 - 48   Healthy Son    Healthy Son    Social History   Socioeconomic History   Marital status: Widowed    Spouse name: Not on file   Number of children: 3   Years of education: 67   Highest education level: Some college, no degree  Occupational History   Occupation: Retired    Comment: Investment banker, corporate work  Tobacco Use   Smoking status: Former    Current packs/day: 0.00    Average packs/day: 1 pack/day for 25.0 years (25.0 ttl pk-yrs)    Types: Cigarettes    Start date: 03/13/1953    Quit date: 03/13/1978    Years since quitting: 45.4   Smokeless tobacco: Never  Vaping Use   Vaping status: Never Used  Substance and Sexual Activity   Alcohol use: Yes    Comment: occasional glass of wine   Drug use: No   Sexual activity: Never  Other Topics Concern   Not on file  Social History Narrative   Nicole Wheeler is retired and lives alone.  She is widowed and has 3 grown children. She enjoys sewing, quilting, and word puzzles. She is involved in her church.    Social Drivers of Corporate investment banker Strain: Low Risk  (07/31/2023)   Overall Financial Resource Strain (CARDIA)    Difficulty of Paying Living Expenses: Not hard at all  Food Insecurity: No Food Insecurity (07/31/2023)   Hunger Vital Sign    Worried About Running Out of Food in the Last Year: Never true    Ran Out of Food in the Last Year: Never true  Transportation Needs: No Transportation Needs (07/31/2023)   PRAPARE - Administrator, Civil Service (Medical): No    Lack of Transportation (Non-Medical): No  Physical Activity: Insufficiently Active (07/31/2023)   Exercise Vital Sign    Days of Exercise per Week: 2 days    Minutes of Exercise per Session: 60 min  Stress: No Stress Concern Present (07/31/2023)   [employer] of Occupational Health - Occupational Stress Questionnaire    Feeling of Stress : Not at all  Social Connections: Unknown (07/31/2023)   Social Connection and Isolation Panel [NHANES]    Frequency of Communication with Friends and Family: Patient declined    Frequency of Social Gatherings with Friends and Family: Patient declined    Attends Religious Services: Not on Marketing executive or Organizations: Not on file    Attends Banker Meetings: Not on file    Marital Status: Not on file    Tobacco Counseling Counseling given: Yes    Clinical Intake:  Pre-visit preparation completed: Yes  Pain : 0-10 (back pain hurting from over working yesterday) Pain Score: 3  Pain Type: Other (Comment) (did some exerciss yesterday/just over did it per pt) Pain Onset: Yesterday Pain Relieving Factors: tylenol  Effect of Pain on Daily Activities: non  Pain Relieving Factors: tylenol   BMI - recorded: 29.48 Nutritional Status: BMI 25 -29 Overweight Nutritional Risks: None Diabetes: No  No results  found for: "HGBA1C"   How often do you need to have someone help you when you read instructions, pamphlets, or other written materials from your doctor or pharmacy?: 1 - Never  Interpreter Needed?: No  Information entered by :: Anola Basques T/CMA   Activities of Daily Living     07/31/2023   11:23 AM  In your present state of health, do you have any difficulty performing the following activities:  Hearing? 0  Vision? 0  Difficulty concentrating or making decisions? 0  Walking or climbing stairs? 0  Dressing or bathing? 0  Doing errands, shopping? 0  Preparing Food and eating ? N  Using the Toilet? N  In the past six months, have you accidently leaked urine? Y  Do you have problems with loss of bowel control? N  Managing your Medications? N  Managing your Finances? N  Housekeeping or managing your Housekeeping? N    Patient Care Team: Delfina Feller, FNP as PCP - General (Nurse Practitioner) Garry Kansas, MD as Consulting Physician (Neurosurgery) Maris Sickle, MD as Consulting Physician (Ophthalmology) Dorthey Gave, PA-C as Physician Assistant (Dermatology)  Indicate any recent Medical Services you may have received from other than [facility] providers in the past year (date may be approximate).     Assessment:    This is a routine wellness examination for Lucilia.  Hearing/Vision screen Hearing Screening - Comments:: Pt denies hearing dif Vision Screening - Comments:: pt wear glasses/pt goes to Dr. Fara Hone. [facility] in [place]   Goals Addressed             This Visit's Progress    DIET - INCREASE WATER INTAKE   On track      Depression Screen     07/31/2023   11:28 AM 04/09/2023    2:26 PM 10/05/2022    2:02 PM 10/05/2022    1:58 PM 07/28/2022    2:59 PM 04/06/2022    1:53 PM 10/03/2021   10:58 AM  PHQ 2/9 Scores  PHQ - 2 Score 0 0 0 0 0 0 0  PHQ- 9 Score   0 0  0 2    Fall Risk     07/31/2023   12:12 PM  04/09/2023    2:26 PM 10/05/2022    2:02  PM 10/05/2022    1:58 PM 07/28/2022    2:57 PM  Fall Risk   Falls in the past year? 0 0 0 0 0  Number falls in past yr: 0  0 0 0  Injury with Fall? 0   0 0  Risk for fall due to : No Fall Risks   No Fall Risks No Fall Risks  Follow up Falls evaluation completed   Falls evaluation completed;Education provided Falls prevention discussed    MEDICARE RISK AT HOME:  Medicare Risk at Home Any stairs in or around the home?: Yes If so, are there any without handrails?: Yes Home free of loose throw rugs in walkways, pet beds, electrical cords, etc?: Yes Adequate lighting in your home to reduce risk of falls?: Yes Life alert?: No Use of a cane, walker or w/c?: No Grab bars in the bathroom?: No (in the shower) Shower chair or bench in shower?: Yes Elevated toilet seat or a handicapped toilet?: No  TIMED UP AND GO:  Was the test performed?  no  Cognitive Function: 6CIT completed    09/20/2017   10:58 AM 09/19/2016   11:35 AM 08/17/2015   11:43 AM 08/17/2015   11:40 AM  MMSE - Mini Mental State Exam  Orientation to time 5 5 5 5   Orientation to Place 5 5 5 5   Registration 3 3 3    Attention/ Calculation 5 5 5    Recall 3 3 2    Language- name 2 objects 2 2 2    Language- repeat 1 1 1    Language- follow 3 step command 3 3 3    Language- read & follow direction 1 1 1    Write a sentence 1 1 1    Copy design 1 1 1    Total score 30 30 29          07/31/2023   11:28 AM 07/28/2022    3:01 PM 07/26/2021    2:16 PM 06/30/2019    2:35 PM  6CIT Screen  What Year? 0 points 0 points 0 points 0 points  What month? 0 points 0 points 0 points 0 points  What time? 0 points 0 points 0 points 0 points  Count back from 20 0 points 0 points 0 points 0 points  Months in reverse 0 points 0 points 0 points 0 points  Repeat phrase 0 points 0 points 0 points 0 points  Total Score 0 points 0 points 0 points 0 points    Immunizations Immunization History  Administered Date(s) Administered   Fluad Quad(high  Dose 65+) 12/21/2020, 12/14/2021   Fluad Trivalent(High Dose 65+) 12/07/2022   Influenza Split 12/08/2011, 12/14/2012   Influenza, High Dose Seasonal PF 01/08/2014   Influenza,inj,Quad PF,6+ Mos 12/11/2016   Influenza-Unspecified 01/13/2015, 01/01/2018, 12/16/2018, 01/21/2020   PFIZER(Purple Top)SARS-COV-2 Vaccination 04/04/2019, 04/25/2019, 01/10/2020, 07/23/2020   Pneumococcal Conjugate-13 06/15/2014   Pneumococcal Polysaccharide-23 03/13/1998   Tdap 04/05/2021   Zoster Recombinant(Shingrix) 10/01/2020, 04/05/2021    Screening Tests Health Maintenance  Topic Date Due   DEXA SCAN  05/29/2022   COVID-19 Vaccine (5 - 2024-25 season) 11/12/2022   INFLUENZA VACCINE  10/12/2023   MAMMOGRAM  02/05/2024   Medicare Annual Wellness (AWV)  07/30/2024   DTaP/Tdap/Td (2 - Td or Tdap) 04/06/2031   Pneumonia Vaccine 11+ Years old  Completed   Zoster Vaccines- Shingrix  Completed   HPV VACCINES  Aged Out   Meningococcal B Vaccine  Aged  Out    Health Maintenance  Health Maintenance Due  Topic Date Due   DEXA SCAN  05/29/2022   COVID-19 Vaccine (5 - 2024-25 season) 11/12/2022   Health Maintenance Items Addressed: See Nurse Notes  Additional Screening:  Vision Screening: Recommended annual ophthalmology exams for early detection of glaucoma and other disorders of the eye.  Dental Screening: Recommended annual dental exams for proper oral hygiene  Community Resource Referral / Chronic Care Management: CRR required this visit?  No   CCM required this visit?  No   Plan:    I have personally reviewed and noted the following in the patient's chart:   Medical and social history Use of alcohol, tobacco or illicit drugs  Current medications and supplements including opioid prescriptions. Patient is not currently taking opioid prescriptions. Functional ability and status Nutritional status Physical activity Advanced directives List of other physicians Hospitalizations, surgeries,  and ER visits in previous 12 months Vitals Screenings to include cognitive, depression, and falls Referrals and appointments  In addition, I have reviewed and discussed with patient certain preventive protocols, quality metrics, and best practice recommendations. A written personalized care plan for preventive services as well as general preventive health recommendations were provided to patient.   Michaelle Adolphus, CMA   07/31/2023   After Visit Summary: (Declined) Due to this being a telephonic visit, with patients personalized plan was offered to patient but patient Declined AVS at this time   Notes:FYI: Pt is aware she is not interested in doing the Dexa scan

## 2023-08-20 DIAGNOSIS — H0102B Squamous blepharitis left eye, upper and lower eyelids: Secondary | ICD-10-CM | POA: Diagnosis not present

## 2023-08-20 DIAGNOSIS — H0102A Squamous blepharitis right eye, upper and lower eyelids: Secondary | ICD-10-CM | POA: Diagnosis not present

## 2023-08-20 DIAGNOSIS — H04123 Dry eye syndrome of bilateral lacrimal glands: Secondary | ICD-10-CM | POA: Diagnosis not present

## 2023-08-20 DIAGNOSIS — H353132 Nonexudative age-related macular degeneration, bilateral, intermediate dry stage: Secondary | ICD-10-CM | POA: Diagnosis not present

## 2023-08-20 DIAGNOSIS — H401133 Primary open-angle glaucoma, bilateral, severe stage: Secondary | ICD-10-CM | POA: Diagnosis not present

## 2023-08-20 DIAGNOSIS — H02834 Dermatochalasis of left upper eyelid: Secondary | ICD-10-CM | POA: Diagnosis not present

## 2023-08-20 DIAGNOSIS — H02831 Dermatochalasis of right upper eyelid: Secondary | ICD-10-CM | POA: Diagnosis not present

## 2023-08-20 DIAGNOSIS — H353231 Exudative age-related macular degeneration, bilateral, with active choroidal neovascularization: Secondary | ICD-10-CM | POA: Diagnosis not present

## 2023-09-28 DIAGNOSIS — H353112 Nonexudative age-related macular degeneration, right eye, intermediate dry stage: Secondary | ICD-10-CM | POA: Diagnosis not present

## 2023-09-28 DIAGNOSIS — H401132 Primary open-angle glaucoma, bilateral, moderate stage: Secondary | ICD-10-CM | POA: Diagnosis not present

## 2023-09-28 DIAGNOSIS — H04123 Dry eye syndrome of bilateral lacrimal glands: Secondary | ICD-10-CM | POA: Diagnosis not present

## 2023-09-28 DIAGNOSIS — H02831 Dermatochalasis of right upper eyelid: Secondary | ICD-10-CM | POA: Diagnosis not present

## 2023-09-28 DIAGNOSIS — H02834 Dermatochalasis of left upper eyelid: Secondary | ICD-10-CM | POA: Diagnosis not present

## 2023-10-03 ENCOUNTER — Other Ambulatory Visit: Payer: Self-pay | Admitting: Nurse Practitioner

## 2023-10-03 DIAGNOSIS — E039 Hypothyroidism, unspecified: Secondary | ICD-10-CM

## 2023-10-08 DIAGNOSIS — H0102A Squamous blepharitis right eye, upper and lower eyelids: Secondary | ICD-10-CM | POA: Diagnosis not present

## 2023-10-08 DIAGNOSIS — H353231 Exudative age-related macular degeneration, bilateral, with active choroidal neovascularization: Secondary | ICD-10-CM | POA: Diagnosis not present

## 2023-10-08 DIAGNOSIS — H0102B Squamous blepharitis left eye, upper and lower eyelids: Secondary | ICD-10-CM | POA: Diagnosis not present

## 2023-10-08 DIAGNOSIS — H401133 Primary open-angle glaucoma, bilateral, severe stage: Secondary | ICD-10-CM | POA: Diagnosis not present

## 2023-10-08 DIAGNOSIS — H353132 Nonexudative age-related macular degeneration, bilateral, intermediate dry stage: Secondary | ICD-10-CM | POA: Diagnosis not present

## 2023-10-09 ENCOUNTER — Ambulatory Visit (INDEPENDENT_AMBULATORY_CARE_PROVIDER_SITE_OTHER): Payer: Medicare HMO | Admitting: Nurse Practitioner

## 2023-10-09 ENCOUNTER — Encounter: Payer: Self-pay | Admitting: Nurse Practitioner

## 2023-10-09 VITALS — BP 136/74 | HR 63 | Temp 98.3°F | Ht 61.0 in | Wt 155.0 lb

## 2023-10-09 DIAGNOSIS — E7800 Pure hypercholesterolemia, unspecified: Secondary | ICD-10-CM | POA: Diagnosis not present

## 2023-10-09 DIAGNOSIS — I10 Essential (primary) hypertension: Secondary | ICD-10-CM | POA: Diagnosis not present

## 2023-10-09 DIAGNOSIS — M8588 Other specified disorders of bone density and structure, other site: Secondary | ICD-10-CM

## 2023-10-09 DIAGNOSIS — E039 Hypothyroidism, unspecified: Secondary | ICD-10-CM | POA: Diagnosis not present

## 2023-10-09 DIAGNOSIS — Z6829 Body mass index (BMI) 29.0-29.9, adult: Secondary | ICD-10-CM | POA: Diagnosis not present

## 2023-10-09 MED ORDER — LEVOTHYROXINE SODIUM 100 MCG PO TABS: 100.0000 ug | ORAL_TABLET | Freq: Every day | ORAL | 1 refills | Status: DC

## 2023-10-09 MED ORDER — HYDROCHLOROTHIAZIDE 25 MG PO TABS: 25.0000 mg | ORAL_TABLET | Freq: Every day | ORAL | 1 refills | Status: DC

## 2023-10-09 MED ORDER — LOSARTAN POTASSIUM 100 MG PO TABS: 100.0000 mg | ORAL_TABLET | Freq: Every day | ORAL | 1 refills | Status: DC

## 2023-10-09 NOTE — Progress Notes (Signed)
 Subjective:    Patient ID: Nicole Wheeler, female    DOB: March 07, 1931, 89 y.o.   MRN: [id number]   Chief Complaint: medical management of chronic issues     HPI:  Nicole Wheeler is a 89 y.o. who identifies as a female who was assigned female at birth.   Social history: Lives with: by herself- family checks on her daily Work history: retired   Water engineer in today for follow up of the following chronic medical issues:  1. Primary hypertension No c/o chest pain, sob or headache. Does not check blood pressure at home. BP Readings from Last 3 Encounters:  07/31/23 (!) 158/80  04/09/23 (!) 158/80  10/05/22 (!) 144/76     2. Pure hypercholesterolemia Does try to watch diet but does no exercise. Refuses statin therapy Lab Results  Component Value Date   CHOL 201 (H) 04/09/2023   HDL 48 04/09/2023   LDLCALC 125 (H) 04/09/2023   TRIG 160 (H) 04/09/2023   CHOLHDL 4.2 04/09/2023     3. Acquired hypothyroidism No issues that she is aware of. Lab Results  Component Value Date   TSH 0.855 04/09/2023     4. Osteopenia of lumbar spine Last dexascan as done on 05/28/20. No longer wants to do dexascans due to age  3. BMI 29.0-29.9,adult No recent weight changes.  Wt Readings from Last 3 Encounters:  10/09/23 155 lb (70.3 kg)  07/31/23 156 lb (70.8 kg)  04/09/23 156 lb (70.8 kg)   BMI Readings from Last 3 Encounters:  10/09/23 29.29 kg/m  07/31/23 29.48 kg/m  04/09/23 29.48 kg/m           New complaints: none  Allergies  Allergen Reactions   Ace Inhibitors Cough   Crestor  [Rosuvastatin  Calcium ]     Myalgia    Lipitor [Atorvastatin ]     Myalgia    Outpatient Encounter Medications as of 10/09/2023  Medication Sig   aspirin EC 81 MG tablet Take 81 mg by mouth daily.   Calcium  Carbonate-Vit D-Min (CALCIUM  1200 PO) Take by mouth.   Cholecalciferol (VITAMIN D) 2000 units CAPS Take 1,000 Units by mouth daily.   diphenhydramine-acetaminophen  (TYLENOL  PM)  25-500 MG TABS tablet Take 0.5 tablets by mouth at bedtime as needed.    dorzolamide (TRUSOPT) 2 % ophthalmic solution dorzolamide 2 % eye drops  INSTILL 1 DROP INTO AFFECTED EYE(S) BY OPHTHALMIC ROUTE 3 TIMES PER DAY   fish oil-omega-3 fatty acids 1000 MG capsule Take 1 capsule by mouth daily.   fluticasone  (FLONASE ) 50 MCG/ACT nasal spray Place 2 sprays into both nostrils daily.   hydrochlorothiazide  (HYDRODIURIL ) 25 MG tablet Take 1 tablet (25 mg total) by mouth daily.   levothyroxine  (SYNTHROID ) 100 MCG tablet TAKE ONE TABLET DAILY   loratadine  (CLARITIN ) 10 MG tablet TAKE ONE TABLET BY MOUTH DAILY   losartan  (COZAAR ) 100 MG tablet Take 1 tablet (100 mg total) by mouth daily.   Multiple Vitamins-Minerals (CENTRUM SILVER PO) Take by mouth.   Multiple Vitamins-Minerals (PRESERVISION/LUTEIN PO) Take by mouth 2 (two) times daily.   ROCKLATAN 0.02-0.005 % SOLN Apply 1 drop to eye at bedtime.   triamcinolone  cream (KENALOG ) 0.1 % Apply 1 application topically 2 (two) times daily as needed.   No facility-administered encounter medications on file as of 10/09/2023.    Past Surgical History:  Procedure Laterality Date   APPENDECTOMY     EYE SURGERY Right    cataract removal   LUMBAR LAMINECTOMY/DECOMPRESSION MICRODISCECTOMY  12/07/2011  Procedure: LUMBAR LAMINECTOMY/DECOMPRESSION MICRODISCECTOMY 1 LEVEL;  Surgeon: Reyes JONETTA Budge, MD;  Location: [facility];  Service: Neurosurgery;  Laterality: Right;  Right Lumbar Five-Sacral One Diskectomy   throidectomy     TUBAL LIGATION      Family History  Problem Relation Age of Onset   Stroke Mother 92   Hypertension Mother    Stroke Father 27   Hypertension Father    Heart attack Sister 41       heart attack   Healthy Daughter    Breast cancer Maternal Aunt 27 - 18   Healthy Son    Healthy Son       Controlled substance contract: n/a     Review of Systems  Constitutional:  Negative for diaphoresis.  Eyes:  Negative for pain.   Respiratory:  Negative for shortness of breath.   Cardiovascular:  Negative for chest pain, palpitations and leg swelling.  Gastrointestinal:  Negative for abdominal pain.  Endocrine: Negative for polydipsia.  Skin:  Negative for rash.  Neurological:  Negative for dizziness, weakness and headaches.  Hematological:  Does not bruise/bleed easily.  All other systems reviewed and are negative.      Objective:   Physical Exam Vitals and nursing note reviewed.  Constitutional:      General: She is not in acute distress.    Appearance: Normal appearance. She is well-developed.  HENT:     Head: Normocephalic.     Right Ear: Tympanic membrane normal.     Left Ear: Tympanic membrane normal.     Nose: Nose normal.     Mouth/Throat:     Mouth: Mucous membranes are moist.  Eyes:     Pupils: Pupils are equal, round, and reactive to light.  Neck:     Vascular: No carotid bruit or JVD.  Cardiovascular:     Rate and Rhythm: Normal rate and regular rhythm.     Heart sounds: Normal heart sounds.  Pulmonary:     Effort: Pulmonary effort is normal. No respiratory distress.     Breath sounds: Normal breath sounds. No wheezing or rales.  Chest:     Chest wall: No tenderness.  Abdominal:     General: Bowel sounds are normal. There is no distension or abdominal bruit.     Palpations: Abdomen is soft. There is no hepatomegaly, splenomegaly, mass or pulsatile mass.     Tenderness: There is no abdominal tenderness.  Musculoskeletal:        General: Normal range of motion.     Cervical back: Normal range of motion and neck supple.  Lymphadenopathy:     Cervical: No cervical adenopathy.  Skin:    General: Skin is warm and dry.  Neurological:     Mental Status: She is alert and oriented to person, place, and time.     Deep Tendon Reflexes: Reflexes are normal and symmetric.  Psychiatric:        Behavior: Behavior normal.        Thought Content: Thought content normal.        Judgment:  Judgment normal.     BP 136/74   Pulse 63   Temp 98.3 F (36.8 C) (Temporal)   Ht 5' 1 (1.549 m)   Wt 155 lb (70.3 kg)   SpO2 97%   BMI 29.29 kg/m          Assessment & Plan:  Nicole Wheeler comes in today with chief complaint of medical management of chronic issues  Diagnosis and orders addressed:  1. Primary hypertension Low sodium diet - hydrochlorothiazide  (HYDRODIURIL ) 25 MG tablet; Take 1 tablet (25 mg total) by mouth daily.  Dispense: 90 tablet; Refill: 1 - losartan  (COZAAR ) 100 MG tablet; Take 1 tablet (100 mg total) by mouth daily.  Dispense: 90 tablet; Refill: 1 - CBC with Differential/Platelet - CMP14+EGFR  2. Pure hypercholesterolemia Low fat diet - Lipid panel  3. Acquired hypothyroidism Labs pending - levothyroxine  (SYNTHROID ) 100 MCG tablet; Take 1 tablet (100 mcg total) by mouth daily.  Dispense: 90 tablet; Refill: 1 - Thyroid  Panel With TSH  4. Osteopenia of lumbar spine Weight bearing exercise  5. BMI 29.0-29.9,adult Discussed diet and exercise for person with BMI >25 Will recheck weight in 3-6 months  Labs pending Health Maintenance reviewed Diet and exercise encouraged  Follow up plan: 6 months   Mary-Margaret Gladis, FNP

## 2023-10-09 NOTE — Patient Instructions (Signed)
 Bone Health Bones protect organs, store calcium, anchor muscles, and support the whole body. Keeping your bones strong is important, especially as you get older. You can take actions to help keep your bones strong and healthy. Why is keeping my bones healthy important?  Keeping your bones healthy is important because your body constantly replaces bone cells. Cells get old, and new cells take their place. As we age, we lose bone cells because the body may not be able to make enough new cells to replace the old cells. The amount of bone cells and bone tissue you have is referred to as bone mass. The higher your bone mass, the stronger your bones. The aging process leads to an overall loss of bone mass in the body, which can increase the likelihood of: Broken bones. A condition in which the bones become weak and brittle (osteoporosis). A large decline in bone mass occurs in older adults. In women, it occurs about the time of menopause. What actions can I take to keep my bones healthy? Good health habits are important for maintaining healthy bones. This includes eating nutritious foods and exercising regularly. To have healthy bones, you need to get enough of the right minerals and vitamins. Most nutrition experts recommend getting these nutrients from the foods that you eat. In some cases, taking supplements may also be recommended. Doing certain types of exercise is also important for bone health. What are the nutritional recommendations for healthy bones?  Eating a well-balanced diet with plenty of calcium and vitamin D will help to protect your bones. Nutritional recommendations vary from person to person. Ask your health care provider what is healthy for you. Here are some general guidelines. Get enough calcium Calcium is the most important (essential) mineral for bone health. Most people can get enough calcium from their diet, but supplements may be recommended for people who are at risk for  osteoporosis. Good sources of calcium include: Dairy products, such as low-fat or nonfat milk, cheese, and yogurt. Dark green leafy vegetables, such as bok choy and broccoli. Foods that have calcium added to them (are fortified). Foods that may be fortified with calcium include orange juice, cereal, bread, soy beverages, and tofu products. Nuts, such as almonds. Follow these recommended amounts for daily calcium intake: Infants, 0-6 months: 200 mg. Infants, 6-12 months: 260 mg. Children, age 647-3: 700 mg. Children, age 64-8: 1,000 mg. Children, age 642-13: 1,300 mg. Teens, age 38-18: 1,300 mg. Adults, age 88-50: 1,000 mg. Adults, age 23-70: Men: 1,000 mg. Women: 1,200 mg. Adults, age 97 or older: 1,200 mg. Pregnant and breastfeeding females: Teens: 1,300 mg. Adults: 1,000 mg. Get enough vitamin D Vitamin D is the most essential vitamin for bone health. It helps the body absorb calcium. Sunlight stimulates the skin to make vitamin D, so be sure to get enough sunlight. If you live in a cold climate or you do not get outside often, your health care provider may recommend that you take vitamin D supplements. Good sources of vitamin D in your diet include: Egg yolks. Saltwater fish. Milk and cereal fortified with vitamin D. Follow these recommended amounts for daily vitamin D intake: Infants, 0-12 months: 400 international units (IU). Children and teens, age 647-18: 600 international units. Adults, age 31 or younger: 600 international units. Adults, age 9 or older: 600-1,000 international units. Get other important nutrients Other nutrients that are important for bone health include: Phosphorus. This mineral is found in meat, poultry, dairy foods, nuts, and legumes. The  recommended daily intake for adult men and adult women is 700 mg. Magnesium. This mineral is found in seeds, nuts, dark green vegetables, and legumes. The recommended daily intake for adult men is 400-420 mg. For adult women,  it is 310-320 mg. Vitamin K. This vitamin is found in green leafy vegetables. The recommended daily intake is 120 mcg for adult men and 90 mcg for adult women. What type of physical activity is best for building and maintaining healthy bones? Weight-bearing and strength-building activities are important for building and maintaining healthy bones. Weight-bearing activities cause muscles and bones to work against gravity. Strength-building activities increase the strength of the muscles that support bones. Weight-bearing and muscle-building activities include: Walking and hiking. Jogging and running. Dancing. Gym exercises. Lifting weights. Tennis and racquetball. Climbing stairs. Aerobics. Adults should get at least 30 minutes of moderate physical activity on most days. Children should get at least 60 minutes of moderate physical activity on most days. Ask your health care provider what type of exercise is best for you. How can I find out if my bone mass is low? Bone mass can be measured with an X-ray test called a bone mineral density (BMD) test. This test is recommended for all women who are age 19 or older. It may also be recommended for: Men who are age 55 or older. People who are at risk for osteoporosis because of: Having a long-term disease that weakens bones, such as kidney disease or rheumatoid arthritis. Having menopause earlier than normal. Taking medicine that weakens bones, such as steroids, thyroid hormones, or hormone treatment for breast cancer or prostate cancer. Smoking. Drinking three or more alcoholic drinks a day. Being underweight. Sedentary lifestyle. If you find that you have a low bone mass, you may be able to prevent osteoporosis or further bone loss by changing your diet and lifestyle. Where can I find more information? Bone Health & Osteoporosis Foundation: https://carlson-fletcher.info/ [employer]: www.bones.http://www.myers.net/ International Osteoporosis  Foundation: Investment banker, operational.iofbonehealth.org Summary The aging process leads to an overall loss of bone mass in the body, which can increase the likelihood of broken bones and osteoporosis. Eating a well-balanced diet with plenty of calcium and vitamin D will help to protect your bones. Weight-bearing and strength-building activities are also important for building and maintaining strong bones. Bone mass can be measured with an X-ray test called a bone mineral density (BMD) test. This information is not intended to replace advice given to you by your health care provider. Make sure you discuss any questions you have with your health care provider. Document Revised: 08/11/2020 Document Reviewed: 08/11/2020 Elsevier Patient Education  2024 [employer].

## 2023-10-11 ENCOUNTER — Ambulatory Visit: Payer: Self-pay | Admitting: Nurse Practitioner

## 2023-10-12 LAB — CBC WITH DIFFERENTIAL/PLATELET
Basophils Absolute: 0.1 x10E3/uL (ref 0.0–0.2)
Basos: 1 %
EOS (ABSOLUTE): 0.2 x10E3/uL (ref 0.0–0.4)
Eos: 2 %
Hematocrit: 37.6 % (ref 34.0–46.6)
Hemoglobin: 11.8 g/dL (ref 11.1–15.9)
Immature Grans (Abs): 0 x10E3/uL (ref 0.0–0.1)
Immature Granulocytes: 0 %
Lymphocytes Absolute: 1.8 x10E3/uL (ref 0.7–3.1)
Lymphs: 17 %
MCH: 30 pg (ref 26.6–33.0)
MCHC: 31.4 g/dL — ABNORMAL LOW (ref 31.5–35.7)
MCV: 96 fL (ref 79–97)
Monocytes Absolute: 0.8 x10E3/uL (ref 0.1–0.9)
Monocytes: 8 %
Neutrophils Absolute: 7.2 x10E3/uL — ABNORMAL HIGH (ref 1.4–7.0)
Neutrophils: 72 %
Platelets: 336 x10E3/uL (ref 150–450)
RBC: 3.93 x10E6/uL (ref 3.77–5.28)
RDW: 12.1 % (ref 11.7–15.4)
WBC: 10.1 x10E3/uL (ref 3.4–10.8)

## 2023-10-12 LAB — CMP14+EGFR
ALT: 19 IU/L (ref 0–32)
AST: 24 IU/L (ref 0–40)
Albumin: 3.9 g/dL (ref 3.6–4.6)
Alkaline Phosphatase: 112 IU/L (ref 44–121)
BUN/Creatinine Ratio: 26 (ref 12–28)
BUN: 34 mg/dL (ref 10–36)
Bilirubin Total: <0.2 mg/dL (ref 0.0–1.2)
CO2: 23 mmol/L (ref 20–29)
Calcium: 9.9 mg/dL (ref 8.7–10.3)
Chloride: 99 mmol/L (ref 96–106)
Creatinine, Ser: 1.33 mg/dL — ABNORMAL HIGH (ref 0.57–1.00)
Globulin, Total: 2.8 g/dL (ref 1.5–4.5)
Glucose: 114 mg/dL — ABNORMAL HIGH (ref 70–99)
Potassium: 4.3 mmol/L (ref 3.5–5.2)
Sodium: 137 mmol/L (ref 134–144)
Total Protein: 6.7 g/dL (ref 6.0–8.5)
eGFR: 38 mL/min/1.73 — ABNORMAL LOW (ref >59)

## 2023-10-12 LAB — LIPID PANEL
Chol/HDL Ratio: 4.5 ratio — ABNORMAL HIGH (ref 0.0–4.4)
Cholesterol, Total: 194 mg/dL (ref 100–199)
HDL: 43 mg/dL (ref >39)
LDL Chol Calc (NIH): 126 mg/dL — ABNORMAL HIGH (ref 0–99)
Triglycerides: 138 mg/dL (ref 0–149)
VLDL Cholesterol Cal: 25 mg/dL (ref 5–40)

## 2023-10-12 LAB — THYROID PANEL WITH TSH
Free Thyroxine Index: 1.9 (ref 1.2–4.9)
T3 Uptake Ratio: 27 (ref 24–39)
T4, Total: 7.1 ug/dL (ref 4.5–12.0)
TSH: 1 u[IU]/mL (ref 0.450–4.500)

## 2023-12-03 DIAGNOSIS — H02831 Dermatochalasis of right upper eyelid: Secondary | ICD-10-CM | POA: Diagnosis not present

## 2023-12-03 DIAGNOSIS — H04123 Dry eye syndrome of bilateral lacrimal glands: Secondary | ICD-10-CM | POA: Diagnosis not present

## 2023-12-03 DIAGNOSIS — H401133 Primary open-angle glaucoma, bilateral, severe stage: Secondary | ICD-10-CM | POA: Diagnosis not present

## 2023-12-03 DIAGNOSIS — H0102B Squamous blepharitis left eye, upper and lower eyelids: Secondary | ICD-10-CM | POA: Diagnosis not present

## 2023-12-03 DIAGNOSIS — H02834 Dermatochalasis of left upper eyelid: Secondary | ICD-10-CM | POA: Diagnosis not present

## 2023-12-03 DIAGNOSIS — H0102A Squamous blepharitis right eye, upper and lower eyelids: Secondary | ICD-10-CM | POA: Diagnosis not present

## 2023-12-03 DIAGNOSIS — H353231 Exudative age-related macular degeneration, bilateral, with active choroidal neovascularization: Secondary | ICD-10-CM | POA: Diagnosis not present

## 2023-12-03 DIAGNOSIS — H353132 Nonexudative age-related macular degeneration, bilateral, intermediate dry stage: Secondary | ICD-10-CM | POA: Diagnosis not present

## 2024-02-01 DIAGNOSIS — H04123 Dry eye syndrome of bilateral lacrimal glands: Secondary | ICD-10-CM | POA: Diagnosis not present

## 2024-02-01 DIAGNOSIS — H02831 Dermatochalasis of right upper eyelid: Secondary | ICD-10-CM | POA: Diagnosis not present

## 2024-02-01 DIAGNOSIS — H401132 Primary open-angle glaucoma, bilateral, moderate stage: Secondary | ICD-10-CM | POA: Diagnosis not present

## 2024-02-01 DIAGNOSIS — Z961 Presence of intraocular lens: Secondary | ICD-10-CM | POA: Diagnosis not present

## 2024-02-01 DIAGNOSIS — H02834 Dermatochalasis of left upper eyelid: Secondary | ICD-10-CM | POA: Diagnosis not present

## 2024-02-04 DIAGNOSIS — H353211 Exudative age-related macular degeneration, right eye, with active choroidal neovascularization: Secondary | ICD-10-CM | POA: Diagnosis not present

## 2024-02-04 DIAGNOSIS — H353132 Nonexudative age-related macular degeneration, bilateral, intermediate dry stage: Secondary | ICD-10-CM | POA: Diagnosis not present

## 2024-02-04 DIAGNOSIS — H353221 Exudative age-related macular degeneration, left eye, with active choroidal neovascularization: Secondary | ICD-10-CM | POA: Diagnosis not present

## 2024-02-04 DIAGNOSIS — H0102A Squamous blepharitis right eye, upper and lower eyelids: Secondary | ICD-10-CM | POA: Diagnosis not present

## 2024-02-04 DIAGNOSIS — H401133 Primary open-angle glaucoma, bilateral, severe stage: Secondary | ICD-10-CM | POA: Diagnosis not present

## 2024-02-04 DIAGNOSIS — H0102B Squamous blepharitis left eye, upper and lower eyelids: Secondary | ICD-10-CM | POA: Diagnosis not present

## 2024-02-04 DIAGNOSIS — H04123 Dry eye syndrome of bilateral lacrimal glands: Secondary | ICD-10-CM | POA: Diagnosis not present

## 2024-02-04 DIAGNOSIS — H02834 Dermatochalasis of left upper eyelid: Secondary | ICD-10-CM | POA: Diagnosis not present

## 2024-02-04 DIAGNOSIS — H02831 Dermatochalasis of right upper eyelid: Secondary | ICD-10-CM | POA: Diagnosis not present

## 2024-02-13 ENCOUNTER — Other Ambulatory Visit: Payer: Self-pay | Admitting: Nurse Practitioner

## 2024-02-13 DIAGNOSIS — Z1231 Encounter for screening mammogram for malignant neoplasm of breast: Secondary | ICD-10-CM

## 2024-02-14 DIAGNOSIS — M7122 Synovial cyst of popliteal space [Baker], left knee: Secondary | ICD-10-CM | POA: Diagnosis not present

## 2024-02-14 DIAGNOSIS — M1712 Unilateral primary osteoarthritis, left knee: Secondary | ICD-10-CM | POA: Diagnosis not present

## 2024-04-03 ENCOUNTER — Ambulatory Visit

## 2024-04-04 ENCOUNTER — Ambulatory Visit (INDEPENDENT_AMBULATORY_CARE_PROVIDER_SITE_OTHER): Payer: Self-pay | Admitting: Nurse Practitioner

## 2024-04-04 ENCOUNTER — Encounter: Payer: Self-pay | Admitting: Nurse Practitioner

## 2024-04-04 VITALS — BP 138/82 | HR 66 | Temp 97.9°F | Ht 61.0 in | Wt 157.0 lb

## 2024-04-04 DIAGNOSIS — Z6829 Body mass index (BMI) 29.0-29.9, adult: Secondary | ICD-10-CM

## 2024-04-04 DIAGNOSIS — E7800 Pure hypercholesterolemia, unspecified: Secondary | ICD-10-CM

## 2024-04-04 DIAGNOSIS — I10 Essential (primary) hypertension: Secondary | ICD-10-CM

## 2024-04-04 DIAGNOSIS — M8588 Other specified disorders of bone density and structure, other site: Secondary | ICD-10-CM

## 2024-04-04 DIAGNOSIS — E039 Hypothyroidism, unspecified: Secondary | ICD-10-CM

## 2024-04-04 MED ORDER — LOSARTAN POTASSIUM 100 MG PO TABS: 100.0000 mg | ORAL_TABLET | Freq: Every day | ORAL | 1 refills | Status: AC

## 2024-04-04 MED ORDER — HYDROCHLOROTHIAZIDE 25 MG PO TABS: 25.0000 mg | ORAL_TABLET | Freq: Every day | ORAL | 1 refills | Status: AC

## 2024-04-04 MED ORDER — LEVOTHYROXINE SODIUM 100 MCG PO TABS: 100.0000 ug | ORAL_TABLET | Freq: Every day | ORAL | 1 refills | Status: AC

## 2024-04-04 NOTE — Patient Instructions (Signed)
 Fall Prevention in the Home, Adult Falls can cause injuries and can happen to people of all ages. There are many things you can do to make your home safer and to help prevent falls. What actions can I take to prevent falls? General information Use good lighting in all rooms. Make sure to: Replace any light bulbs that burn out. Turn on the lights in dark areas and use night-lights. Keep items that you use often in easy-to-reach places. Lower the shelves around your home if needed. Move furniture so that there are clear paths around it. Do not use throw rugs or other things on the floor that can make you trip. If any of your floors are uneven, fix them. Add color or contrast paint or tape to clearly mark and help you see: Grab bars or handrails. First and last steps of staircases. Where the edge of each step is. If you use a ladder or stepladder: Make sure that it is fully opened. Do not climb a closed ladder. Make sure the sides of the ladder are locked in place. Have someone hold the ladder while you use it. Know where your pets are as you move through your home. What can I do in the bathroom?     Keep the floor dry. Clean up any water on the floor right away. Remove soap buildup in the bathtub or shower. Buildup makes bathtubs and showers slippery. Use non-skid mats or decals on the floor of the bathtub or shower. Attach bath mats securely with double-sided, non-slip rug tape. If you need to sit down in the shower, use a non-slip stool. Install grab bars by the toilet and in the bathtub and shower. Do not use towel bars as grab bars. What can I do in the bedroom? Make sure that you have a light by your bed that is easy to reach. Do not use any sheets or blankets on your bed that hang to the floor. Have a firm chair or bench with side arms that you can use for support when you get dressed. What can I do in the kitchen? Clean up any spills right away. If you need to reach something  above you, use a step stool with a grab bar. Keep electrical cords out of the way. Do not use floor polish or wax that makes floors slippery. What can I do with my stairs? Do not leave anything on the stairs. Make sure that you have a light switch at the top and the bottom of the stairs. Make sure that there are handrails on both sides of the stairs. Fix handrails that are broken or loose. Install non-slip stair treads on all your stairs if they do not have carpet. Avoid having throw rugs at the top or bottom of the stairs. Choose a carpet that does not hide the edge of the steps on the stairs. Make sure that the carpet is firmly attached to the stairs. Fix carpet that is loose or worn. What can I do on the outside of my home? Use bright outdoor lighting. Fix the edges of walkways and driveways and fix any cracks. Clear paths of anything that can make you trip, such as tools or rocks. Add color or contrast paint or tape to clearly mark and help you see anything that might make you trip as you walk through a door, such as a raised step or threshold. Trim any bushes or trees on paths to your home. Check to see if handrails are loose  or broken and that both sides of all steps have handrails. Install guardrails along the edges of any raised decks and porches. Have leaves, snow, or ice cleared regularly. Use sand, salt, or ice melter on paths if you live where there is ice and snow during the winter. Clean up any spills in your garage right away. This includes grease or oil spills. What other actions can I take? Review your medicines with your doctor. Some medicines can cause dizziness or changes in blood pressure, which increase your risk of falling. Wear shoes that: Have a low heel. Do not wear high heels. Have rubber bottoms and are closed at the toe. Feel good on your feet and fit well. Use tools that help you move around if needed. These include: Canes. Walkers. Scooters. Crutches. Ask  your doctor what else you can do to help prevent falls. This may include seeing a physical therapist to learn to do exercises to move better and get stronger. Where to find more information Centers for Disease Control and Prevention, [employer]: TonerPromos.no [employer] on Aging: BaseRingTones.pl National Institute on Aging: BaseRingTones.pl Contact a doctor if: You are afraid of falling at home. You feel weak, drowsy, or dizzy at home. You fall at home. Get help right away if you: Lose consciousness or have trouble moving after a fall. Have a fall that causes a head injury. These symptoms may be an emergency. Get help right away. Call 911. Do not wait to see if the symptoms will go away. Do not drive yourself to the hospital. This information is not intended to replace advice given to you by your health care provider. Make sure you discuss any questions you have with your health care provider. Document Revised: 10/31/2021 Document Reviewed: 10/31/2021 Elsevier Patient Education  2024 [employer].

## 2024-04-04 NOTE — Progress Notes (Signed)
 "  Subjective:    Patient ID: Nicole Wheeler, female    DOB: 1931/01/11, 89 y.o.   MRN: [id number]   Chief Complaint: medical management of chronic issues     HPI:  Nicole Wheeler is a 89 y.o. who identifies as a female who was assigned female at birth.   Social history: Lives with: by herself- family checks on her daily Work history: retired   Water Engineer in today for follow up of the following chronic medical issues:  1. Primary hypertension No c/o chest pain, sob or headache. Does not check blood pressure at home. BP Readings from Last 3 Encounters:  10/09/23 136/74  07/31/23 (!) 158/80  04/09/23 (!) 158/80     2. Pure hypercholesterolemia Does try to watch diet but does no exercise. Refuses statin therapy Lab Results  Component Value Date   CHOL 194 10/09/2023   HDL 43 10/09/2023   LDLCALC 126 (H) 10/09/2023   TRIG 138 10/09/2023   CHOLHDL 4.5 (H) 10/09/2023     3. Acquired hypothyroidism No issues that she is aware of. Lab Results  Component Value Date   TSH 1.000 10/09/2023     4. Osteopenia of lumbar spine Last dexascan as done on 05/28/20. No longer wants to do dexascans due to age  89. BMI 29.0-29.9,adult No recent weight changes.  Wt Readings from Last 3 Encounters:  04/04/24 157 lb (71.2 kg)  10/09/23 155 lb (70.3 kg)  07/31/23 156 lb (70.8 kg)   BMI Readings from Last 3 Encounters:  04/04/24 29.66 kg/m  10/09/23 29.29 kg/m  07/31/23 29.48 kg/m             New complaints: none  Allergies  Allergen Reactions   Ace Inhibitors Cough   Crestor  [Rosuvastatin  Calcium ]     Myalgia    Lipitor [Atorvastatin ]     Myalgia    Outpatient Encounter Medications as of 04/04/2024  Medication Sig   aspirin EC 81 MG tablet Take 81 mg by mouth daily.   Calcium  Carbonate-Vit D-Min (CALCIUM  1200 PO) Take by mouth.   Cholecalciferol (VITAMIN D) 2000 units CAPS Take 1,000 Units by mouth daily.   diphenhydramine-acetaminophen  (TYLENOL  PM) 25-500  MG TABS tablet Take 0.5 tablets by mouth at bedtime as needed.    dorzolamide (TRUSOPT) 2 % ophthalmic solution dorzolamide 2 % eye drops  INSTILL 1 DROP INTO AFFECTED EYE(S) BY OPHTHALMIC ROUTE 3 TIMES PER DAY   fish oil-omega-3 fatty acids 1000 MG capsule Take 1 capsule by mouth daily.   fluticasone  (FLONASE ) 50 MCG/ACT nasal spray Place 2 sprays into both nostrils daily.   hydrochlorothiazide  (HYDRODIURIL ) 25 MG tablet Take 1 tablet (25 mg total) by mouth daily.   levothyroxine  (SYNTHROID ) 100 MCG tablet Take 1 tablet (100 mcg total) by mouth daily.   loratadine  (CLARITIN ) 10 MG tablet TAKE ONE TABLET BY MOUTH DAILY   losartan  (COZAAR ) 100 MG tablet Take 1 tablet (100 mg total) by mouth daily.   Multiple Vitamins-Minerals (CENTRUM SILVER PO) Take by mouth.   Multiple Vitamins-Minerals (PRESERVISION/LUTEIN PO) Take by mouth 2 (two) times daily.   ROCKLATAN 0.02-0.005 % SOLN Apply 1 drop to eye at bedtime.   triamcinolone  cream (KENALOG ) 0.1 % Apply 1 application topically 2 (two) times daily as needed.   No facility-administered encounter medications on file as of 04/04/2024.    Past Surgical History:  Procedure Laterality Date   APPENDECTOMY     EYE SURGERY Right    cataract removal   LUMBAR  LAMINECTOMY/DECOMPRESSION MICRODISCECTOMY  12/07/2011   Procedure: LUMBAR LAMINECTOMY/DECOMPRESSION MICRODISCECTOMY 1 LEVEL;  Surgeon: Reyes JONETTA Budge, MD;  Location: [facility];  Service: Neurosurgery;  Laterality: Right;  Right Lumbar Five-Sacral One Diskectomy   throidectomy     TUBAL LIGATION      Family History  Problem Relation Age of Onset   Stroke Mother 11   Hypertension Mother    Stroke Father 94   Hypertension Father    Heart attack Sister 92       heart attack   Healthy Daughter    Breast cancer Maternal Aunt 27 - 70   Healthy Son    Healthy Son       Controlled substance contract: n/a     Review of Systems  Constitutional:  Negative for diaphoresis.  Eyes:   Negative for pain.  Respiratory:  Negative for shortness of breath.   Cardiovascular:  Negative for chest pain, palpitations and leg swelling.  Gastrointestinal:  Negative for abdominal pain.  Endocrine: Negative for polydipsia.  Skin:  Negative for rash.  Neurological:  Negative for dizziness, weakness and headaches.  Hematological:  Does not bruise/bleed easily.  All other systems reviewed and are negative.      Objective:   Physical Exam Vitals and nursing note reviewed.  Constitutional:      General: She is not in acute distress.    Appearance: Normal appearance. She is well-developed.  HENT:     Head: Normocephalic.     Right Ear: Tympanic membrane normal.     Left Ear: Tympanic membrane normal.     Nose: Nose normal.     Mouth/Throat:     Mouth: Mucous membranes are moist.  Eyes:     Pupils: Pupils are equal, round, and reactive to light.  Neck:     Vascular: No carotid bruit or JVD.  Cardiovascular:     Rate and Rhythm: Normal rate and regular rhythm.     Heart sounds: Normal heart sounds.  Pulmonary:     Effort: Pulmonary effort is normal. No respiratory distress.     Breath sounds: Normal breath sounds. No wheezing or rales.  Chest:     Chest wall: No tenderness.  Abdominal:     General: Bowel sounds are normal. There is no distension or abdominal bruit.     Palpations: Abdomen is soft. There is no hepatomegaly, splenomegaly, mass or pulsatile mass.     Tenderness: There is no abdominal tenderness.  Musculoskeletal:        General: Normal range of motion.     Cervical back: Normal range of motion and neck supple.  Lymphadenopathy:     Cervical: No cervical adenopathy.  Skin:    General: Skin is warm and dry.  Neurological:     Mental Status: She is alert and oriented to person, place, and time.     Deep Tendon Reflexes: Reflexes are normal and symmetric.  Psychiatric:        Behavior: Behavior normal.        Thought Content: Thought content normal.         Judgment: Judgment normal.     BP 138/82   Pulse 66   Temp 97.9 F (36.6 C) (Temporal)   Ht 5' 1 (1.549 m)   Wt 157 lb (71.2 kg)   SpO2 99%   BMI 29.66 kg/m         Assessment & Plan:  Nicole Wheeler comes in today with chief complaint of medical  management of chronic issues    Diagnosis and orders addressed:  1. Primary hypertension Low sodium diet - hydrochlorothiazide  (HYDRODIURIL ) 25 MG tablet; Take 1 tablet (25 mg total) by mouth daily.  Dispense: 90 tablet; Refill: 1 - losartan  (COZAAR ) 100 MG tablet; Take 1 tablet (100 mg total) by mouth daily.  Dispense: 90 tablet; Refill: 1 - CBC with Differential/Platelet - CMP14+EGFR  2. Pure hypercholesterolemia Low fat diet - Lipid panel  3. Acquired hypothyroidism Labs pending - levothyroxine  (SYNTHROID ) 100 MCG tablet; Take 1 tablet (100 mcg total) by mouth daily.  Dispense: 90 tablet; Refill: 1 - Thyroid  Panel With TSH  4. Osteopenia of lumbar spine Weight bearing exercise  5. BMI 29.0-29.9,adult Discussed diet and exercise for person with BMI >25 Will recheck weight in 3-6 months  Labs pending Health Maintenance reviewed Diet and exercise encouraged  Follow up plan: 6 months   Mary-Margaret Gladis, FNP  "

## 2024-04-05 LAB — CBC WITH DIFFERENTIAL/PLATELET
Basophils Absolute: 0.1 10*3/uL (ref 0.0–0.2)
Basos: 1 %
EOS (ABSOLUTE): 0.3 10*3/uL (ref 0.0–0.4)
Eos: 4 %
Hematocrit: 37.1 % (ref 34.0–46.6)
Hemoglobin: 11.8 g/dL (ref 11.1–15.9)
Immature Grans (Abs): 0 10*3/uL (ref 0.0–0.1)
Immature Granulocytes: 0 %
Lymphocytes Absolute: 1.7 10*3/uL (ref 0.7–3.1)
Lymphs: 22 %
MCH: 29.9 pg (ref 26.6–33.0)
MCHC: 31.8 g/dL (ref 31.5–35.7)
MCV: 94 fL (ref 79–97)
Monocytes Absolute: 0.7 10*3/uL (ref 0.1–0.9)
Monocytes: 8 %
Neutrophils Absolute: 5.1 10*3/uL (ref 1.4–7.0)
Neutrophils: 65 %
Platelets: 275 10*3/uL (ref 150–450)
RBC: 3.94 x10E6/uL (ref 3.77–5.28)
RDW: 13 % (ref 11.7–15.4)
WBC: 7.8 10*3/uL (ref 3.4–10.8)

## 2024-04-05 LAB — CMP14+EGFR
ALT: 22 [IU]/L (ref 0–32)
AST: 27 [IU]/L (ref 0–40)
Albumin: 4.2 g/dL (ref 3.6–4.6)
Alkaline Phosphatase: 107 [IU]/L (ref 48–129)
BUN/Creatinine Ratio: 30 — ABNORMAL HIGH (ref 12–28)
BUN: 42 mg/dL — ABNORMAL HIGH (ref 10–36)
Bilirubin Total: 0.2 mg/dL (ref 0.0–1.2)
CO2: 23 mmol/L (ref 20–29)
Calcium: 9.7 mg/dL (ref 8.7–10.3)
Chloride: 102 mmol/L (ref 96–106)
Creatinine, Ser: 1.38 mg/dL — ABNORMAL HIGH (ref 0.57–1.00)
Globulin, Total: 2.4 g/dL (ref 1.5–4.5)
Glucose: 104 mg/dL — ABNORMAL HIGH (ref 70–99)
Potassium: 4.5 mmol/L (ref 3.5–5.2)
Sodium: 140 mmol/L (ref 134–144)
Total Protein: 6.6 g/dL (ref 6.0–8.5)
eGFR: 36 mL/min/{1.73_m2} — ABNORMAL LOW

## 2024-04-05 LAB — LIPID PANEL
Chol/HDL Ratio: 4 ratio (ref 0.0–4.4)
Cholesterol, Total: 196 mg/dL (ref 100–199)
HDL: 49 mg/dL
LDL Chol Calc (NIH): 123 mg/dL — ABNORMAL HIGH (ref 0–99)
Triglycerides: 137 mg/dL (ref 0–149)
VLDL Cholesterol Cal: 24 mg/dL (ref 5–40)

## 2024-04-05 LAB — THYROID PANEL WITH TSH
Free Thyroxine Index: 2.8 (ref 1.2–4.9)
T3 Uptake Ratio: 34 % (ref 24–39)
T4, Total: 8.3 ug/dL (ref 4.5–12.0)
TSH: 0.809 u[IU]/mL (ref 0.450–4.500)

## 2024-04-05 LAB — VITAMIN D 25 HYDROXY (VIT D DEFICIENCY, FRACTURES): Vit D, 25-Hydroxy: 77.1 ng/mL (ref 30.0–100.0)

## 2024-04-08 ENCOUNTER — Ambulatory Visit

## 2024-04-08 ENCOUNTER — Ambulatory Visit: Payer: Self-pay | Admitting: Nurse Practitioner

## 2024-04-21 ENCOUNTER — Inpatient Hospital Stay: Admission: RE | Admit: 2024-04-21 | Source: Ambulatory Visit

## 2024-10-07 ENCOUNTER — Ambulatory Visit: Admitting: Nurse Practitioner
